# Patient Record
Sex: Male | Born: 1935 | Race: White | Hispanic: No | Marital: Married | Smoking: Former smoker
Health system: Southern US, Community
[De-identification: ages and names within clinical notes are randomized; demographics above are authoritative.]

## PROBLEM LIST (undated history)

## (undated) DIAGNOSIS — S065XAA Traumatic subdural hemorrhage with loss of consciousness status unknown, initial encounter: Secondary | ICD-10-CM

## (undated) DIAGNOSIS — M25511 Pain in right shoulder: Secondary | ICD-10-CM

## (undated) DIAGNOSIS — D696 Thrombocytopenia, unspecified: Secondary | ICD-10-CM

## (undated) DIAGNOSIS — M1990 Unspecified osteoarthritis, unspecified site: Secondary | ICD-10-CM

## (undated) DIAGNOSIS — I4891 Unspecified atrial fibrillation: Principal | ICD-10-CM

## (undated) DIAGNOSIS — M549 Dorsalgia, unspecified: Secondary | ICD-10-CM

## (undated) DIAGNOSIS — I639 Cerebral infarction, unspecified: Secondary | ICD-10-CM

## (undated) DIAGNOSIS — J449 Chronic obstructive pulmonary disease, unspecified: Secondary | ICD-10-CM

## (undated) DIAGNOSIS — I495 Sick sinus syndrome: Secondary | ICD-10-CM

## (undated) DIAGNOSIS — R079 Chest pain, unspecified: Secondary | ICD-10-CM

## (undated) DIAGNOSIS — M25512 Pain in left shoulder: Secondary | ICD-10-CM

## (undated) HISTORY — DX: Dorsalgia, unspecified: M54.9

## (undated) HISTORY — DX: Thrombocytopenia, unspecified: D69.6

## (undated) HISTORY — DX: Pain in right shoulder: M25.511

## (undated) HISTORY — DX: Sick sinus syndrome: I49.5

## (undated) HISTORY — DX: Pain in left shoulder: M25.512

## (undated) HISTORY — PX: PACEMAKER IMPLANT: EP1218

## (undated) HISTORY — PX: CATARACT EXTRACTION W/ INTRAOCULAR LENS IMPLANT: SHX1309

## (undated) HISTORY — PX: TOTAL HIP ARTHROPLASTY: SHX124

---

## 2012-05-15 ENCOUNTER — Encounter (HOSPITAL_BASED_OUTPATIENT_CLINIC_OR_DEPARTMENT_OTHER): Payer: Self-pay

## 2012-05-15 ENCOUNTER — Inpatient Hospital Stay (HOSPITAL_COMMUNITY): Payer: Medicare Other

## 2012-05-15 ENCOUNTER — Inpatient Hospital Stay (HOSPITAL_BASED_OUTPATIENT_CLINIC_OR_DEPARTMENT_OTHER)
Admission: EM | Admit: 2012-05-15 | Discharge: 2012-05-18 | DRG: 309 | Disposition: A | Discharge status: Home / Self Care | Payer: Medicare Other | Attending: Internal Medicine | Admitting: Internal Medicine

## 2012-05-15 ENCOUNTER — Emergency Department (HOSPITAL_BASED_OUTPATIENT_CLINIC_OR_DEPARTMENT_OTHER): Payer: Medicare Other

## 2012-05-15 DIAGNOSIS — R0781 Pleurodynia: Secondary | ICD-10-CM

## 2012-05-15 DIAGNOSIS — R7309 Other abnormal glucose: Secondary | ICD-10-CM | POA: Diagnosis present

## 2012-05-15 DIAGNOSIS — Z87891 Personal history of nicotine dependence: Secondary | ICD-10-CM

## 2012-05-15 DIAGNOSIS — N179 Acute kidney failure, unspecified: Secondary | ICD-10-CM | POA: Diagnosis present

## 2012-05-15 DIAGNOSIS — Z9849 Cataract extraction status, unspecified eye: Secondary | ICD-10-CM

## 2012-05-15 DIAGNOSIS — J449 Chronic obstructive pulmonary disease, unspecified: Secondary | ICD-10-CM | POA: Diagnosis present

## 2012-05-15 DIAGNOSIS — Z96649 Presence of unspecified artificial hip joint: Secondary | ICD-10-CM

## 2012-05-15 DIAGNOSIS — D696 Thrombocytopenia, unspecified: Secondary | ICD-10-CM | POA: Diagnosis present

## 2012-05-15 DIAGNOSIS — Z23 Encounter for immunization: Secondary | ICD-10-CM

## 2012-05-15 DIAGNOSIS — I517 Cardiomegaly: Secondary | ICD-10-CM

## 2012-05-15 DIAGNOSIS — J4489 Other specified chronic obstructive pulmonary disease: Secondary | ICD-10-CM | POA: Diagnosis present

## 2012-05-15 DIAGNOSIS — Z7982 Long term (current) use of aspirin: Secondary | ICD-10-CM

## 2012-05-15 DIAGNOSIS — M25519 Pain in unspecified shoulder: Secondary | ICD-10-CM

## 2012-05-15 DIAGNOSIS — M25512 Pain in left shoulder: Secondary | ICD-10-CM | POA: Diagnosis present

## 2012-05-15 DIAGNOSIS — M549 Dorsalgia, unspecified: Secondary | ICD-10-CM | POA: Diagnosis present

## 2012-05-15 DIAGNOSIS — M25511 Pain in right shoulder: Secondary | ICD-10-CM | POA: Diagnosis present

## 2012-05-15 DIAGNOSIS — Z961 Presence of intraocular lens: Secondary | ICD-10-CM

## 2012-05-15 DIAGNOSIS — D72829 Elevated white blood cell count, unspecified: Secondary | ICD-10-CM | POA: Diagnosis present

## 2012-05-15 DIAGNOSIS — I739 Peripheral vascular disease, unspecified: Secondary | ICD-10-CM | POA: Diagnosis present

## 2012-05-15 DIAGNOSIS — I4891 Unspecified atrial fibrillation: Secondary | ICD-10-CM | POA: Diagnosis present

## 2012-05-15 HISTORY — DX: Chronic obstructive pulmonary disease, unspecified: J44.9

## 2012-05-15 HISTORY — DX: Unspecified atrial fibrillation: I48.91

## 2012-05-15 HISTORY — DX: Unspecified osteoarthritis, unspecified site: M19.90

## 2012-05-15 LAB — CBC WITH DIFFERENTIAL/PLATELET
Basophils Absolute: 0 10*3/uL (ref 0.0–0.1)
Basophils Relative: 0 % (ref 0–1)
Eosinophils Absolute: 0.1 10*3/uL (ref 0.0–0.7)
Eosinophils Relative: 1 % (ref 0–5)
HCT: 44.8 % (ref 39.0–52.0)
Lymphocytes Relative: 15 % (ref 12–46)
MCHC: 34.8 g/dL (ref 30.0–36.0)
MCV: 95.3 fL (ref 78.0–100.0)
Monocytes Absolute: 1.1 10*3/uL — ABNORMAL HIGH (ref 0.1–1.0)
Platelets: 142 10*3/uL — ABNORMAL LOW (ref 150–400)
RDW: 13.2 % (ref 11.5–15.5)
WBC: 15.4 10*3/uL — ABNORMAL HIGH (ref 4.0–10.5)

## 2012-05-15 LAB — PROTIME-INR
INR: 1.02 (ref 0.00–1.49)
Prothrombin Time: 13.3 seconds (ref 11.6–15.2)

## 2012-05-15 LAB — URINALYSIS, ROUTINE W REFLEX MICROSCOPIC
Hgb urine dipstick: NEGATIVE
Nitrite: NEGATIVE
Protein, ur: NEGATIVE mg/dL
Specific Gravity, Urine: 1.021 (ref 1.005–1.030)
Urobilinogen, UA: 1 mg/dL (ref 0.0–1.0)

## 2012-05-15 LAB — CK TOTAL AND CKMB (NOT AT ARMC)
CK, MB: 3.6 ng/mL (ref 0.3–4.0)
Relative Index: 1.6 (ref 0.0–2.5)
Total CK: 230 U/L (ref 7–232)

## 2012-05-15 LAB — COMPREHENSIVE METABOLIC PANEL
ALT: 10 U/L (ref 0–53)
AST: 17 U/L (ref 0–37)
Albumin: 3.9 g/dL (ref 3.5–5.2)
Calcium: 9.1 mg/dL (ref 8.4–10.5)
Creatinine, Ser: 1.2 mg/dL (ref 0.50–1.35)
GFR calc non Af Amer: 52 mL/min — ABNORMAL LOW (ref >90)
Sodium: 138 mEq/L (ref 135–145)
Total Protein: 7.1 g/dL (ref 6.0–8.3)

## 2012-05-15 LAB — MAGNESIUM: Magnesium: 1.9 mg/dL (ref 1.5–2.5)

## 2012-05-15 LAB — MRSA PCR SCREENING: MRSA by PCR: NEGATIVE

## 2012-05-15 LAB — HEMOGLOBIN A1C: Hgb A1c MFr Bld: 5.6 % (ref <5.7)

## 2012-05-15 LAB — TROPONIN I: Troponin I: <0.3 ng/mL (ref <0.30)

## 2012-05-15 MED ORDER — IOHEXOL 350 MG/ML SOLN
100.0000 mL | Freq: Once | INTRAVENOUS | Status: AC | PRN
  Administered 2012-05-15: 100 mL via INTRAVENOUS

## 2012-05-15 MED ORDER — DILTIAZEM HCL 25 MG/5ML IV SOLN
20.0000 mg | Freq: Once | INTRAVENOUS | Status: AC
  Administered 2012-05-15: 20 mg via INTRAVENOUS
  Filled 2012-05-15: qty 5

## 2012-05-15 MED ORDER — ENOXAPARIN SODIUM 40 MG/0.4ML ~~LOC~~ SOLN
40.0000 mg | Freq: Every day | SUBCUTANEOUS | Status: DC
  Filled 2012-05-15: qty 0.4

## 2012-05-15 MED ORDER — DILTIAZEM HCL 100 MG IV SOLR
5.0000 mg/h | Freq: Once | INTRAVENOUS | Status: AC
  Administered 2012-05-15: 5 mg/h via INTRAVENOUS

## 2012-05-15 MED ORDER — DILTIAZEM HCL 100 MG IV SOLR
10.0000 mg/h | Freq: Once | INTRAVENOUS | Status: AC
  Administered 2012-05-15: 15 mg/h via INTRAVENOUS

## 2012-05-15 MED ORDER — METOPROLOL TARTRATE 25 MG PO TABS
25.0000 mg | ORAL_TABLET | Freq: Three times a day (TID) | ORAL | Status: DC
  Administered 2012-05-15: 25 mg via ORAL
  Filled 2012-05-15: qty 1

## 2012-05-15 MED ORDER — DILTIAZEM HCL 25 MG/5ML IV SOLN
INTRAVENOUS | Status: AC
  Filled 2012-05-15: qty 20

## 2012-05-15 MED ORDER — SODIUM CHLORIDE 0.9 % IV SOLN
INTRAVENOUS | Status: DC
  Administered 2012-05-15 – 2012-05-16 (×2): via INTRAVENOUS

## 2012-05-15 MED ORDER — ACETAMINOPHEN 325 MG PO TABS
650.0000 mg | ORAL_TABLET | Freq: Four times a day (QID) | ORAL | Status: DC | PRN
  Administered 2012-05-15: 650 mg via ORAL
  Filled 2012-05-15: qty 2

## 2012-05-15 MED ORDER — IBUPROFEN 600 MG PO TABS
600.0000 mg | ORAL_TABLET | Freq: Four times a day (QID) | ORAL | Status: DC | PRN
  Administered 2012-05-15: 600 mg via ORAL
  Filled 2012-05-15: qty 1

## 2012-05-15 MED ORDER — DILTIAZEM HCL 25 MG/5ML IV SOLN
25.0000 mg | Freq: Once | INTRAVENOUS | Status: DC
  Filled 2012-05-15: qty 5

## 2012-05-15 MED ORDER — ASPIRIN EC 325 MG PO TBEC
325.0000 mg | DELAYED_RELEASE_TABLET | Freq: Every day | ORAL | Status: DC
  Administered 2012-05-15: 325 mg via ORAL
  Filled 2012-05-15: qty 1

## 2012-05-15 MED ORDER — BIOTENE DRY MOUTH MT LIQD
15.0000 mL | Freq: Two times a day (BID) | OROMUCOSAL | Status: DC
  Administered 2012-05-15 – 2012-05-18 (×7): 15 mL via OROMUCOSAL

## 2012-05-15 MED ORDER — APIXABAN 2.5 MG PO TABS
2.5000 mg | ORAL_TABLET | Freq: Two times a day (BID) | ORAL | Status: DC
  Administered 2012-05-15 – 2012-05-18 (×7): 2.5 mg via ORAL
  Filled 2012-05-15 (×8): qty 1

## 2012-05-15 MED ORDER — ONDANSETRON HCL 4 MG PO TABS: 4.0000 mg | ORAL_TABLET | Freq: Four times a day (QID) | ORAL | Status: DC | PRN

## 2012-05-15 MED ORDER — DILTIAZEM HCL 100 MG IV SOLR
5.0000 mg/h | INTRAVENOUS | Status: DC
  Filled 2012-05-15: qty 100

## 2012-05-15 MED ORDER — SODIUM CHLORIDE 0.9 % IJ SOLN
3.0000 mL | Freq: Two times a day (BID) | INTRAMUSCULAR | Status: DC
  Administered 2012-05-15 – 2012-05-18 (×6): 3 mL via INTRAVENOUS

## 2012-05-15 MED ORDER — METOPROLOL TARTRATE 12.5 MG HALF TABLET
12.5000 mg | ORAL_TABLET | Freq: Three times a day (TID) | ORAL | Status: DC
  Administered 2012-05-15 – 2012-05-18 (×10): 12.5 mg via ORAL
  Filled 2012-05-15 (×11): qty 1

## 2012-05-15 MED ORDER — ACETAMINOPHEN 650 MG RE SUPP: 650.0000 mg | Freq: Four times a day (QID) | RECTAL | Status: DC | PRN

## 2012-05-15 MED ORDER — PANTOPRAZOLE SODIUM 40 MG PO TBEC
40.0000 mg | DELAYED_RELEASE_TABLET | Freq: Every day | ORAL | Status: DC
  Administered 2012-05-15 – 2012-05-18 (×4): 40 mg via ORAL
  Filled 2012-05-15 (×4): qty 1

## 2012-05-15 MED ORDER — DILTIAZEM HCL 25 MG/5ML IV SOLN
25.0000 mg | Freq: Once | INTRAVENOUS | Status: AC
  Administered 2012-05-15: 25 mg via INTRAVENOUS

## 2012-05-15 MED ORDER — METOPROLOL TARTRATE 25 MG/10 ML ORAL SUSPENSION
25.0000 mg | Freq: Three times a day (TID) | ORAL | Status: DC
  Filled 2012-05-15 (×4): qty 10

## 2012-05-15 MED ORDER — ONDANSETRON HCL 4 MG/2ML IJ SOLN: 4.0000 mg | Freq: Four times a day (QID) | INTRAMUSCULAR | Status: DC | PRN

## 2012-05-15 MED ORDER — ALBUTEROL SULFATE (5 MG/ML) 0.5% IN NEBU: 2.5000 mg | INHALATION_SOLUTION | RESPIRATORY_TRACT | Status: DC | PRN

## 2012-05-15 MED ORDER — OXYCODONE HCL 5 MG PO TABS
5.0000 mg | ORAL_TABLET | ORAL | Status: DC | PRN
  Administered 2012-05-15: 5 mg via ORAL
  Filled 2012-05-15 (×2): qty 1

## 2012-05-15 MED ORDER — INFLUENZA VIRUS VACC SPLIT PF IM SUSP
0.5000 mL | INTRAMUSCULAR | Status: AC
  Administered 2012-05-16: 0.5 mL via INTRAMUSCULAR
  Filled 2012-05-15: qty 0.5

## 2012-05-15 MED ORDER — ONDANSETRON HCL 4 MG/2ML IJ SOLN: 4.0000 mg | Freq: Three times a day (TID) | INTRAMUSCULAR | Status: DC | PRN

## 2012-05-15 MED ORDER — DILTIAZEM HCL 100 MG IV SOLR
10.0000 mg/h | Freq: Once | INTRAVENOUS | Status: AC
  Administered 2012-05-15: 10 mg/h via INTRAVENOUS

## 2012-05-15 NOTE — ED Notes (Signed)
PCXR being completed, patients son remains at bedside

## 2012-05-15 NOTE — Consult Note (Signed)
CARDIOLOGY CONSULT NOTE  Patient ID: Justin Baker, MRN: [id number], DOB/AGE: [birth year] 78 y.o. Admit date: 05/15/2012 Date of Consult: 05/15/2012  Primary Physician: None Primary Cardiologist: None  Chief Complaint: shoulder/back pain Reason for Consultation: A.fib & shoulder/back pain  HPI: 78 y.o. male w/ osteoarthritis, no cardiac history, who presented to [facility] on 05/15/2012 with complaints of shoulder/back pain and was found to be in A.fib w/ rvr for which he was transferred to [facility].  He reports being in good health and not requiring regular medical care for many years. Quit smoking ~74yrs ago and has self diagnosed COPD. Other than COPD, cataract and right hip surgery, has not had any health problems. Reports having mild chronic sob. Is very active around the house/yard and able to walk and climb stairs without chest pain. Saturday (9/21) night woke up with pain in his shoulder blades and across his upper back with radiation into his right neck. When he got up to go to the bathroom he reported feel more tired and weak and felt his heart racing. Sunday the shoulder/back pain continued, but did not feel any more palpitations. The shoulder/back pain was worse with movement and with deep inspiration. No chest pain, nausea, dizziness, or syncope. No worsening of baseline sob. He did more physical labor including working on his lawnmower on Thursday. No recent surgery, prolonged immobility, or extended travel. Denies calf pain/swelling. No recent illness, fever, chills, cough, orthopnea, PND, edema, abd pain, melena/hematochezia, change in bladder or bowels. (+) snoring. Drinks 2-3cups caffeinated coffee/day. (-) alcohol  At [facility], EKG showed Atrial fibrillation 153bpm, nonspecific ST/T changes. CXR showed interstitial pulmonary edema and mild CHF. Labs are significant for normal troponin, WBC 15.4, Plts 142, glucose 140, otherwise unremarkable CBC/CMET, and normal  UA. He was given IV cardizem and placed on a drip with improved rates. 90-110s on telemetry. SBPs have ranged 110s-180s. Currently with mild mid back pain worse with deep inspiration and movement.  Past Medical History  Diagnosis Date  . COPD (chronic obstructive pulmonary disease)     self diagnosed  . Osteoarthritis     s/p right total hip      Surgical History:  Past Surgical History  Procedure Date  . Total hip arthroplasty     right  . Cataract extraction w/ intraocular lens implant     right     Home Meds: Medication Sig  aspirin 325 MG EC tablet Take 650 mg by mouth daily.    Inpatient Medications:   . antiseptic oral rinse  15 mL Mouth Rinse BID  . aspirin  325 mg Oral Daily  . diltiazem (CARDIZEM) infusion  5-15 mg/hr Intravenous Once  . diltiazem (CARDIZEM) infusion  10 mg/hr Intravenous Once  . diltiazem (CARDIZEM) infusion  10-15 mg/hr Intravenous Once  . diltiazem  20 mg Intravenous Once  . diltiazem  25 mg Intravenous Once  . enoxaparin (LOVENOX) injection  40 mg Subcutaneous Daily  . influenza  inactive virus vaccine  0.5 mL Intramuscular Tomorrow-1000  . sodium chloride  3 mL Intravenous Q12H  . DISCONTD: diltiazem  25 mg Intravenous Once   . sodium chloride 10 mL/hr at 05/15/12 0635  . diltiazem (CARDIZEM) infusion      Allergies: No Known Allergies  History   Social History  . Marital Status: Married    Spouse Name: [name]    Number of Children: [name]  . Years of Education: [name]   Occupational History  . Not on  file.   Social History Main Topics  . Smoking status: Former Games developer  . Smokeless tobacco: Former Neurosurgeon    Quit date: 08/23/1976  . Alcohol Use: No  . Drug Use: No  . Sexually Active: Not on file   Other Topics Concern  . Not on file   Social History Narrative  . No narrative on file     Family History  Problem Relation Age of Onset  . Other      No known heart disease     Review of Systems: General: negative for chills,  fever, night sweats or weight changes.  Cardiovascular: (+) shortness of breath, palpitations; negative for chest pain, dyspnea on exertion, edema, orthopnea, or paroxysmal nocturnal dyspnea Dermatological: negative for rash Musculoskeletal: (+) shoulder/back pain, left hip pain Respiratory: negative for cough or wheezing Urologic: negative for hematuria Abdominal: negative for nausea, vomiting, diarrhea, bright red blood per rectum, melena, or hematemesis Neurologic: negative for visual changes, syncope, or dizziness All other systems reviewed and are otherwise negative except as noted above.  Labs:  [facility] 05/15/12 0215  TROPONINI <0.30   Component Value Date   WBC 15.4* 05/15/2012   HGB 15.6 05/15/2012   HCT 44.8 05/15/2012   MCV 95.3 05/15/2012   PLT 142* 05/15/2012     05/15/2012 02:15  Prothrombin Time 13.3  INR 1.02   Lab 05/15/12 0215  NA 138  K 4.3  CL 104  CO2 21  BUN 21  CREATININE 1.20  CALCIUM 9.1  PROT 7.1  BILITOT 1.0  ALKPHOS 85  ALT 10  AST 17  GLUCOSE 140*   Radiology/Studies:   05/15/2012 - Chest Port 1 View Findings: Patient is rotated to the right.  Cardiopericardial silhouette is within normal limits for projection. There is interstitial prominence diffusely which although nonspecific, can be associated with interstitial pulmonary edema and mild CHF. Atypical infection considered less likely. Monitoring leads are projected over the chest.  No focal consolidation.  Mild basilar atelectasis.  IMPRESSION: Interstitial prominence suggesting interstitial pulmonary edema and mild CHF.       EKG: 05/15/12 @ 0248 - Atrial Fibrillation 153bpm, nonspecific ST/T changes  05/15/12 @ 0834 - Atrial Fibrillation 113bpm, nonspecific ST/T changes  Physical Exam: Blood pressure 159/28, pulse 86, temperature 98.6 F (37 C), temperature source Oral, resp. rate 22, height 5\' 10"  (1.778 m), weight 204 lb 2.3 oz (92.6 kg), SpO2 97.00%. General: Well developed, well  nourished, elderly white male in no acute distress. Head: Normocephalic, atraumatic, sclera non-icteric, no xanthomas, nares are without discharge.  Neck: Supple. Negative for carotid bruits. JVD not elevated. Lungs: Fine bibasilar ralse. No wheeze or rhonchi. Breathing is unlabored. Heart: Irregularly Irregular with S1 S2. No murmurs, rubs, or gallops appreciated. Abdomen: Soft, non-tender, non-distended with normoactive bowel sounds. No hepatomegaly. No rebound/guarding. No obvious abdominal masses. Msk:  Tender to palpation in between shoulder blades and mid back. Strength and tone appear normal for age. Extremities: No clubbing or cyanosis. No edema.  Distal pedal pulses are intact and equal bilaterally. Neuro: Alert and oriented X 3. Moves all extremities spontaneously. Psych:  Responds to questions appropriately with a normal affect.   Assessment and Plan:  78 y.o. male w/ osteoarthritis, no cardiac history, who presented to [facility] on 05/15/2012 with complaints of shoulder/back pain and was found to be in A.fib w/ rvr for which he was transferred to [facility].  1. Shoulder/back pain: Patient presents with pain in between his shoulder blades and  now mid back >48hrs. No prior cardiac history. Risk factors include age and h/o tobacco abuse. First troponin normal and EKG without acute ischemic changes. Do not suspect ACS at this point. Will cont to cycle enzymes and check echo. If enzymes turn positive or new WMAs or significantly low EF on echo, would need further ischemic evaluation. Check lipid panel and A1c for risk stratification. Likely musculoskeletal given recent increased physical activity and reproducibility. Will use ibuprofen and heat pack. Add PPI for GI protection. There is a pleuritic component, but no risk factors for PE. Will await DDimer result. If (+) would need further workup for DVT/PE as this could explain onset of a.fib.  2. Atrial Fibrillation w/ RVR:  No prior history. Unsure of timing of onset, but possibly Saturday night. Currently rate controlled on Cardizem and hemodynamically stable. Will start Lopressor 25mg  Q8H then stop cardizem. CHADS2 score 1 for age, but has a couple elevated BP readings and pending echo and A1c, so this may change. Will anticoagulate with Eliquis, 2.5mg  bid due to age >37, and stop Lovenox and ASA. Check Echo, Mg, and TSH. If he does not convert on his own can consider DCCV after anticoagulated for 3-4wks.  3. Leukocytosis: WBC 15.4. No signs or symptoms of infection. CXR without infiltrate and UA normal. Check CBC in am.  4. Thrombocytopenia: Plts 142. No signs of active bleeding. CBC in am.  5. Hyperglycemia: Glucose 140. Check A1c  6. ?COPD: No official diagnosis. No wheezing on exam. Management per primary team  7. ?OSA: Reports snoring. May benefit from outpatient sleep study given new onset a.fib.   Signed, HOPE, JESSICA PA-C 05/15/2012, 9:43 AM   Patient examined chart reviewed.  Discussed diagnosis and plan of care with patient and family.  Start Eliquis.  Check echo D/C iv cardizem and start lopressor.  Pain is muscular/pleuritic and non anginal sounding.  Initial enzymes negative.  Rate control and D/C on Eliquis for consideration of DCC in 3 weeks if he doesn't convert.  If Echo abnormal may need further inpatient w/u  [name] 10:23 AM

## 2012-05-15 NOTE — H&P (Signed)
[facility] History and Physical  Kais Alberta ZOX:[id number] DOB: June 20, 1922 DOA: 05/15/2012  Referring physician: Dione Booze, MD PCP: No primary provider on file.   Chief Complaint: Back pain, both shoulder pain and palpitations.  HPI: Justin Baker is a 78 y.o. male with history of PVD, self diagnosed COPD who is in great health and has not seen an M.D. for years presents to the ED with both shoulder pains, upper mid back pain and palpitations. He initially presented to the [facility] and was transferred to [facility] step down unit. He was in his usual state of health until 2 nights ago when he woke up with pain in both shoulders and upper mid back. He denies history of direct trauma or lifting anything unusual. Pain was made worse by deep inspiration and some chest wall movements. He denied anterior chest pain and complains of difficulty taking a deep breath secondary to pain. He denies cough, fever or chills. The pain improved spontaneously but had it mildly yesterday. He went to bed last night but woke up again at approximately 2 AM with worsening similar pains but this time associated with palpitations and some lightheadedness and unsteadiness. His son drove him to med [facility] where he was found to have A. fib with RVR. Chest x-ray and troponin times one were negative. He was started on Cardizem drip with good effect and was transferred to [facility] [place]. Patient currently denies palpitations but still complains of about pains. He denies any long distance travel. He remembers one episode of palpitations approximately 15 years ago which lasted for a day and then resolved spontaneously.   Review of Systems: All systems reviewed and apart from history of presenting illness are pertinent for chronic left hip pain for which he takes 650 mg of aspirin daily. He also complains of some intermittent numbness and cold sensation of both feet. Otherwise negative.  Past  Medical History  Diagnosis Date  . COPD (chronic obstructive pulmonary disease)   . PVD (peripheral vascular disease)    Past Surgical History  Procedure Date  . Total hip arthroplasty   . Cataract extraction w/ intraocular lens implant    Social History:  reports that he has quit smoking. He does not have any smokeless tobacco history on file. His alcohol and drug histories not on file. Patient is married and lives with his spouse. He is independent of activities of daily living. He quit smoking 35-40 years ago. No history of alcohol or drug abuse.  No Known Allergies  No family history on file. one of patient's sons has myasthenia gravis.  Prior to Admission medications   Medication Sig Start Date End Date Taking? Authorizing Provider  aspirin 325 MG EC tablet Take 325 mg by mouth daily.   Yes Historical Provider, MD   Physical Exam: Filed Vitals:   05/15/12 0645 05/15/12 0700 05/15/12 0715 05/15/12 0800  BP: 130/64 124/60 128/58 134/73  Pulse: 76 87 84 68  Temp:    98.6 F (37 C)  TempSrc:    Oral  Resp: 24 21 24 21   Height:      Weight:      SpO2: 98% 98% 97% 97%     General exam: Moderately built and nourished male patient, looks younger than stated age, pleasant and comfortable, lying in bed.  Head, eyes and ENT: Nontraumatic and normocephalic. Pupils equally reacting to light and accommodation. Right eye has intraocular lens. Left eye has near mature cataract. Oral  mucosa is moist.  Neck: Supple. No JVD, carotid bruit or thyromegaly.  Lymphatics: No lymphadenopathy.  Respiratory system: Clear to auscultation. No increased work of breathing.  Cardiovascular system: S1 and S2 heard, irregularly irregular. No JVD, murmurs, gallops or pedal edema.  Gastrointestinal system: Abdomen is nondistended, soft and nontender. Normal bowel sounds heard. No organomegaly or masses appreciated.  Central nervous system: Alert and oriented. No focal neurological  deficits.  Skin: Negative exam.  Extremities: Symmetric 5 x 5 power. Peripheral pulses symmetrically felt. Mild pain full range of movements of the left hip.  Musculoskeletal system: Mildly painful range of movements of bilateral shoulders but not similar to current pain. Minimal tenderness bilateral upper intrascapular region which reproduces some of the pain that he has but not all. No other acute findings.  Psychiatry: Pleasant and cooperative and appropriate affect.   Labs on Admission:  Basic Metabolic Panel:  Lab 05/15/12 0102  NA 138  K 4.3  CL 104  CO2 21  GLUCOSE 140*  BUN 21  CREATININE 1.20  CALCIUM 9.1  MG --  PHOS --   Liver Function Tests:  Lab 05/15/12 0215  AST 17  ALT 10  ALKPHOS 85  BILITOT 1.0  PROT 7.1  ALBUMIN 3.9   No results found for this basename: LIPASE:5,AMYLASE:5 in the last 168 hours No results found for this basename: AMMONIA:5 in the last 168 hours CBC:  Lab 05/15/12 0215  WBC 15.4*  NEUTROABS 11.8*  HGB 15.6  HCT 44.8  MCV 95.3  PLT 142*   Cardiac Enzymes:  Lab 05/15/12 0215  CKTOTAL --  CKMB --  CKMBINDEX --  TROPONINI <0.30    BNP (last 3 results) No results found for this basename: PROBNP:3 in the last 8760 hours CBG: No results found for this basename: GLUCAP:5 in the last 168 hours  Radiological Exams on Admission: Dg Chest Port 1 View  05/15/2012  *RADIOLOGY REPORT*  Clinical Data: Short of breath.  Pain between shoulder blades.  PORTABLE CHEST - 1 VIEW  Comparison: None.  Findings: Patient is rotated to the right.  Cardiopericardial silhouette is within normal limits for projection. There is interstitial prominence diffusely which although nonspecific, can be associated with interstitial pulmonary edema and mild CHF. Atypical infection considered less likely. Monitoring leads are projected over the chest.  No focal consolidation.  Mild basilar atelectasis.  IMPRESSION: Interstitial prominence suggesting  interstitial pulmonary edema and mild CHF.   Original Report Authenticated By: Andreas Newport, M.D.     EKG: Independently reviewed. 9/23 at 2:48 AM: A. fib with RVR at 153 beats per minute,? LAD and no acute changes. QTC 459 ms.  Assessment/Plan Active Problems:  Atrial fibrillation with rapid ventricular response  Back pain  Shoulder pain, bilateral  Leukocytosis  Thrombocytopenia   1. Atrial fibrillation with rapid ventricular rate: Continue to monitor on telemetry in stepdown unit. Continue IV Cardizem. Ventricular rate is reasonably controlled. Consider changing to oral Cardizem later today or in a.m. Check cardiac enzymes, TSH, d-dimer and 2-D echocardiogram to evaluate for etiology. Patient's CHADS 2 score is 1. Continue aspirin 325 mg daily. [facility] cardiology consulted. 2. Bilateral shoulders and upper back pain:? Musculoskeletal etiology versus atypical anginal pain: EKG without acute findings. Cycle cardiac enzymes and check d-dimer. Continue aspirin. [facility] cardiology consulted. 3. Leukocytosis: Probably a stress response. No clinical focus of infection. UA and chest x-ray negative for infection. Follow CBC in a.m. 4. Mild thrombocytopenia: Unclear etiology. Follow CBC in a.m. 5. Possible COPD:  Stable. When necessary bronchodilators nebs.  Code Status: Full Family Communication: Discussed directly with patient. Disposition Plan: Home  Time spent: 65 minutes  [facility] Pager [phone]  If 7PM-7AM, please contact night-coverage [email or web address] Password [facility] 05/15/2012, 8:42 AM

## 2012-05-15 NOTE — Progress Notes (Signed)
  Echocardiogram 2D Echocardiogram has been performed.  [place] 05/15/2012, 12:20 PM

## 2012-05-15 NOTE — ED Notes (Signed)
Report to [facility] and attempted report x 1 to [facility]

## 2012-05-15 NOTE — ED Notes (Signed)
Patient here with shortness of breath and pain between shoulder blades, reports pain to neck as well. No acute distress on assessment.

## 2012-05-15 NOTE — ED Notes (Signed)
[facility] here to transport to [facility] 

## 2012-05-15 NOTE — ED Notes (Signed)
Patient continues to deny chest pain. Cardizem infusion at 5mg 

## 2012-05-15 NOTE — ED Notes (Signed)
2nd cardizem bolus given as ordered. Patient remains pain free. HR varies from 70's-90's. cardizem drip continues to infuse at 10mg /hr.

## 2012-05-15 NOTE — Care Management Note (Addendum)
    Page 1 of 1   05/15/2012     12:57:54 PM   CARE MANAGEMENT NOTE 05/15/2012  Patient:  Justin Baker, Justin Baker   Account Number:  [id number]  Date Initiated:  05/15/2012  Documentation initiated by:  Junius Creamer  Subjective/Objective Assessment:   adm w ch pain, at fib w rvr     Action/Plan:   lives w wife   Anticipated DC Date:     Anticipated DC Plan:        DC Planning Services  CM consult      Choice offered to / List presented to:             Status of service:   Medicare Important Message given?   (If response is "NO", the following Medicare IM given date fields will be blank) Date Medicare IM given:   Date Additional Medicare IM given:    Discharge Disposition:    Per UR Regulation:  Reviewed for med. necessity/level of care/duration of stay  If discussed at Long Length of Stay Meetings, dates discussed:    Comments:  9/23 9:20a debbie Serene Kopf rn,bsn [phone] eliquis has 70.00 copay for 20days of meds. pt can call and request lower tier and they may reduce tier. eliquis is tier 4 w blue medicare. if needs 30days will need override for extra doses.

## 2012-05-15 NOTE — ED Notes (Signed)
Assigned bed 2911 @ [facility], RN aware, [facility] called for transport.

## 2012-05-15 NOTE — ED Notes (Signed)
MD at bedside. 

## 2012-05-15 NOTE — ED Provider Notes (Signed)
History     CSN: [id number]  Arrival date & time 05/15/12  4098   First MD Initiated Contact with Patient 05/15/12 0241      No chief complaint on file.   (Consider location/radiation/quality/duration/timing/severity/associated sxs/prior treatment) Patient is a 78 y.o. male presenting with chest pain. The history is provided by the patient.  Chest Pain   He  Noted onset this evening of pain in the chest when he took a deep breath. He states that it felt like he would feel if he broke a rib. Pain was moderate in intensity but he could not put a number on it. Nothing made it better. Nothing made it worse except for trying to take a deep breath. He denies dyspnea, nausea, vomiting, diaphoresis. He denies fever or chills. He denies palpitations. He's never had similar symptoms before. He does take aspirin 650 mg once a day. He also complains of some pain in his right shoulder radiating up to his neck when he moves his right arm. This is an ongoing complaint and not new for today.  No past medical history on file.  No past surgical history on file.  No family history on file.  History  Substance Use Topics  . Smoking status: Not on file  . Smokeless tobacco: Not on file  . Alcohol Use: Not on file      Review of Systems  Cardiovascular: Positive for chest pain.  All other systems reviewed and are negative.    Allergies  Review of patient's allergies indicates not on file.  Home Medications  No current outpatient prescriptions on file.  There were no vitals taken for this visit.  Physical Exam  Nursing note and vitals reviewed. 78year old male, resting comfortably and in no acute distress. Vital signs are 153. Oxygen saturation is 97%, which is normal. Head is normocephalic and atraumatic. PERRLA, EOMI. Oropharynx is clear. Neck is nontender and supple without adenopathy or JVD. Back is nontender and there is no CVA tenderness. Lungs are clear without rales, wheezes,  or rhonchi. Chest is nontender. Heart is tachycardic and irregular without murmur. Abdomen is soft, flat, nontender without masses or hepatosplenomegaly and peristalsis is normoactive. Extremities have no cyanosis, full range of motion is present. There is 1+ pitting edema present. Skin is warm and dry without rash. Neurologic: Mental status is normal, cranial nerves are intact, there are no motor or sensory deficits.   ED Course  Procedures (including critical care time)  Results for orders placed during the hospital encounter of 05/15/12  CBC WITH DIFFERENTIAL      Component Value Range   WBC 15.4 (*) 4.0 - 10.5 K/uL   RBC 4.70  4.22 - 5.81 MIL/uL   Hemoglobin 15.6  13.0 - 17.0 g/dL   HCT 11.9  14.7 - 82.9 %   MCV 95.3  78.0 - 100.0 fL   MCH 33.2  26.0 - 34.0 pg   MCHC 34.8  30.0 - 36.0 g/dL   RDW 56.2  13.0 - 86.5 %   Platelets 142 (*) 150 - 400 K/uL   Neutrophils Relative 77  43 - 77 %   Neutro Abs 11.8 (*) 1.7 - 7.7 K/uL   Lymphocytes Relative 15  12 - 46 %   Lymphs Abs 2.4  0.7 - 4.0 K/uL   Monocytes Relative 7  3 - 12 %   Monocytes Absolute 1.1 (*) 0.1 - 1.0 K/uL   Eosinophils Relative 1  0 - 5 %  Eosinophils Absolute 0.1  0.0 - 0.7 K/uL   Basophils Relative 0  0 - 1 %   Basophils Absolute 0.0  0.0 - 0.1 K/uL  COMPREHENSIVE METABOLIC PANEL      Component Value Range   Sodium 138  135 - 145 mEq/L   Potassium 4.3  3.5 - 5.1 mEq/L   Chloride 104  96 - 112 mEq/L   CO2 21  19 - 32 mEq/L   Glucose, Bld 140 (*) 70 - 99 mg/dL   BUN 21  6 - 23 mg/dL   Creatinine, Ser 1.61  0.50 - 1.35 mg/dL   Calcium 9.1  8.4 - 09.6 mg/dL   Total Protein 7.1  6.0 - 8.3 g/dL   Albumin 3.9  3.5 - 5.2 g/dL   AST 17  0 - 37 U/L   ALT 10  0 - 53 U/L   Alkaline Phosphatase 85  39 - 117 U/L   Total Bilirubin 1.0  0.3 - 1.2 mg/dL   GFR calc non Af Amer 52 (*) >90 mL/min   GFR calc Af Amer 60 (*) >90 mL/min  APTT      Component Value Range   aPTT 33  24 - 37 seconds  PROTIME-INR       Component Value Range   Prothrombin Time 13.3  11.6 - 15.2 seconds   INR 1.02  0.00 - 1.49  TROPONIN I      Component Value Range   Troponin I <0.30  <0.30 ng/mL   Dg Chest Port 1 View  05/15/2012  *RADIOLOGY REPORT*  Clinical Data: Short of breath.  Pain between shoulder blades.  PORTABLE CHEST - 1 VIEW  Comparison: None.  Findings: Patient is rotated to the right.  Cardiopericardial silhouette is within normal limits for projection. There is interstitial prominence diffusely which although nonspecific, can be associated with interstitial pulmonary edema and mild CHF. Atypical infection considered less likely. Monitoring leads are projected over the chest.  No focal consolidation.  Mild basilar atelectasis.  IMPRESSION: Interstitial prominence suggesting interstitial pulmonary edema and mild CHF.   Original Report Authenticated By: Andreas Newport, M.D.       Date: 05/15/2012  Rate: 153  Rhythm: atrial fibrillation  QRS Axis: normal  Intervals: normal  ST/T Wave abnormalities: nonspecific T wave changes  Conduction Disutrbances:none  Narrative Interpretation: Atrial fibrillation with rapid ventricular response, low QRS voltage, minor nonspecific T wave flattening diffusely. No prior ECG available for comparison.  Old EKG Reviewed: none available    1. Atrial fibrillation with rapid ventricular response   2. Pleuritic chest pain    CRITICAL CARE Performed by: EAVWU,[email or web address]   Total critical care time: 40 minutes  Critical care time was exclusive of separately billable procedures and treating other patients.  Critical care was necessary to treat or prevent imminent or life-threatening deterioration.  Critical care was time spent personally by me on the following activities: development of treatment plan with patient and/or surrogate as well as nursing, discussions with consultants, evaluation of patient's response to treatment, examination of patient, obtaining history from patient or  surrogate, ordering and performing treatments and interventions, ordering and review of laboratory studies, ordering and review of radiographic studies, pulse oximetry and re-evaluation of patient's condition.    MDM  Atrial fibrillation with rapid ventricular response. Pleuritic chest pain which is probably related to the atrial fibrillation. Neck and shoulder pain which is probably related to degenerative arthritis. He'll be given to diltiazem for rate control. He has  already taken aspirin as an additional aspirin is given. His CHADS2 score is one which puts him at low risk for embolic event, so he will not be anticoagulated immediately. He was not aware of his atrial fibrillation, so the duration is uncertain and he is not a candidate for flecainide conversion because of possibility of atrial thrombus and possible embolic event with conversion. No recent ED hospital records are available for him.  0340: After an initial bolus of diltiazem and placing him on a diltiazem drip, heart rate has come down to 115-125, and there has been improvement in his chest pain with deep breathing. He'll be given a repeat diltiazem bolus and diltiazem drip rate to be increased. He will need to be transferred to [facility].  0430: Heart rate has decreased to 80-90 after repeat diltiazem bolus and increase in diltiazem drip rate. Arrangements are made for hospital admission. Case is discussed with Dr. Toniann Fail of [facility] who agrees to accept the patient in transfer.  Dione Booze, MD 05/15/12 [phone]

## 2012-05-16 DIAGNOSIS — D696 Thrombocytopenia, unspecified: Secondary | ICD-10-CM

## 2012-05-16 DIAGNOSIS — N179 Acute kidney failure, unspecified: Secondary | ICD-10-CM | POA: Diagnosis present

## 2012-05-16 HISTORY — DX: Acute kidney failure, unspecified: N17.9

## 2012-05-16 LAB — LIPID PANEL
Cholesterol: 109 mg/dL (ref 0–200)
Total CHOL/HDL Ratio: 2.6 RATIO
Triglycerides: 65 mg/dL (ref <150)

## 2012-05-16 LAB — BASIC METABOLIC PANEL
Chloride: 106 mEq/L (ref 96–112)
Creatinine, Ser: 1.41 mg/dL — ABNORMAL HIGH (ref 0.50–1.35)
GFR calc Af Amer: 50 mL/min — ABNORMAL LOW (ref >90)
Sodium: 139 mEq/L (ref 135–145)

## 2012-05-16 LAB — CBC
MCV: 97.5 fL (ref 78.0–100.0)
Platelets: 117 10*3/uL — ABNORMAL LOW (ref 150–400)
RDW: 13.5 % (ref 11.5–15.5)
WBC: 14 10*3/uL — ABNORMAL HIGH (ref 4.0–10.5)

## 2012-05-16 MED ORDER — SODIUM CHLORIDE 0.9 % IV SOLN: INTRAVENOUS | Status: DC

## 2012-05-16 NOTE — Progress Notes (Addendum)
[facility] PROGRESS NOTE  Nichlaus Lockaby ZOX:[id number] DOB: [birth year] DOA: 05/15/2012 PCP: No primary provider on file.  Assessment/Plan: Active Problems:  Atrial fibrillation with rapid ventricular response Rate control has been an issue due to significant hypotension; cardiology plans for D/C cardioversion tomorrow. Pt on Eliquis. Cardiology also recommends outpt stress test after [facility].     Back pain and Shoulder pain, bilateral Angina equivalent due to rapidi a-fib and now resolved.    Leukocytosis Still no focus of infection found but WBC count is slightly improved- possible stress demargination?  cont to monitor.    Thrombocytopenia Platelets noted to have dropped from yesterday- etiology uncertain- no prior blood work to compare with-  will repeat blood work in AM.   ARF Suspect this is due to poor cardiac output (from rapid heart rate) and due to hypotension and poor perfusion of kidneys- will follow - hold off on IVF as due to uncontrolled HR, he is a high risk for developing pulmonary edema.    Code Status: full code Disposition Plan: follow in SDU  DVT prophylaxis: Eliquis   Brief narrative: Justin Baker is a 78 y.o. male with history of PVD, self diagnosed COPD who is in great health and has not seen an M.D. for years presents to the ED with both shoulder pains, upper mid back pain and palpitations. He initially presented to the [facility] and was transferred to [facility] cone step down unit. He was in his usual state of health until 2 nights ago when he woke up with pain in both shoulders and upper mid back. He denies history of direct trauma or lifting anything unusual. Pain was made worse by deep inspiration and some chest wall movements. He denied anterior chest pain and complains of difficulty taking a deep breath secondary to pain. He denies cough, fever or chills. The pain improved spontaneously but had it mildly yesterday. He went to bed last night but woke  up again at approximately 2 AM with worsening similar pains but this time associated with palpitations and some lightheadedness and unsteadiness. His son drove him to med [employer] where he was found to have A. fib with RVR. Chest x-ray and troponin times one were negative. He was started on Cardizem drip with good effect and was transferred to [facility]. Patient currently denies palpitations but still complains of about pains. He denies any long distance travel. He remembers one episode of palpitations approximately 15 years ago which lasted for a day and then resolved spontaneously.   Consultants:  [facility]  Procedures:  none  Antibiotics:  none  HPI/Subjective: Pt sitting up in chair- HR noted to be 130 but patient is without complaints of dyspnea, palpitations or chest pain. I have answered his question in regards to the planned D/C Cardioversion and being on long term blood thinners.   Objective: Filed Vitals:   05/16/12 0129 05/16/12 0601 05/16/12 0800 05/16/12 1220  BP: 108/54 98/55 95/47  111/84  Pulse:    110  Temp: 97.9 F (36.6 C) 97.5 F (36.4 C) 98.2 F (36.8 C) 98.3 F (36.8 C)  TempSrc: Oral Oral Oral Oral  Resp: 16 18 18 18   Height:      Weight:      SpO2: 96% 93% 95% 94%    Intake/Output Summary (Last 24 hours) at 05/16/12 1542 Last data filed at 05/16/12 0857  Gross per 24 hour  Intake    480 ml  Output  0 ml  Net    480 ml    Exam:   General:  Alert, sitting in chair, no distress  Cardiovascular: IRR, no murmurs  Respiratory: CTA b/l   Abdomen: soft, NT, ND, BS=  Ext: no c/c/e  Data Reviewed: Basic Metabolic Panel:  Lab 05/16/12 1610 05/15/12 0904 05/15/12 0215  NA 139 -- 138  K 4.2 -- 4.3  CL 106 -- 104  CO2 21 -- 21  GLUCOSE 107* -- 140*  BUN 28* -- 21  CREATININE 1.41* -- 1.20  CALCIUM 8.9 -- 9.1  MG -- 1.9 --  PHOS -- -- --   Liver Function Tests:  Lab 05/15/12 0215  AST 17  ALT 10    ALKPHOS 85  BILITOT 1.0  PROT 7.1  ALBUMIN 3.9   No results found for this basename: LIPASE:5,AMYLASE:5 in the last 168 hours No results found for this basename: AMMONIA:5 in the last 168 hours CBC:  Lab 05/16/12 0449 05/15/12 0215  WBC 14.0* 15.4*  NEUTROABS -- 11.8*  HGB 14.6 15.6  HCT 43.1 44.8  MCV 97.5 95.3  PLT 117* 142*   Cardiac Enzymes:  Lab 05/15/12 1434 05/15/12 0920 05/15/12 0215  CKTOTAL -- 230 --  CKMB -- 3.6 --  CKMBINDEX -- -- --  TROPONINI <0.30 <0.30 <0.30   BNP (last 3 results) No results found for this basename: PROBNP:3 in the last 8760 hours CBG: No results found for this basename: GLUCAP:5 in the last 168 hours  Recent Results (from the past 240 hour(s))  MRSA PCR SCREENING     Status: Normal   Collection Time   05/15/12  6:41 AM      Component Value Range Status Comment   MRSA by PCR NEGATIVE  NEGATIVE Final      Studies: Ct Angio Chest Pe W/cm &/or Wo Cm  05/15/2012  *RADIOLOGY REPORT*  Clinical Data: New onset atrial fibrillation.  Back and shoulder pain.  Elevated D-dimer.  CT ANGIOGRAPHY CHEST  Technique:  Multidetector CT imaging of the chest using the standard protocol during bolus administration of intravenous contrast. Multiplanar reconstructed images including MIPs were obtained and reviewed to evaluate the vascular anatomy.  Contrast: OMNIPAQUE IOHEXOL 350 MG/ML SOLN  Comparison: Chest x-ray earlier today.  Findings: No filling defects in the pulmonary arteries to suggest pulmonary emboli. Heart is normal size. Aorta is normal caliber. Scattered small mediastinal lymph nodes, some which are partially calcified.  No hilar or axillary adenopathy.  Left base atelectasis.   No pleural effusions.  No acute bony abnormality.  IMPRESSION: No evidence of pulmonary embolus.  The left base atelectasis.   Original Report Authenticated By: Cyndie Chime, M.D.    Dg Chest Port 1 View  05/15/2012  *RADIOLOGY REPORT*  Clinical Data: Short of  breath.  Pain between shoulder blades.  PORTABLE CHEST - 1 VIEW  Comparison: None.  Findings: Patient is rotated to the right.  Cardiopericardial silhouette is within normal limits for projection. There is interstitial prominence diffusely which although nonspecific, can be associated with interstitial pulmonary edema and mild CHF. Atypical infection considered less likely. Monitoring leads are projected over the chest.  No focal consolidation.  Mild basilar atelectasis.  IMPRESSION: Interstitial prominence suggesting interstitial pulmonary edema and mild CHF.   Original Report Authenticated By: Andreas Newport, M.D.     Scheduled Meds:   . antiseptic oral rinse  15 mL Mouth Rinse BID  . apixaban  2.5 mg Oral BID  . influenza  inactive virus vaccine  0.5 mL Intramuscular Tomorrow-1000  . metoprolol tartrate  12.5 mg Oral TID  . pantoprazole  40 mg Oral Q1200  . sodium chloride  3 mL Intravenous Q12H   Continuous Infusions:   . sodium chloride 10 mL/hr at 05/15/12 5409    ________________________________________________________________________  Time spent: 30 min    [facility]  [facility] Pager [phone] If 8PM-8AM, please contact night-coverage at [email or web address], password [facility] 05/16/2012, 3:42 PM  LOS: 1 day

## 2012-05-16 NOTE — Progress Notes (Signed)
Patient ID: Justin Baker, male   DOB: 30-Jun-1922, 78 y.o.   MRN: [id number]    Subjective:  Denies SSCP, palpitations or Dyspnea Loose stool ? From salad Objective:  Filed Vitals:   05/15/12 2100 05/16/12 0129 05/16/12 0601 05/16/12 0800  BP: 95/49 108/54 98/55 95/47   Pulse:      Temp:  97.9 F (36.6 C) 97.5 F (36.4 C) 98.2 F (36.8 C)  TempSrc:  Oral Oral Oral  Resp:  16 18 18   Height:      Weight:      SpO2:  96% 93% 95%    Intake/Output from previous day:  Intake/Output Summary (Last 24 hours) at 05/16/12 0102 Last data filed at 05/16/12 0857  Gross per 24 hour  Intake    905 ml  Output      0 ml  Net    905 ml    Physical Exam: Affect appropriate Healthy:  appears stated age HEENT: normal Neck supple with no adenopathy JVP normal no bruits no thyromegaly Lungs clear with no wheezing and good diaphragmatic motion Heart:  S1/S2 no murmur, no rub, gallop or click PMI normal Abdomen: benighn, BS positve, no tenderness, no AAA no bruit.  No HSM or HJR Distal pulses intact with no bruits No edema Neuro non-focal Skin warm and dry No muscular weakness   Lab Results: Basic Metabolic Panel:  Basename 05/16/12 0449 05/15/12 0904 05/15/12 0215  NA 139 -- 138  K 4.2 -- 4.3  CL 106 -- 104  CO2 21 -- 21  GLUCOSE 107* -- 140*  BUN 28* -- 21  CREATININE 1.41* -- 1.20  CALCIUM 8.9 -- 9.1  MG -- 1.9 --  PHOS -- -- --   Liver Function Tests:  Basename 05/15/12 0215  AST 17  ALT 10  ALKPHOS 85  BILITOT 1.0  PROT 7.1  ALBUMIN 3.9   No results found for this basename: LIPASE:2,AMYLASE:2 in the last 72 hours CBC:  Basename 05/16/12 0449 05/15/12 0215  WBC 14.0* 15.4*  NEUTROABS -- 11.8*  HGB 14.6 15.6  HCT 43.1 44.8  MCV 97.5 95.3  PLT 117* 142*   Cardiac Enzymes:  Basename 05/15/12 1434 05/15/12 0920 05/15/12 0215  CKTOTAL -- 230 --  CKMB -- 3.6 --  CKMBINDEX -- -- --  TROPONINI <0.30 <0.30 <0.30   BNP: No components found with this  basename: POCBNP:3 D-Dimer:  Basename 05/15/12 0904  DDIMER 1.27*   Hemoglobin A1C:  Basename 05/15/12 0904  HGBA1C 5.6   Fasting Lipid Panel:  Basename 05/16/12 0449  CHOL 109  HDL 42  LDLCALC 54  TRIG 65  CHOLHDL 2.6  LDLDIRECT --   Thyroid Function Tests:  Basename 05/15/12 0904  TSH 0.811  T4TOTAL --  T3FREE --  THYROIDAB --   Anemia Panel: No results found for this basename: VITAMINB12,FOLATE,FERRITIN,TIBC,IRON,RETICCTPCT in the last 72 hours  Imaging: Ct Angio Chest Pe W/cm &/or Wo Cm  05/15/2012  *RADIOLOGY REPORT*  Clinical Data: New onset atrial fibrillation.  Back and shoulder pain.  Elevated D-dimer.  CT ANGIOGRAPHY CHEST  Technique:  Multidetector CT imaging of the chest using the standard protocol during bolus administration of intravenous contrast. Multiplanar reconstructed images including MIPs were obtained and reviewed to evaluate the vascular anatomy.  Contrast: OMNIPAQUE IOHEXOL 350 MG/ML SOLN  Comparison: Chest x-ray earlier today.  Findings: No filling defects in the pulmonary arteries to suggest pulmonary emboli. Heart is normal size. Aorta is normal caliber. Scattered small mediastinal lymph nodes, some  which are partially calcified.  No hilar or axillary adenopathy.  Left base atelectasis.   No pleural effusions.  No acute bony abnormality.  IMPRESSION: No evidence of pulmonary embolus.  The left base atelectasis.   Original Report Authenticated By: Cyndie Chime, M.D.    Dg Chest Port 1 View  05/15/2012  *RADIOLOGY REPORT*  Clinical Data: Short of breath.  Pain between shoulder blades.  PORTABLE CHEST - 1 VIEW  Comparison: None.  Findings: Patient is rotated to the right.  Cardiopericardial silhouette is within normal limits for projection. There is interstitial prominence diffusely which although nonspecific, can be associated with interstitial pulmonary edema and mild CHF. Atypical infection considered less likely. Monitoring leads are projected  over the chest.  No focal consolidation.  Mild basilar atelectasis.  IMPRESSION: Interstitial prominence suggesting interstitial pulmonary edema and mild CHF.   Original Report Authenticated By: Andreas Newport, M.D.     Cardiac Studies:  ECG:  9/23  afib rapid rate no ischemic changes   Telemetry: afib rates still in 110-125 range  Echo:  EF 50-55% only mild LAE  Medications:     . antiseptic oral rinse  15 mL Mouth Rinse BID  . apixaban  2.5 mg Oral BID  . influenza  inactive virus vaccine  0.5 mL Intramuscular Tomorrow-1000  . metoprolol tartrate  12.5 mg Oral TID  . pantoprazole  40 mg Oral Q1200  . sodium chloride  3 mL Intravenous Q12H  . DISCONTD: aspirin  325 mg Oral Daily  . DISCONTD: enoxaparin (LOVENOX) injection  40 mg Subcutaneous Daily  . DISCONTD: metoprolol tartrate  25 mg Oral Q8H  . DISCONTD: metoprolol tartrate  25 mg Oral TID       . sodium chloride 10 mL/hr at 05/15/12 0635  . DISCONTD: diltiazem (CARDIZEM) infusion      Assessment/Plan:  Afib:  BP running low Rate control limited by this.  Only mild LAE  Long discussion with patient about options.  He has A wife with dementia and wants to get home.  Favor TEE/DCC tomorrow and possible late D/C  He will think it over Tentatively schedule with DB Continue eliquis.   Chest Pain:  R/O no ischemic ECG changes.  Will need outpatient stress testing post [facility]  [name] 05/16/2012, 9:09 AM

## 2012-05-17 ENCOUNTER — Encounter (HOSPITAL_COMMUNITY): Payer: Self-pay | Admitting: Anesthesiology

## 2012-05-17 ENCOUNTER — Encounter (HOSPITAL_COMMUNITY): Payer: Self-pay | Admitting: *Deleted

## 2012-05-17 ENCOUNTER — Inpatient Hospital Stay (HOSPITAL_COMMUNITY): Payer: Medicare Other | Admitting: Anesthesiology

## 2012-05-17 ENCOUNTER — Encounter (HOSPITAL_COMMUNITY)
Admission: EM | Disposition: A | Discharge status: Home / Self Care | Payer: Self-pay | Source: Home / Self Care | Attending: Internal Medicine

## 2012-05-17 DIAGNOSIS — R071 Chest pain on breathing: Secondary | ICD-10-CM

## 2012-05-17 DIAGNOSIS — N179 Acute kidney failure, unspecified: Secondary | ICD-10-CM

## 2012-05-17 DIAGNOSIS — I4891 Unspecified atrial fibrillation: Secondary | ICD-10-CM

## 2012-05-17 HISTORY — PX: CARDIOVERSION: SHX1299

## 2012-05-17 HISTORY — PX: TEE WITHOUT CARDIOVERSION: SHX5443

## 2012-05-17 LAB — BASIC METABOLIC PANEL
BUN: 27 mg/dL — ABNORMAL HIGH (ref 6–23)
Chloride: 107 mEq/L (ref 96–112)
GFR calc non Af Amer: 45 mL/min — ABNORMAL LOW (ref >90)
Glucose, Bld: 100 mg/dL — ABNORMAL HIGH (ref 70–99)
Potassium: 3.9 mEq/L (ref 3.5–5.1)

## 2012-05-17 LAB — CBC
HCT: 41.5 % (ref 39.0–52.0)
Hemoglobin: 13.9 g/dL (ref 13.0–17.0)
MCH: 32.3 pg (ref 26.0–34.0)
MCHC: 33.5 g/dL (ref 30.0–36.0)

## 2012-05-17 SURGERY — ECHOCARDIOGRAM, TRANSESOPHAGEAL
Anesthesia: Moderate Sedation

## 2012-05-17 MED ORDER — MIDAZOLAM HCL 10 MG/2ML IJ SOLN
INTRAMUSCULAR | Status: DC | PRN
  Administered 2012-05-17: 1 mg via INTRAVENOUS
  Administered 2012-05-17: 2 mg via INTRAVENOUS

## 2012-05-17 MED ORDER — AMIODARONE HCL IN DEXTROSE 360-4.14 MG/200ML-% IV SOLN
1.0000 mg/min | INTRAVENOUS | Status: AC
  Administered 2012-05-17 (×2): 1 mg/min via INTRAVENOUS
  Filled 2012-05-17 (×2): qty 200

## 2012-05-17 MED ORDER — APIXABAN 2.5 MG PO TABS: 2.5000 mg | ORAL_TABLET | Freq: Two times a day (BID) | ORAL | Status: DC

## 2012-05-17 MED ORDER — LIDOCAINE VISCOUS 2 % MT SOLN
OROMUCOSAL | Status: AC
  Filled 2012-05-17: qty 15

## 2012-05-17 MED ORDER — SODIUM CHLORIDE 0.9 % IV SOLN
INTRAVENOUS | Status: DC
  Administered 2012-05-17 – 2012-05-18 (×2): via INTRAVENOUS

## 2012-05-17 MED ORDER — AMIODARONE HCL IN DEXTROSE 360-4.14 MG/200ML-% IV SOLN
0.5000 mg/min | INTRAVENOUS | Status: DC
  Administered 2012-05-18: 0.5 mg/min via INTRAVENOUS
  Filled 2012-05-17 (×3): qty 200

## 2012-05-17 MED ORDER — METOPROLOL TARTRATE 12.5 MG HALF TABLET: ORAL_TABLET | ORAL | Status: DC

## 2012-05-17 MED ORDER — MIDAZOLAM HCL 5 MG/ML IJ SOLN
INTRAMUSCULAR | Status: AC
  Filled 2012-05-17: qty 1

## 2012-05-17 MED ORDER — FENTANYL CITRATE 0.05 MG/ML IJ SOLN
INTRAMUSCULAR | Status: AC
  Filled 2012-05-17: qty 2

## 2012-05-17 MED ORDER — LIDOCAINE VISCOUS 2 % MT SOLN
OROMUCOSAL | Status: DC | PRN
  Administered 2012-05-17: 10 mL via OROMUCOSAL

## 2012-05-17 MED ORDER — PROPOFOL 10 MG/ML IV BOLUS
INTRAVENOUS | Status: DC | PRN
  Administered 2012-05-17: 40 mg via INTRAVENOUS

## 2012-05-17 MED ORDER — SODIUM CHLORIDE 0.9 % IV SOLN
INTRAVENOUS | Status: DC | PRN
  Administered 2012-05-17: 14:00:00 via INTRAVENOUS

## 2012-05-17 MED ORDER — AMIODARONE LOAD VIA INFUSION
150.0000 mg | Freq: Once | INTRAVENOUS | Status: AC
  Administered 2012-05-17: 150 mg via INTRAVENOUS
  Filled 2012-05-17: qty 83.34

## 2012-05-17 MED ORDER — FENTANYL CITRATE 0.05 MG/ML IJ SOLN
INTRAMUSCULAR | Status: DC | PRN
  Administered 2012-05-17: 25 ug via INTRAVENOUS

## 2012-05-17 NOTE — Progress Notes (Signed)
[facility] Progress Note [place] TEAM 1 - Stepdown/ICU TEAM   Maurico Perrell GEX:[id number] DOB: 10-05-1921 DOA: 05/15/2012 PCP: No primary provider on file.  Brief narrative: 78 y.o. male with history of PVD, self diagnosed COPD who has not seen an M.D. for years. Presented to the ED with bilateral shoulder pain, upper mid back pain and palpitations. He initially presented to the [facility] and was transferred to [facility] step down unit. He was in his usual state of health until he woke up with pain in both shoulders and upper mid back. He denied any history of direct trauma or lifting anything unusual. Pain was made worse by deep inspiration and was reproducible with chest wall movement. He denied anterior chest pain and complained of difficulty taking a deep breath secondary to pain. He denied cough, fever or chills. The pain improved spontaneously. He went to bed but was awakened at approximately with worsening similar pains but at that time was associated with palpitations and some lightheadedness and unsteadiness. His son drove him to med [employer] where he was found to be in Atrial fibrillation with RVR. Chest x-ray and troponin times one were negative. He was started on Cardizem drip with good effect and was transferred to [facility].  Assessment/Plan:  Atrial fibrillation with rapid ventricular response  Was started on beta blockers to assist with rate control but titration has been limited due to issues with hypotension. He was started on Eliquis for anticoagulation this admission. Ischemic workup consisting of stress test deferred to outpatient setting by cardiology. Patient underwent cardioversion on 05/17/2012, but this was unsuccessful.  As a result amio has been initiated by Cardiolgy.  Back pain and Shoulder pain, bilateral  Felt to be anginal equivalent due to atrial fibrillation with RVR and has now resolved.   Leukocytosis  No source of  infection found and likely related to stress demargination. Currently has resolved   Thrombocytopenia  presented with a platelet count of 142,000. Platelet count on 05/17/2012 was 122,000 and no signs of active bleeding.  Followup CBC in AM.  Acute renal failure  Likely secondary to low perfusion from initial tachycardia and atrial fibrillation and associated hypotension. IV fluid was not administered due to concerns of possible risk for developing pulmonary edema. Baseline creatinine 1.2, and current trend is one of improvement.  Code Status: Full Disposition Plan: not yet ready for d/c as pt failed [facility]  Consultants: [facility]  Procedures: 05/17/2012 - unsuccessful TEE [facility]  Antibiotics: none  DVT prophylaxis: Fully anticoagulated  HPI/Subjective: The pt is doing quite well today.  He denies cp, sob, f/c abdom pain, or a current sensation of palpitations.   Objective: Blood pressure 119/86, pulse 91, temperature 98.8 F (37.1 C), temperature source Oral, resp. rate 14, height 5\' 10"  (1.778 m), weight 92.6 kg (204 lb 2.3 oz), SpO2 97.00%.  Intake/Output Summary (Last 24 hours) at 05/17/12 1748 Last data filed at 05/17/12 1700  Gross per 24 hour  Intake   1090 ml  Output    550 ml  Net    540 ml     Exam: General: No acute respiratory distress Lungs: Clear to auscultation bilaterally without wheezes or crackles Cardiovascular: irreg irreg w/o gallup or rub  Abdomen: Nontender, nondistended, soft, bowel sounds positive, no rebound, no ascites, no appreciable mass Extremities: No significant cyanosis, clubbing, or edema bilateral lower extremities  Data Reviewed: Basic Metabolic Panel:  Lab 05/17/12 0102 05/16/12 0449 05/15/12 7253  05/15/12 0215  NA 139 139 -- 138  K 3.9 4.2 -- 4.3  CL 107 106 -- 104  CO2 21 21 -- 21  GLUCOSE 100* 107* -- 140*  BUN 27* 28* -- 21  CREATININE 1.35 1.41* -- 1.20  CALCIUM 8.9 8.9 -- 9.1  MG -- -- 1.9 --  PHOS -- -- -- --    Liver Function Tests:  Lab 05/15/12 0215  AST 17  ALT 10  ALKPHOS 85  BILITOT 1.0  PROT 7.1  ALBUMIN 3.9   CBC:  Lab 05/17/12 0438 05/16/12 0449 05/15/12 0215  WBC 9.9 14.0* 15.4*  NEUTROABS -- -- 11.8*  HGB 13.9 14.6 15.6  HCT 41.5 43.1 44.8  MCV 96.5 97.5 95.3  PLT 122* 117* 142*   Cardiac Enzymes:  Lab 05/15/12 1434 05/15/12 0920 05/15/12 0215  CKTOTAL -- 230 --  CKMB -- 3.6 --  CKMBINDEX -- -- --  TROPONINI <0.30 <0.30 <0.30   Recent Results (from the past 240 hour(s))  MRSA PCR SCREENING     Status: Normal   Collection Time   05/15/12  6:41 AM      Component Value Range Status Comment   MRSA by PCR NEGATIVE  NEGATIVE Final      Studies:  Recent x-ray studies have been reviewed in detail by the Attending Physician  Scheduled Meds:  Reviewed in detail by the Attending Physician   Lonia Blood, MD [facility] Office  [phone] Pager [phone]  On-Call/Text Page:      [email or web address]      password [email or web address]  If 7PM-7AM, please contact night-coverage [email or web address] Password [email or web address] 05/17/2012, 5:48 PM   LOS: 2 days

## 2012-05-17 NOTE — Progress Notes (Signed)
To endoscopy by wheelchair, stable. 

## 2012-05-17 NOTE — Progress Notes (Signed)
Back from endoscopy dept. Awake and alert. 

## 2012-05-17 NOTE — Transfer of Care (Signed)
Immediate Anesthesia Transfer of Care Note  Patient: Justin Baker  Procedure(s) Performed: Procedure(s) (LRB) with comments: TRANSESOPHAGEAL ECHOCARDIOGRAM (TEE) ([name]) CARDIOVERSION ([name])  Patient Location: Endoscopy Unit  Anesthesia Type: General  Level of Consciousness: awake, alert  and oriented  Airway & Oxygen Therapy: Patient Spontanous Breathing and Patient connected to nasal cannula oxygen  Post-op Assessment: Report given to PACU RN and Post -op Vital signs reviewed and stable  Post vital signs: Reviewed and stable  Complications: No apparent anesthesia complications

## 2012-05-17 NOTE — Discharge Summary (Signed)
DISCHARGE SUMMARY  Justin Baker  MR#: [id number]  DOB:02-01-1922  Date of Admission: 05/15/2012 Date of Discharge: 05/18/2012  Attending Physician:Jeffrey Silvestre Gunner, MD  Patient's PCP:No primary provider on file.  Consults:Treatment Team:  [name], MD  Pertinent discharge information: Status post cardioversion on 05/17/2012. Cardiology recommended outpatient stress test after discharge. Started on beta blockers this admission  Discharge Diagnoses: Active Problems:  Atrial fibrillation with rapid ventricular response status post cardioversion 05/17/2012  Back pain- resolved  Shoulder pain, bilateral-resolved  Leukocytosis-resolved  Thrombocytopenia-stable  ARF (acute renal failure)-improved baseline creatinine 1.2   Radiology: Ct Angio Chest Pe W/cm &/or Wo Cm  05/15/2012  *RADIOLOGY REPORT*  Clinical Data: New onset atrial fibrillation.  Back and shoulder pain.  Elevated D-dimer.  CT ANGIOGRAPHY CHEST  Technique:  Multidetector CT imaging of the chest using the standard protocol during bolus administration of intravenous contrast. Multiplanar reconstructed images including MIPs were obtained and reviewed to evaluate the vascular anatomy.  Contrast: OMNIPAQUE IOHEXOL 350 MG/ML SOLN  Comparison: Chest x-ray earlier today.  Findings: No filling defects in the pulmonary arteries to suggest pulmonary emboli. Heart is normal size. Aorta is normal caliber. Scattered small mediastinal lymph nodes, some which are partially calcified.  No hilar or axillary adenopathy.  Left base atelectasis.   No pleural effusions.  No acute bony abnormality.  IMPRESSION: No evidence of pulmonary embolus.  The left base atelectasis.   Original Report Authenticated By: Cyndie Chime, M.D.    Dg Chest Port 1 View  05/15/2012  *RADIOLOGY REPORT*  Clinical Data: Short of breath.  Pain between shoulder blades.  PORTABLE CHEST - 1 VIEW  Comparison: None.  Findings: Patient is rotated to the right.   Cardiopericardial silhouette is within normal limits for projection. There is interstitial prominence diffusely which although nonspecific, can be associated with interstitial pulmonary edema and mild CHF. Atypical infection considered less likely. Monitoring leads are projected over the chest.  No focal consolidation.  Mild basilar atelectasis.  IMPRESSION: Interstitial prominence suggesting interstitial pulmonary edema and mild CHF.   Original Report Authenticated By: Andreas Newport, M.D.     Laboratory: Results for orders placed during the hospital encounter of 05/15/12 (from the past 48 hour(s))  BASIC METABOLIC PANEL     Status: Abnormal   Collection Time   05/17/12  4:38 AM      Component Value Range Comment   Sodium 139  135 - 145 mEq/L    Potassium 3.9  3.5 - 5.1 mEq/L    Chloride 107  96 - 112 mEq/L    CO2 21  19 - 32 mEq/L    Glucose, Bld 100 (*) 70 - 99 mg/dL    BUN 27 (*) 6 - 23 mg/dL    Creatinine, Ser 4.09  0.50 - 1.35 mg/dL    Calcium 8.9  8.4 - 81.1 mg/dL    GFR calc non Af Amer 45 (*) >90 mL/min    GFR calc Af Amer 52 (*) >90 mL/min   CBC     Status: Abnormal   Collection Time   05/17/12  4:38 AM      Component Value Range Comment   WBC 9.9  4.0 - 10.5 K/uL    RBC 4.30  4.22 - 5.81 MIL/uL    Hemoglobin 13.9  13.0 - 17.0 g/dL    HCT 91.4  78.2 - 95.6 %    MCV 96.5  78.0 - 100.0 fL    MCH 32.3  26.0 -  34.0 pg    MCHC 33.5  30.0 - 36.0 g/dL    RDW 16.1  09.6 - 04.5 %    Platelets 122 (*) 150 - 400 K/uL   BASIC METABOLIC PANEL     Status: Abnormal   Collection Time   05/18/12  4:45 AM      Component Value Range Comment   Sodium 141  135 - 145 mEq/L    Potassium 4.0  3.5 - 5.1 mEq/L    Chloride 109  96 - 112 mEq/L    CO2 20  19 - 32 mEq/L    Glucose, Bld 101 (*) 70 - 99 mg/dL    BUN 24 (*) 6 - 23 mg/dL    Creatinine, Ser 4.09  0.50 - 1.35 mg/dL    Calcium 8.7  8.4 - 81.1 mg/dL    GFR calc non Af Amer 46 (*) >90 mL/min    GFR calc Af Amer 54 (*) >90 mL/min         Medication List     As of 05/17/2012  1:50 PM    STOP taking these medications         aspirin 325 MG tablet      TAKE these medications         apixaban 2.5 MG Tabs tablet   Commonly known as: ELIQUIS   Take 1 tablet (2.5 mg total) by mouth 2 (two) times daily.      GLUCOSAMINE CHONDROITIN PLUS PO   Take 30 mLs by mouth daily.      metoprolol tartrate 12.5 mg Tabs   Commonly known as: LOPRESSOR   12.5 mg or 1 tablet twice daily            Amiodarone 200 mg tablets         Commonly known as: Pacerone         Take two 200 mg tablets twice daily for 14 days then decrease to one 200 mg tablet         twice daily     History of present illness: 78 y.o. male with history of PVD, self diagnosed COPD who is in great health and has not seen an M.D. for years. Presented to the ED with bilateral shoulder pain, upper mid back pain and palpitations. He initially presented to the [facility] and was transferred to [facility] step down unit. He was in his usual state of health until 2 nights ago when he woke up with pain in both shoulders and upper mid back. He denied any history of direct trauma or lifting anything unusual. Pain was made worse by deep inspiration and was reproducible with chest wall movement. He denied anterior chest pain and complains of difficulty taking a deep breath secondary to pain. He denied cough, fever or chills. The pain improved spontaneously but had it mildly yesterday. He went to bed but was awakened at approximately 2 AM with worsening similar pains but at that time wasassociated with palpitations and some lightheadedness and unsteadiness. His son drove him to med [employer] where he was found to be in Atrial fibrillation with RVR. Chest x-ray and troponin times one were negative. He was started on Cardizem drip with good effect and was transferred to [facility]. Patient currently denies palpitations but still complains of about  pains. He denies any long distance travel. He remembers one episode of palpitations approximately 15 years ago which lasted for a day and then resolved  spontaneously.   Hospital Course: Active Problems:  Atrial fibrillation with rapid ventricular response  Back pain  Shoulder pain, bilateral  Leukocytosis  Thrombocytopenia  ARF (acute renal failure)    Atrial fibrillation with rapid ventricular response Was started on beta blockers to assist with rate control but titration has been limited due to issues with hypotension. Patient underwent cardioversion on 05/17/2012 but this was unsuccessful so the patient has been started on amiodarone. He will take oral amiodarone 400 mg twice a day for 14 days then decrease to 200 mg twice daily thereafter. Because of hypotension issues he was only able to tolerate low dose Lopressor at 12.5 mg by mouth twice a day. He was also started on Eliquis for anticoagulation this admission. Ischemic workup consisting of stress test deferred to outpatient setting by cardiology.   Back pain and Shoulder pain, bilateral Felt to be anginal equivalent due to atrial fibrillation with RVR and has now resolved.  Leukocytosis No source of infection found and likely related to stress demargination. Currently has resolved  Thrombocytopenia presented with a platelet count of 142,000. Platelet count on 05/17/2012 was 122,000 and no signs of active bleeding. Recommend followup CBC and platelet count after discharge especially and patient receiving anticoagulation  Acute renal failure Likely secondary to low perfusion from initial tachycardia and atrial fibrillation and associated hypotension. IV fluid was not administered due to concerns of possible risk for developing pulmonary edema. Baseline creatinine 1.2 but on date of discharge creatinine down to 1.35 although BUN still elevated around 20.  Day of Discharge BP 125/67  Pulse 81  Temp 97.5 F (36.4 C) (Oral)  Resp  16  Ht 5\' 10"  (1.778 m)  Wt 92.6 kg (204 lb 2.3 oz)  BMI 29.29 kg/m2  SpO2 98%  Physical Exam: General: Alert, walking in room, no distress  Cardiovascular: atrial fibrillation with ventricular rates over 100 but less than 120, no peripheral edema, no rubs murmurs gallops or thrills  Respiratory: clear to auscultation without wheeze or crackles, on room air saturations 96% Abdomen: soft, Non tender, non distended, bowel sounds present Ext: symmetrical without edema cyanosis or clubbing  Follow-up: Dr. Eden Emms with cardiology in 3 week. At this time in addition to following appear atrophic fibrillation scheduling for outpatient stress test will likely be completed.  Disposition:  Discharge to home  Junious Silk, ANP pager [phone]   I have examined the patient, reviewed the chart and modified the above d/c summary which I agree with.   Calvert Cantor, MD [phone]

## 2012-05-17 NOTE — Progress Notes (Signed)
Patient ID: Justin Baker, male   DOB: 12-06-1921, 78 y.o.   MRN: [id number]    Subjective:  Denies SSCP, palpitations or Dyspnea Wants to go home after procedure  Objective:  Filed Vitals:   05/16/12 1942 05/17/12 0056 05/17/12 0058 05/17/12 0334  BP: 110/56 99/56  98/41  Pulse:      Temp: 99.1 F (37.3 C)  98.5 F (36.9 C) 99 F (37.2 C)  TempSrc:   Oral Oral  Resp: 18 18  18   Height:      Weight:      SpO2: 94% 94%  94%    Intake/Output from previous day:  Intake/Output Summary (Last 24 hours) at 05/17/12 0756 Last data filed at 05/17/12 0700  Gross per 24 hour  Intake    410 ml  Output    550 ml  Net   -140 ml    Physical Exam: Affect appropriate Healthy:  appears stated age HEENT: normal Neck supple with no adenopathy JVP normal no bruits no thyromegaly Lungs clear with no wheezing and good diaphragmatic motion Heart:  S1/S2 no murmur, no rub, gallop or click PMI normal Abdomen: benighn, BS positve, no tenderness, no AAA no bruit.  No HSM or HJR Distal pulses intact with no bruits No edema Neuro non-focal Skin warm and dry No muscular weakness   Lab Results: Basic Metabolic Panel:  Basename 05/17/12 0438 05/16/12 0449 05/15/12 0904  NA 139 139 --  K 3.9 4.2 --  CL 107 106 --  CO2 21 21 --  GLUCOSE 100* 107* --  BUN 27* 28* --  CREATININE 1.35 1.41* --  CALCIUM 8.9 8.9 --  MG -- -- 1.9  PHOS -- -- --   Liver Function Tests:  Basename 05/15/12 0215  AST 17  ALT 10  ALKPHOS 85  BILITOT 1.0  PROT 7.1  ALBUMIN 3.9   No results found for this basename: LIPASE:2,AMYLASE:2 in the last 72 hours CBC:  Basename 05/17/12 0438 05/16/12 0449 05/15/12 0215  WBC 9.9 14.0* --  NEUTROABS -- -- 11.8*  HGB 13.9 14.6 --  HCT 41.5 43.1 --  MCV 96.5 97.5 --  PLT 122* 117* --   Cardiac Enzymes:  Basename 05/15/12 1434 05/15/12 0920 05/15/12 0215  CKTOTAL -- 230 --  CKMB -- 3.6 --  CKMBINDEX -- -- --  TROPONINI <0.30 <0.30 <0.30   BNP: No  components found with this basename: POCBNP:3 D-Dimer:  Basename 05/15/12 0904  DDIMER 1.27*   Hemoglobin A1C:  Basename 05/15/12 0904  HGBA1C 5.6   Fasting Lipid Panel:  Basename 05/16/12 0449  CHOL 109  HDL 42  LDLCALC 54  TRIG 65  CHOLHDL 2.6  LDLDIRECT --   Thyroid Function Tests:  Basename 05/15/12 0904  TSH 0.811  T4TOTAL --  T3FREE --  THYROIDAB --   Anemia Panel: No results found for this basename: VITAMINB12,FOLATE,FERRITIN,TIBC,IRON,RETICCTPCT in the last 72 hours  Imaging: Ct Angio Chest Pe W/cm &/or Wo Cm  05/15/2012  *RADIOLOGY REPORT*  Clinical Data: New onset atrial fibrillation.  Back and shoulder pain.  Elevated D-dimer.  CT ANGIOGRAPHY CHEST  Technique:  Multidetector CT imaging of the chest using the standard protocol during bolus administration of intravenous contrast. Multiplanar reconstructed images including MIPs were obtained and reviewed to evaluate the vascular anatomy.  Contrast: OMNIPAQUE IOHEXOL 350 MG/ML SOLN  Comparison: Chest x-ray earlier today.  Findings: No filling defects in the pulmonary arteries to suggest pulmonary emboli. Heart is normal size. Aorta is normal  caliber. Scattered small mediastinal lymph nodes, some which are partially calcified.  No hilar or axillary adenopathy.  Left base atelectasis.   No pleural effusions.  No acute bony abnormality.  IMPRESSION: No evidence of pulmonary embolus.  The left base atelectasis.   Original Report Authenticated By: Cyndie Chime, M.D.     Cardiac Studies:  ECG:  9/23  afib rapid rate no ischemic changes   Telemetry: afib rates still in 110-125 range 05/17/2012   Echo:  EF 50-55% only mild LAE  Medications:      . antiseptic oral rinse  15 mL Mouth Rinse BID  . apixaban  2.5 mg Oral BID  . influenza  inactive virus vaccine  0.5 mL Intramuscular Tomorrow-1000  . metoprolol tartrate  12.5 mg Oral TID  . pantoprazole  40 mg Oral Q1200  . sodium chloride  3 mL Intravenous Q12H          . sodium chloride 20 mL/hr at 05/17/12 0000  . sodium chloride      Assessment/Plan:  Afib:  BP running low Rate control limited by this.  Only mild LAE  Long discussion with patient about options.  He has A wife with dementia and wants to get home.  Favor TEE/DCC at 1:30 with DB  and possible late D/C Continue Eliquis  Chest Pain:  R/O no ischemic ECG changes.  Will need outpatient stress testing post DCC  BP:  Will hydrate this am since EF ok and NPO  [name] 05/17/2012, 7:56 AM

## 2012-05-17 NOTE — CV Procedure (Signed)
    TRANSESOPHAGEAL ECHOCARDIOGRAM GUIDED DIRECT CURRENT CARDIOVERSION  NAME:  Justin Baker   MRN: [id number] DOB:  07/15/1922   ADMIT DATE: 05/15/2012  INDICATIONS: Symptomatic AF   PROCEDURE:   Informed consent was obtained prior to the procedure. The risks, benefits and alternatives for the procedure were discussed and the patient comprehended these risks.  Risks include, but are not limited to, cough, sore throat, vomiting, nausea, somnolence, esophageal and stomach trauma or perforation, bleeding, low blood pressure, aspiration, pneumonia, infection, trauma to the teeth and death.    After a procedural time-out, the patient was given 3 mg versed and 25 mcg fentanyl for moderate sedation.  The oropharynx was anesthetized 10cc tpical 1% viscous lidocaine.  The transesophageal probe was inserted in the esophagus and stomach without difficulty and multiple views were obtained.    FINDINGS:  LEFT VENTRICLE: EF = 55%. No regional wall motion abnormalities.  RIGHT VENTRICLE: Normal  LEFT ATRIUM: Moderately to severely dilated  LEFT ATRIAL APPENDAGE: No clot  RIGHT ATRIUM: Moderately dilated  AORTIC VALVE:  [place].   No AS/AI  MITRAL VALVE:    Normal. Mild MR  TRICUSPID VALVE: Normal Trivial TR  PULMONIC VALVE: Not well seen  INTERATRIAL SEPTUM: No obvious PFO/ASD  PERICARDIUM: No effusion  DESCENDING AORTA: Mild to moderate plaque   CARDIOVERSION:     Indications:  Atrial Fibrillation  Procedure Details:  Once the TEE was complete, the patient had the defibrillator pads placed in the anterior and posterior position. The patient then underwent further sedation by the anesthesia service for cardioversion. Once an appropriate level of sedation was achieved, the patient received a single biphasic, synchronized 200J shock. He remained in AF.  No apparent complications.  COMPLICATIONS:    There were no immediate complications.   CONCLUSION:   1.  Unsuccessful TEE  guided cardioversion.  2.    Will start amiodarone as BP tolerates.  D/W Dr. Eden Emms.   Truman Hayward 2:32 PM

## 2012-05-17 NOTE — Preoperative (Signed)
Beta Blockers   Reason not to administer Beta Blockers:Not Applicable 

## 2012-05-17 NOTE — Progress Notes (Signed)
Echocardiogram Echocardiogram Transesophageal has been performed.  Justin Baker 05/17/2012, 2:21 PM

## 2012-05-17 NOTE — Anesthesia Postprocedure Evaluation (Signed)
  Anesthesia Post-op Note  Patient: Justin Baker  Procedure(s) Performed: Procedure(s) (LRB) with comments: TRANSESOPHAGEAL ECHOCARDIOGRAM (TEE) ([name]) CARDIOVERSION ([name])  Patient Location: Endoscopy Unit  Anesthesia Type: General  Level of Consciousness: awake, alert , oriented and patient cooperative  Airway and Oxygen Therapy: Patient Spontanous Breathing and Patient connected to nasal cannula oxygen  Post-op Pain: none  Post-op Assessment: Post-op Vital signs reviewed, Patient's Cardiovascular Status Stable, Respiratory Function Stable, Patent Airway, No signs of Nausea or vomiting, Adequate PO intake, Pain level controlled, No headache and No backache  Post-op Vital Signs: Reviewed and stable  Complications: No apparent anesthesia complications

## 2012-05-17 NOTE — Progress Notes (Signed)
Anesthesia induction at 1427.  Cardioversion 200 joules at 1428.  Unsuccessful.

## 2012-05-17 NOTE — Anesthesia Preprocedure Evaluation (Signed)
Anesthesia Evaluation  Patient identified by MRN, date of birth, ID band Patient awake    Reviewed: Allergy & Precautions, H&P , NPO status , Patient's Chart, lab work & pertinent test results  History of Anesthesia Complications Negative for: history of anesthetic complications  Airway Mallampati: II TM Distance: >3 FB Neck ROM: Full    Dental No notable dental hx. (+) Teeth Intact and Dental Advisory Given   Pulmonary neg COPDformer smoker,  breath sounds clear to auscultation  Pulmonary exam normal       Cardiovascular + dysrhythmias (EF 55%) Atrial Fibrillation Rhythm:Irregular Rate:Tachycardia     Neuro/Psych negative neurological ROS     GI/Hepatic Neg liver ROS, GERD-  Controlled,  Endo/Other  negative endocrine ROS  Renal/GU Renal InsufficiencyRenal disease     Musculoskeletal   Abdominal (+) + obese,   Peds  Hematology   Anesthesia Other Findings   Reproductive/Obstetrics                           Anesthesia Physical Anesthesia Plan  ASA: III  Anesthesia Plan: General   Post-op Pain Management:    Induction: Intravenous  Airway Management Planned: Mask  Additional Equipment:   Intra-op Plan:   Post-operative Plan:   Informed Consent: I have reviewed the patients History and Physical, chart, labs and discussed the procedure including the risks, benefits and alternatives for the proposed anesthesia with the patient or authorized representative who has indicated his/her understanding and acceptance.   Dental advisory given  Plan Discussed with: Surgeon and CRNA  Anesthesia Plan Comments:         Anesthesia Quick Evaluation

## 2012-05-17 NOTE — Progress Notes (Signed)
Echocardiogram Echocardiogram Transesophageal has been performed.  Justin Baker 05/17/2012, 5:19 PM

## 2012-05-18 ENCOUNTER — Encounter (HOSPITAL_COMMUNITY): Payer: Self-pay | Admitting: Internal Medicine

## 2012-05-18 LAB — BASIC METABOLIC PANEL
BUN: 24 mg/dL — ABNORMAL HIGH (ref 6–23)
Chloride: 109 mEq/L (ref 96–112)
Glucose, Bld: 101 mg/dL — ABNORMAL HIGH (ref 70–99)
Potassium: 4 mEq/L (ref 3.5–5.1)

## 2012-05-18 MED ORDER — AMIODARONE HCL 200 MG PO TABS
400.0000 mg | ORAL_TABLET | Freq: Two times a day (BID) | ORAL | Status: DC
  Administered 2012-05-18: 400 mg via ORAL
  Filled 2012-05-18 (×2): qty 2

## 2012-05-18 MED ORDER — AMIODARONE HCL 200 MG PO TABS: ORAL_TABLET | ORAL | Status: DC

## 2012-05-18 MED ORDER — AMIODARONE HCL 400 MG PO TABS: ORAL_TABLET | ORAL | Status: DC

## 2012-05-18 NOTE — Progress Notes (Signed)
Discharged home accompanied by wife and son, prescription and discharge instructions given to pt. Belongings with family. Pt is stable.

## 2012-05-18 NOTE — Progress Notes (Signed)
Patient ID: Justin Baker, male   DOB: August 22, 1922, 78 y.o.   MRN: [id number]    Subjective:  Denies SSCP, palpitations or Dyspnea   Objective:  Filed Vitals:   05/17/12 2000 05/17/12 2325 05/18/12 0348 05/18/12 0800  BP: 111/65 114/69 108/51 126/83  Pulse: 80 81    Temp: 98 F (36.7 C) 98.2 F (36.8 C) 98 F (36.7 C) 98.3 F (36.8 C)  TempSrc: Oral Oral  Oral  Resp: 16 15 16 17   Height:      Weight:      SpO2: 95% 95% 96% 98%    Intake/Output from previous day:  Intake/Output Summary (Last 24 hours) at 05/18/12 0902 Last data filed at 05/18/12 0800  Gross per 24 hour  Intake 2785.41 ml  Output   1025 ml  Net 1760.41 ml    Physical Exam: Affect appropriate Healthy:  appears stated age HEENT: normal Neck supple with no adenopathy JVP normal no bruits no thyromegaly Lungs clear with no wheezing and good diaphragmatic motion Heart:  S1/S2 no murmur, no rub, gallop or click PMI normal Abdomen: benighn, BS positve, no tenderness, no AAA no bruit.  No HSM or HJR Distal pulses intact with no bruits No edema Neuro non-focal Skin warm and dry No muscular weakness   Lab Results: Basic Metabolic Panel:  Basename 05/18/12 0445 05/17/12 0438 05/15/12 0904  NA 141 139 --  K 4.0 3.9 --  CL 109 107 --  CO2 20 21 --  GLUCOSE 101* 100* --  BUN 24* 27* --  CREATININE 1.32 1.35 --  CALCIUM 8.7 8.9 --  MG -- -- 1.9  PHOS -- -- --   Liver Function Tests: No results found for this basename: AST:2,ALT:2,ALKPHOS:2,BILITOT:2,PROT:2,ALBUMIN:2 in the last 72 hours No results found for this basename: LIPASE:2,AMYLASE:2 in the last 72 hours CBC:  Basename 05/17/12 0438 05/16/12 0449  WBC 9.9 14.0*  NEUTROABS -- --  HGB 13.9 14.6  HCT 41.5 43.1  MCV 96.5 97.5  PLT 122* 117*   Cardiac Enzymes:  Basename 05/15/12 1434 05/15/12 0920  CKTOTAL -- 230  CKMB -- 3.6  CKMBINDEX -- --  TROPONINI <0.30 <0.30   BNP: No components found with this basename:  POCBNP:3 D-Dimer:  Basename 05/15/12 0904  DDIMER 1.27*   Hemoglobin A1C:  Basename 05/15/12 0904  HGBA1C 5.6   Fasting Lipid Panel:  Basename 05/16/12 0449  CHOL 109  HDL 42  LDLCALC 54  TRIG 65  CHOLHDL 2.6  LDLDIRECT --   Thyroid Function Tests:  Basename 05/15/12 0904  TSH 0.811  T4TOTAL --  T3FREE --  THYROIDAB --   Anemia Panel: No results found for this basename: VITAMINB12,FOLATE,FERRITIN,TIBC,IRON,RETICCTPCT in the last 72 hours  Imaging: No results found.  Cardiac Studies:  ECG:  9/23  afib rapid rate no ischemic changes   Telemetry: afib rates still in 110-125 range 05/18/2012   Echo:  EF 50-55% only mild LAE  Medications:      . amiodarone  150 mg Intravenous Once  . antiseptic oral rinse  15 mL Mouth Rinse BID  . apixaban  2.5 mg Oral BID  . metoprolol tartrate  12.5 mg Oral TID  . pantoprazole  40 mg Oral Q1200  . sodium chloride  3 mL Intravenous Q12H        . sodium chloride 20 mL/hr at 05/17/12 0000  . sodium chloride 100 mL/hr at 05/17/12 2245  . amiodarone (NEXTERONE PREMIX) 360 mg/200 mL dextrose 1 mg/min (05/17/12 2017)  Followed by  . amiodarone (NEXTERONE PREMIX) 360 mg/200 mL dextrose 0.5 mg/min (05/18/12 0433)  . DISCONTD: sodium chloride      Assessment/Plan:  Afib:  Failed DCC  D/C iv amiodarone Give PO 400 bid for two weeks then 200 bid. If he tolerated oral pills today D/C at 4:00 or 5:00  I will see in 3 weeks    Chest Pain:  R/O no ischemic ECG changes.  Will need outpatient stress testing post DCC  BP:  ONly low dose lopresser tolerated 12.5 bid  D/C with Eliquis for anticoagulation  [name] 05/18/2012, 9:02 AM

## 2012-05-18 NOTE — Progress Notes (Signed)
Ambulated along the hallway with assistance, limping on right leg- had right hip surgery before. No sob nor chestpain presented. Back to chair with call light within reach.

## 2012-05-25 ENCOUNTER — Telehealth: Payer: Self-pay | Admitting: Cardiology

## 2012-05-25 NOTE — Telephone Encounter (Signed)
Patient called and was complaining of some shortness of breath when lying down. He was concerned it was amiodarone which is a new drug for him since discharge. He denies any fever, chills, nausea, sputum production. He's had a cough for years.  Looking back through his records, he has good LV function with no history of heart failure. He has new onset atrial fib and failed cardioversion. He is being placed on amiodarone 400 mg twice a day to loaded and then going back to 200 twice a day.  He denies any edema or PND. He is not short of breath during the day.  After long discussion we decided to cut his amiodarone back to 200 mg twice a day. He will call back if he continues to have problems. I told him I thought it was unlikely this was amiodarone this early on. Is comfortable with this plan. Will forward to Dr.Nishan

## 2012-05-27 ENCOUNTER — Other Ambulatory Visit: Payer: Self-pay | Admitting: Cardiology

## 2012-05-27 MED ORDER — APIXABAN 2.5 MG PO TABS: 2.5000 mg | ORAL_TABLET | Freq: Two times a day (BID) | ORAL | Status: DC

## 2012-05-31 ENCOUNTER — Telehealth: Payer: Self-pay | Admitting: Cardiovascular Disease

## 2012-05-31 NOTE — Telephone Encounter (Signed)
New Problem:    Patient called in still having the respiratory issues form when he called on 05/25/12.  See 05/25/12 phone note.  Please call back.

## 2012-05-31 NOTE — Telephone Encounter (Signed)
Continue amiodarone 200 bid

## 2012-05-31 NOTE — Telephone Encounter (Signed)
Telephone Encounter     Patient called and was complaining of some shortness of breath when lying down. He was concerned it was amiodarone which is a new drug for him since discharge. He denies any fever, chills, nausea, sputum production. He's had a cough for years.  Looking back through his records, he has good LV function with no history of heart failure. He has new onset atrial fib and failed cardioversion. He is being placed on amiodarone 400 mg twice a day to loaded and then going back to 200 twice a day.  He denies any edema or PND. He is not short of breath during the day.  After long discussion we decided to cut his amiodarone back to 200 mg twice a day. He will call back if he continues to have problems. I told him I thought it was unlikely this was amiodarone this early on. Is comfortable with this plan. Will forward to Dr.Nishan      SPOKE WITH PT'S  CAREGIVER   TODAY CONTINUES TO C/O SHORTNESS OF BREATH WHEN LYING DOWN ALSO  HAVING WEAKNESS , NOTED THIS ONCE STARTING AMIODARONE IS CURRENTLY TAKING 200 MG BID  NO CHANGE IN SYMPTOMS WITH DECREASE  IN MED WILL  FORWARD TO DR Eden Emms FOR REVIEW./CY

## 2012-06-02 NOTE — Telephone Encounter (Signed)
PT AWARE. PER PT BREATHING IS BETTER.  INSTRUCTED TO CONT TO MONITOR  BREATHING  TO CALL us BACK NEXT WEEK  WITH UPDATE .PT AGREES WITH PLAN./CY

## 2012-06-29 ENCOUNTER — Encounter: Payer: Self-pay | Admitting: *Deleted

## 2012-06-29 ENCOUNTER — Encounter: Payer: Self-pay | Admitting: Cardiovascular Disease

## 2012-07-03 ENCOUNTER — Ambulatory Visit (INDEPENDENT_AMBULATORY_CARE_PROVIDER_SITE_OTHER): Payer: Medicare Other | Admitting: Cardiovascular Disease

## 2012-07-03 ENCOUNTER — Encounter: Payer: Self-pay | Admitting: Cardiovascular Disease

## 2012-07-03 VITALS — BP 178/70 | HR 44 | Wt 203.0 lb

## 2012-07-03 DIAGNOSIS — R9439 Abnormal result of other cardiovascular function study: Secondary | ICD-10-CM

## 2012-07-03 DIAGNOSIS — I480 Paroxysmal atrial fibrillation: Secondary | ICD-10-CM

## 2012-07-03 DIAGNOSIS — R079 Chest pain, unspecified: Secondary | ICD-10-CM | POA: Insufficient documentation

## 2012-07-03 DIAGNOSIS — R931 Abnormal findings on diagnostic imaging of heart and coronary circulation: Secondary | ICD-10-CM | POA: Insufficient documentation

## 2012-07-03 DIAGNOSIS — I4891 Unspecified atrial fibrillation: Secondary | ICD-10-CM

## 2012-07-03 NOTE — Patient Instructions (Signed)
Your physician recommends that you schedule a follow-up appointment in:  3 weeks  WITH DR Uh [facility]  Your physician has recommended you make the following change in your medication: STOP AMIODARONE   Your physician has requested that you have a lexiscan myoview. For further information please visit [email or web address] Please follow instruction sheet, as given. DX CHEST PAIN

## 2012-07-03 NOTE — Assessment & Plan Note (Signed)
NO clinical CHF asses during myovue in NSR  Adjust meds accordingly

## 2012-07-03 NOTE — Assessment & Plan Note (Signed)
NSR stop amiodarone  Continue eliquis

## 2012-07-03 NOTE — Assessment & Plan Note (Signed)
Lexiscan myovue R/O in hospital post [facility] no ischemia

## 2012-07-03 NOTE — Progress Notes (Signed)
Patient ID: Justin Baker, male   DOB: 01-Mar-1922, 78 y.o.   MRN: [id number] 78 yo recently hospitalized for chest pain and PAF.  DCC on Eliquis.  Needs F/U outpatient myovue.  Bad hips and cannot walk on treadmill.  Since D/C some weakness but maintaining NSR.  Will stop amiodarone as he thinks this makes him feel poorly.  Mild diarhea.  TEE done in hospital to R/O clot showed EF 40-45%    Study Conclusions  - Left ventricle: Systolic function was mildly to moderately reduced. The estimated ejection fraction was in the range of 40% to 45%. - Aortic valve: No evidence of vegetation. - Mitral valve: No evidence of vegetation. Mild regurgitation. - Left atrium: The atrium was moderately to severely dilated. No evidence of thrombus in the atrial cavity or appendage. - Right ventricle: Systolic function was mildly reduced. - Right atrium: The atrium was moderately dilated. - Atrial septum: No defect or patent foramen ovale was identified.   ROS: Denies fever, malais, weight loss, blurry vision, decreased visual acuity, cough, sputum, SOB, hemoptysis, pleuritic pain, palpitaitons, heartburn, abdominal pain, melena, lower extremity edema, claudication, or rash.  All other systems reviewed and negative  General: Affect appropriate Healthy:  appears stated age HEENT: normal Neck supple with no adenopathy JVP normal no bruits no thyromegaly Lungs clear with no wheezing and good diaphragmatic motion Heart:  S1/S2 no murmur, no rub, gallop or click PMI normal Abdomen: benighn, BS positve, no tenderness, no AAA no bruit.  No HSM or HJR Distal pulses intact with no bruits No edema Neuro non-focal Skin warm and dry No muscular weakness   Current Outpatient Prescriptions  Medication Sig Dispense Refill  . amiodarone (PACERONE) 200 MG tablet 200 mg tablet twice daily      . apixaban (ELIQUIS) 2.5 MG TABS tablet Take 1 tablet (2.5 mg total) by mouth 2 (two) times daily.  60 tablet  6  .  Glucos-Chond-Sterol-Fish Oil (GLUCOSAMINE CHONDROITIN PLUS PO) Take 30 mLs by mouth daily.      . metoprolol tartrate (LOPRESSOR) 12.5 mg TABS 12.5 mg or 1 tablet twice daily  60 tablet  0  . [DISCONTINUED] amiodarone (PACERONE) 200 MG tablet Take two 200 mg tabs twice daily for 14 days then one 200 mg tablet twice daily thereafter  84 tablet  1    Allergies  Review of patient's allergies indicates no known allergies.  Electrocardiogram:  05/15/12  Afib rate 153  Assessment and Plan

## 2012-07-10 ENCOUNTER — Ambulatory Visit (HOSPITAL_COMMUNITY): Payer: Medicare Other | Attending: Cardiovascular Disease | Admitting: Radiology

## 2012-07-10 VITALS — BP 151/83 | Ht 70.0 in | Wt 202.0 lb

## 2012-07-10 DIAGNOSIS — R079 Chest pain, unspecified: Secondary | ICD-10-CM

## 2012-07-10 DIAGNOSIS — I4891 Unspecified atrial fibrillation: Secondary | ICD-10-CM

## 2012-07-10 DIAGNOSIS — I480 Paroxysmal atrial fibrillation: Secondary | ICD-10-CM

## 2012-07-10 MED ORDER — REGADENOSON 0.4 MG/5ML IV SOLN
0.4000 mg | Freq: Once | INTRAVENOUS | Status: AC
  Administered 2012-07-10: 0.4 mg via INTRAVENOUS

## 2012-07-10 MED ORDER — TECHNETIUM TC 99M SESTAMIBI GENERIC - CARDIOLITE
10.0000 | Freq: Once | INTRAVENOUS | Status: AC | PRN
  Administered 2012-07-10: 10 via INTRAVENOUS

## 2012-07-10 MED ORDER — TECHNETIUM TC 99M SESTAMIBI GENERIC - CARDIOLITE
30.0000 | Freq: Once | INTRAVENOUS | Status: AC | PRN
  Administered 2012-07-10: 30 via INTRAVENOUS

## 2012-07-10 NOTE — Progress Notes (Signed)
[facility] SITE 3 NUCLEAR MED [address] [id number] [place] [address] [phone]  Cardiology Nuclear Med Study  Justin Baker is a 78 y.o. male     MRN : [id number]     DOB: 04/18/1922  Procedure Date: 07/10/2012  Nuclear Med Background Indication for Stress Test:  Evaluation for Ischemia History: AFIB;9/13'Cardioversion;? COPD;05/17/12 ECHO EF:40-45% Cardiac Risk Factors: History of Smoking and PVD  Symptoms:  Chest Pain, DOE and Fatigue   Nuclear Pre-Procedure Caffeine/Decaff Intake:  None NPO After: 7:00pm   Lungs:  clear O2 Sat: 98% on room air. IV 0.9% NS with Angio Cath:  22g  IV Site: R Hand  IV Started by:  Cathlyn Parsons, RN  Chest Size (in):  46 Cup Size: n/a  Height: 5\' 10"  (1.778 m)  Weight:  202 lb (91.627 kg)  BMI:  Body mass index is 28.98 kg/(m^2). Tech Comments:  Lopressor taken at [employer] today    Nuclear Med Study 1 or 2 day study: 1 day  Stress Test Type:  Lexiscan  Reading MD: Willa Rough, MD  Order Authorizing Provider:  Burna Cash  Resting Radionuclide: Technetium 35m Sestamibi  Resting Radionuclide Dose: 11.0 mCi   Stress Radionuclide:  Technetium 89m Sestamibi  Stress Radionuclide Dose: 33.0 mCi           Stress Protocol Rest HR: 50 Stress HR: 65  Rest BP: 151/83 Stress BP: 152/83  Exercise Time (min): n/a METS: n/a   Predicted Max HR: 132 bpm % Max HR: 49.24 bpm Rate Pressure Product: 9880   Dose of Adenosine (mg):  n/a Dose of Lexiscan: 0.4 mg  Dose of Atropine (mg): n/a Dose of Dobutamine: n/a mcg/kg/min (at max HR)  Stress Test Technologist: Frederick Peers, EMT-P  Nuclear Technologist:  Domenic Polite, CNMT     Rest Procedure:  Myocardial perfusion imaging was performed at rest 45 minutes following the intravenous administration of Technetium 8m Sestamibi Rest ECG: SB 1AVB  Stress Procedure:  The patient received IV Lexiscan 0.4 mg over 15-seconds.  Technetium 74m Sestamibi injected at  30-seconds.  There were no significant changes, having headache with Lexiscan.  Quantitative spect images were obtained after a 45 minute delay. Stress ECG: No significant change from baseline ECG  QPS Raw Data Images:  Patient motion noted; appropriate software correction applied. Stress Images:  There are no significant abnormalities Rest Images:  There are no significant abnormalities Subtraction (SDS):  No evidence of ischemia. Transient Ischemic Dilatation (Normal <1.22):  0.97 Lung/Heart Ratio (Normal <0.45):  0.25  Quantitative Gated Spect Images QGS EDV:  97 ml QGS ESV:  34 ml  Impression Exercise Capacity:  Lexiscan with no exercise. BP Response:  Normal blood pressure response. Clinical Symptoms:  headache ECG Impression:  No significant ST segment change suggestive of ischemia. Comparison with Prior Nuclear Study: No images to compare  Overall Impression:  Normal stress nuclear study.  LV Ejection Fraction: 65%.  LV Wall Motion:  Normal Wall Motion.  Willa Rough, MD

## 2012-07-21 ENCOUNTER — Other Ambulatory Visit: Payer: Self-pay | Admitting: *Deleted

## 2012-07-24 ENCOUNTER — Encounter: Payer: Self-pay | Admitting: Cardiovascular Disease

## 2012-07-24 ENCOUNTER — Ambulatory Visit (INDEPENDENT_AMBULATORY_CARE_PROVIDER_SITE_OTHER): Payer: Medicare Other | Admitting: Cardiovascular Disease

## 2012-07-24 VITALS — BP 135/71 | HR 58 | Wt 204.0 lb

## 2012-07-24 DIAGNOSIS — I4891 Unspecified atrial fibrillation: Secondary | ICD-10-CM

## 2012-07-24 DIAGNOSIS — R079 Chest pain, unspecified: Secondary | ICD-10-CM

## 2012-07-24 DIAGNOSIS — R931 Abnormal findings on diagnostic imaging of heart and coronary circulation: Secondary | ICD-10-CM

## 2012-07-24 DIAGNOSIS — R9439 Abnormal result of other cardiovascular function study: Secondary | ICD-10-CM

## 2012-07-24 DIAGNOSIS — M25559 Pain in unspecified hip: Secondary | ICD-10-CM

## 2012-07-24 NOTE — Patient Instructions (Signed)
Your physician wants you to follow-up in:  6 MONTHS WITH DR NISHAN  You will receive a reminder letter in the mail two months in advance. If you don't receive a letter, please call our office to schedule the follow-up appointment. Your physician recommends that you continue on your current medications as directed. Please refer to the Current Medication list given to you today. 

## 2012-07-24 NOTE — Assessment & Plan Note (Signed)
Non recurrent Normal myovue follow

## 2012-07-24 NOTE — Assessment & Plan Note (Signed)
F/U Dr Thomasena Edis Clear to have surgery from cardiac perspective.  Would need telemetry and would like to follow him post op for PAF

## 2012-07-24 NOTE — Assessment & Plan Note (Signed)
Maint NSR continue beta blocker stop Eliquis

## 2012-07-24 NOTE — Progress Notes (Signed)
Patient ID: Justin Baker, male   DOB: 24-Dec-1921, 78 y.o.   MRN: [id number] 78 yo recently hospitalized for chest pain and PAF. DCC on Eliquis. Needs F/U outpatient myovue. Bad hips and cannot walk on treadmill. Since D/C some weakness but maintaining NSR. Will stop amiodarone as he thinks this makes him feel poorly. Mild diarhea. TEE done in hospital to R/O clot showed EF 40-45%  Study Conclusions  - Left ventricle: Systolic function was mildly to moderately reduced. The estimated ejection fraction was in the range of 40% to 45%. - Aortic valve: No evidence of vegetation. - Mitral valve: No evidence of vegetation. Mild regurgitation. - Left atrium: The atrium was moderately to severely dilated. No evidence of thrombus in the atrial cavity or appendage. - Right ventricle: Systolic function was mildly reduced. - Right atrium: The atrium was moderately dilated. - Atrial septum: No defect or patent foramen ovale was identified.  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  All other systems reviewed and negative  General: Affect appropriate Healthy:  appears stated age HEENT: normal Neck supple with no adenopathy JVP normal no bruits no thyromegaly Lungs clear with no wheezing and good diaphragmatic motion Heart:  S1/S2 no murmur, no rub, gallop or click PMI normal Abdomen: benighn, BS positve, no tenderness, no AAA no bruit.  No HSM or HJR Distal pulses intact with no bruits No edema Neuro non-focal Skin warm and dry No muscular weakness   Current Outpatient Prescriptions  Medication Sig Dispense Refill  .  apixaban (ELIQUIS) 2.5 MG TABS tablet Take 1 tablet (2.5 mg total) by mouth 2 (two) times daily.  60 tablet  6  . Glucos-Chond-Sterol-Fish Oil (GLUCOSAMINE CHONDROITIN PLUS PO) Take 30 mLs by mouth daily.      . metoprolol tartrate (LOPRESSOR) 12.5 mg TABS 12.5 mg or 1 tablet twice daily  60 tablet  0    Allergies  Review of patient's allergies indicates no known allergies.  Electrocardiogram:  Assessment and Plan

## 2012-07-24 NOTE — Assessment & Plan Note (Signed)
No clinical CHF and normal EF on myovue

## 2012-09-20 ENCOUNTER — Telehealth: Payer: Self-pay | Admitting: Cardiovascular Disease

## 2012-09-20 ENCOUNTER — Other Ambulatory Visit: Payer: Self-pay | Admitting: *Deleted

## 2012-09-20 DIAGNOSIS — I4891 Unspecified atrial fibrillation: Secondary | ICD-10-CM

## 2012-09-20 MED ORDER — METOPROLOL TARTRATE 25 MG PO TABS: 12.5000 mg | ORAL_TABLET | Freq: Two times a day (BID) | ORAL | Status: DC

## 2012-09-20 NOTE — Telephone Encounter (Signed)
New Problem:    Patient called in wanted to know if Dr. Eden Emms was going to take him off of his metoprolol tartrate (LOPRESSOR) 12.5 mg TABS.  If he was not he needs a refill of this medication sent to the pharmacy listed on his profile.  Please call back.

## 2013-01-22 ENCOUNTER — Ambulatory Visit: Payer: Medicare Other | Admitting: Cardiovascular Disease

## 2013-01-23 ENCOUNTER — Encounter: Payer: Self-pay | Admitting: Cardiovascular Disease

## 2013-01-23 ENCOUNTER — Ambulatory Visit (INDEPENDENT_AMBULATORY_CARE_PROVIDER_SITE_OTHER): Payer: Medicare Other | Admitting: Cardiovascular Disease

## 2013-01-23 VITALS — BP 142/82 | HR 60 | Ht 71.0 in | Wt 205.0 lb

## 2013-01-23 DIAGNOSIS — R079 Chest pain, unspecified: Secondary | ICD-10-CM

## 2013-01-23 DIAGNOSIS — M25559 Pain in unspecified hip: Secondary | ICD-10-CM

## 2013-01-23 DIAGNOSIS — I4891 Unspecified atrial fibrillation: Secondary | ICD-10-CM

## 2013-01-23 DIAGNOSIS — M25552 Pain in left hip: Secondary | ICD-10-CM

## 2013-01-23 NOTE — Assessment & Plan Note (Signed)
Gave him the names of surgeions who perfomr anterior approach.

## 2013-01-23 NOTE — Assessment & Plan Note (Signed)
Maint NSR off Eliquis  STable

## 2013-01-23 NOTE — Progress Notes (Signed)
Patient ID: Justin Baker, male   DOB: [birth year], 79 y.o.   MRN: [id number] 79 yo  hospitalized for chest pain and PAF 2013 . DCC on Eliquis. Bad hips and cannot walk on treadmill. Since D/C some weakness but maintaining NSR. Amiodarone stopped   TEE done in hospital to R/O clot showed EF 40-45%  Study Conclusions  - Left ventricle: Systolic function was mildly to moderately reduced. The estimated ejection fraction was in the range of 40% to 45%. - Aortic valve: No evidence of vegetation. - Mitral valve: No evidence of vegetation. Mild regurgitation. - Left atrium: The atrium was moderately to severely dilated. No evidence of thrombus in the atrial cavity or appendage. - Right ventricle: Systolic function was mildly reduced. - Right atrium: The atrium was moderately dilated. - Atrial septum: No defect or patent foramen ovale was identified.  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  All other systems reviewed and negative  General: Affect appropriate Healthy:  appears stated age HEENT: normal Neck supple with no adenopathy JVP normal no bruits no thyromegaly Lungs clear with no wheezing and good diaphragmatic motion Heart:  S1/S2 no murmur, no rub, gallop or click PMI normal Abdomen: benighn, BS positve, no tenderness, no AAA no bruit.  No HSM or HJR Distal pulses intact with no bruits No edema Neuro non-focal Skin warm and dry No muscular weakness   Current Outpatient Prescriptions  Medication Sig Dispense Refill  . apixaban (ELIQUIS) 2.5 MG TABS tablet Take 1 tablet (2.5 mg total) by mouth 2 (two)  times daily.  60 tablet  6  . Glucos-Chond-Sterol-Fish Oil (GLUCOSAMINE CHONDROITIN PLUS PO) Take 30 mLs by mouth daily.      . metoprolol tartrate (LOPRESSOR) 25 MG tablet Take 0.5 tablets (12.5 mg total) by mouth 2 (two) times daily.  30 tablet  6   No current facility-administered medications for this visit.    Allergies  Review of patient's allergies indicates no known allergies.  Electrocardiogram:  9/25  Afib rate 152 otherwise normal  Assessment and Plan

## 2013-01-23 NOTE — Assessment & Plan Note (Signed)
Resolved Normal myovue 2013  Stable

## 2013-01-23 NOTE — Patient Instructions (Signed)
DR Durene Romans  AND DR [facility]  PHONE NUMBER [phone] ADDRESS  [address] [place] ,[place]  [address]  DR Delynn Flavin  PHONE NUMBER [phone] [address] [place] ,[place] [address] Your physician wants you to follow-up in:   6 MONTHS WITH DR  Haywood Filler will receive a reminder letter in the mail two months in advance. If you don't receive a letter, please call our office to schedule the follow-up appointment. Your physician recommends that you continue on your current medications as directed. Please refer to the Current Medication list given to you today.

## 2013-04-27 ENCOUNTER — Other Ambulatory Visit: Payer: Self-pay | Admitting: *Deleted

## 2013-04-27 DIAGNOSIS — I4891 Unspecified atrial fibrillation: Secondary | ICD-10-CM

## 2013-04-27 MED ORDER — METOPROLOL TARTRATE 25 MG PO TABS: 12.5000 mg | ORAL_TABLET | Freq: Two times a day (BID) | ORAL | Status: DC

## 2013-07-02 ENCOUNTER — Ambulatory Visit (INDEPENDENT_AMBULATORY_CARE_PROVIDER_SITE_OTHER): Payer: Medicare Other | Admitting: Cardiovascular Disease

## 2013-07-02 ENCOUNTER — Encounter: Payer: Self-pay | Admitting: Cardiovascular Disease

## 2013-07-02 VITALS — BP 130/80 | HR 72 | Ht 70.0 in | Wt 207.8 lb

## 2013-07-02 DIAGNOSIS — M25559 Pain in unspecified hip: Secondary | ICD-10-CM

## 2013-07-02 DIAGNOSIS — I4891 Unspecified atrial fibrillation: Secondary | ICD-10-CM

## 2013-07-02 DIAGNOSIS — M25552 Pain in left hip: Secondary | ICD-10-CM

## 2013-07-02 NOTE — Patient Instructions (Signed)
Your physician wants you to follow-up in: YEAR WITH DR NISHAN  You will receive a reminder letter in the mail two months in advance. If you don't receive a letter, please call our office to schedule the follow-up appointment.  Your physician recommends that you continue on your current medications as directed. Please refer to the Current Medication list given to you today. 

## 2013-07-02 NOTE — Assessment & Plan Note (Signed)
Clear to have hip surgery when time comes

## 2013-07-02 NOTE — Assessment & Plan Note (Signed)
Maint NSR continue beta blocker and ASA

## 2013-07-02 NOTE — Progress Notes (Signed)
Patient ID: Justin Baker, male   DOB: 01-20-1922, 79 y.o.   MRN: [id number] 79 yo hospitalized for chest pain and PAF 2013 . DCC on Eliquis. Bad hips and cannot walk on treadmill. Since D/C some weakness but maintaining NSR. Amiodarone stopped  TEE done in hospital to R/O clot showed EF 40-45%  Study Conclusions  - Left ventricle: Systolic function was mildly to moderately reduced. The estimated ejection fraction was in the range of 40% to 45%. - Aortic valve: No evidence of vegetation. - Mitral valve: No evidence of vegetation. Mild regurgitation. - Left atrium: The atrium was moderately to severely dilated. No evidence of thrombus in the atrial cavity or appendage. - Right ventricle: Systolic function was mildly reduced. - Right atrium: The atrium was moderately dilated. - Atrial septum: No defect or patent foramen ovale was identified.  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  All other systems reviewed and negative  General: Affect appropriate Healthy:  appears stated age HEENT: normal Neck supple with no adenopathy JVP normal no bruits no thyromegaly Lungs clear with no wheezing and good diaphragmatic motion Heart:  S1/S2 SEM murmur, no rub, gallop or click PMI normal Abdomen: benighn, BS positve, no tenderness, no AAA no bruit.  No HSM or HJR Distal pulses intact with no bruits No edema Neuro non-focal Skin warm and dry No muscular weakness   Current Outpatient Prescriptions  Medication Sig Dispense Refill  .  Glucos-Chond-Sterol-Fish Oil (GLUCOSAMINE CHONDROITIN PLUS PO) Take 30 mLs by mouth daily.      . metoprolol tartrate (LOPRESSOR) 25 MG tablet Take 0.5 tablets (12.5 mg total) by mouth 2 (two) times daily.  30 tablet  5   No current facility-administered medications for this visit.    Allergies  Review of patient's allergies indicates no known allergies.  Electrocardiogram:  05/17/12 afib rate 153 nonspecific ST changes   Assessment and Plan

## 2013-09-29 ENCOUNTER — Other Ambulatory Visit: Payer: Self-pay | Admitting: Cardiovascular Disease

## 2014-05-03 ENCOUNTER — Other Ambulatory Visit: Payer: Self-pay | Admitting: Cardiovascular Disease

## 2014-07-01 ENCOUNTER — Emergency Department (HOSPITAL_BASED_OUTPATIENT_CLINIC_OR_DEPARTMENT_OTHER): Payer: Medicare Other

## 2014-07-01 ENCOUNTER — Emergency Department (HOSPITAL_BASED_OUTPATIENT_CLINIC_OR_DEPARTMENT_OTHER)
Admission: EM | Admit: 2014-07-01 | Discharge: 2014-07-01 | Disposition: A | Discharge status: Home / Self Care | Payer: Medicare Other | Attending: Emergency Medicine | Admitting: Emergency Medicine

## 2014-07-01 ENCOUNTER — Encounter (HOSPITAL_BASED_OUTPATIENT_CLINIC_OR_DEPARTMENT_OTHER): Payer: Self-pay | Admitting: *Deleted

## 2014-07-01 DIAGNOSIS — J209 Acute bronchitis, unspecified: Secondary | ICD-10-CM

## 2014-07-01 DIAGNOSIS — Z96643 Presence of artificial hip joint, bilateral: Secondary | ICD-10-CM | POA: Insufficient documentation

## 2014-07-01 DIAGNOSIS — Z87448 Personal history of other diseases of urinary system: Secondary | ICD-10-CM | POA: Insufficient documentation

## 2014-07-01 DIAGNOSIS — R42 Dizziness and giddiness: Secondary | ICD-10-CM | POA: Diagnosis not present

## 2014-07-01 DIAGNOSIS — R05 Cough: Secondary | ICD-10-CM | POA: Diagnosis present

## 2014-07-01 DIAGNOSIS — Z87891 Personal history of nicotine dependence: Secondary | ICD-10-CM | POA: Insufficient documentation

## 2014-07-01 DIAGNOSIS — I4891 Unspecified atrial fibrillation: Secondary | ICD-10-CM | POA: Insufficient documentation

## 2014-07-01 DIAGNOSIS — Z7901 Long term (current) use of anticoagulants: Secondary | ICD-10-CM | POA: Diagnosis not present

## 2014-07-01 DIAGNOSIS — Z79899 Other long term (current) drug therapy: Secondary | ICD-10-CM | POA: Diagnosis not present

## 2014-07-01 DIAGNOSIS — Z862 Personal history of diseases of the blood and blood-forming organs and certain disorders involving the immune mechanism: Secondary | ICD-10-CM | POA: Diagnosis not present

## 2014-07-01 DIAGNOSIS — J440 Chronic obstructive pulmonary disease with acute lower respiratory infection: Secondary | ICD-10-CM | POA: Diagnosis not present

## 2014-07-01 DIAGNOSIS — R059 Cough, unspecified: Secondary | ICD-10-CM

## 2014-07-01 DIAGNOSIS — M1990 Unspecified osteoarthritis, unspecified site: Secondary | ICD-10-CM | POA: Insufficient documentation

## 2014-07-01 LAB — BASIC METABOLIC PANEL
ANION GAP: 16 — AB (ref 5–15)
BUN: 22 mg/dL (ref 6–23)
CALCIUM: 9.2 mg/dL (ref 8.4–10.5)
CO2: 24 mEq/L (ref 19–32)
Chloride: 104 mEq/L (ref 96–112)
Creatinine, Ser: 1.5 mg/dL — ABNORMAL HIGH (ref 0.50–1.35)
GFR, EST AFRICAN AMERICAN: 45 mL/min — AB (ref >90)
GFR, EST NON AFRICAN AMERICAN: 39 mL/min — AB (ref >90)
Glucose, Bld: 119 mg/dL — ABNORMAL HIGH (ref 70–99)
POTASSIUM: 3.6 meq/L — AB (ref 3.7–5.3)
SODIUM: 144 meq/L (ref 137–147)

## 2014-07-01 LAB — CBC
HCT: 44.8 % (ref 39.0–52.0)
Hemoglobin: 15.2 g/dL (ref 13.0–17.0)
MCH: 32.8 pg (ref 26.0–34.0)
MCHC: 33.9 g/dL (ref 30.0–36.0)
MCV: 96.6 fL (ref 78.0–100.0)
Platelets: 214 K/uL (ref 150–400)
RBC: 4.64 MIL/uL (ref 4.22–5.81)
RDW: 12.6 % (ref 11.5–15.5)
WBC: 13.4 K/uL — ABNORMAL HIGH (ref 4.0–10.5)

## 2014-07-01 LAB — PRO B NATRIURETIC PEPTIDE: Pro B Natriuretic peptide (BNP): 1098 pg/mL — ABNORMAL HIGH (ref 0–450)

## 2014-07-01 MED ORDER — AEROCHAMBER PLUS W/MASK MISC
1.0000 | Freq: Once | Status: AC
  Administered 2014-07-01: 1
  Filled 2014-07-01: qty 1

## 2014-07-01 MED ORDER — POTASSIUM CHLORIDE CRYS ER 20 MEQ PO TBCR
40.0000 meq | EXTENDED_RELEASE_TABLET | Freq: Once | ORAL | Status: AC
  Administered 2014-07-01: 40 meq via ORAL
  Filled 2014-07-01: qty 2

## 2014-07-01 MED ORDER — METOPROLOL TARTRATE 1 MG/ML IV SOLN
INTRAVENOUS | Status: AC
Start: 2014-07-01 — End: 2014-07-01
  Administered 2014-07-01: 5 mg
  Filled 2014-07-01: qty 5

## 2014-07-01 MED ORDER — METOPROLOL TARTRATE 12.5 MG HALF TABLET
12.5000 mg | ORAL_TABLET | Freq: Once | ORAL | Status: AC
  Administered 2014-07-01: 12.5 mg via ORAL
  Filled 2014-07-01: qty 1

## 2014-07-01 MED ORDER — METOPROLOL TARTRATE 50 MG PO TABS
ORAL_TABLET | ORAL | Status: AC
  Filled 2014-07-01: qty 1

## 2014-07-01 MED ORDER — ALBUTEROL SULFATE HFA 108 (90 BASE) MCG/ACT IN AERS
2.0000 | INHALATION_SPRAY | RESPIRATORY_TRACT | Status: DC | PRN
  Administered 2014-07-01: 2 via RESPIRATORY_TRACT
  Filled 2014-07-01: qty 6.7

## 2014-07-01 NOTE — ED Notes (Signed)
MD at bedside. 

## 2014-07-01 NOTE — ED Notes (Signed)
Resp. Crystal in with pt. To instruct on how to use inhaler with areo chamber.

## 2014-07-01 NOTE — ED Notes (Signed)
Cough for almost 2 weeks since having a flu shot. States his wife has the same.

## 2014-07-01 NOTE — ED Notes (Addendum)
Pt sts he is not lightheaded any more.  Sts "I'm feeling better".  Pt sts he is ready to get dressed and go home.

## 2014-07-01 NOTE — ED Provider Notes (Signed)
CSN: [id number]     Arrival date & time 07/01/14  1151 History   First MD Initiated Contact with Patient 07/01/14 1232     Chief Complaint  Patient presents with  . Cough     (Consider location/radiation/quality/duration/timing/severity/associated sxs/prior Treatment) HPI Complains of nonproductive cough for the past 2 weeks. Denies shortness of breath denies chest pain no other associated symptomsexcept for mild lightheadedness this morning. Lightheadedness is worse with standing. Nothing makes symptoms better or worse.no fever.patient reports he did not take his metoprolol this morning. Past Medical History  Diagnosis Date  . COPD (chronic obstructive pulmonary disease)     self diagnosed  . Osteoarthritis     s/p right total hip  . Atrial fibrillation     anticoagulated with Eliquis   . Back pain   . Shoulder pain, bilateral   . Leukocytosis   . Thrombocytopenia   . ARF (acute renal failure)    Past Surgical History  Procedure Laterality Date  . Total hip arthroplasty      right  . Cataract extraction w/ intraocular lens implant      right  . Tee without cardioversion  05/17/2012    Procedure: TRANSESOPHAGEAL ECHOCARDIOGRAM (TEE);  Surgeon: Dolores Pattyaniel R Bensimhon, MD;  [facility];  Service: Cardiovascular;  Laterality: [name];  . Cardioversion  05/17/2012    Procedure: CARDIOVERSION;  Surgeon: Dolores Pattyaniel R Bensimhon, MD;  [facility];  Service: Cardiovascular;  Laterality: [name];  note: Patient reports she was taken off blood thinners Family History  Problem Relation Age of Onset  . Other      No known heart disease   History  Substance Use Topics  . Smoking status: Former Games developermoker  . Smokeless tobacco: Former NeurosurgeonUser    Quit date: 08/23/1976  . Alcohol Use: No    Review of Systems  Constitutional: Negative.   HENT: Negative.   Respiratory: Positive for cough.   Cardiovascular: Negative.   Gastrointestinal: Negative.   Musculoskeletal: Negative.   Skin:  Negative.   Neurological: Positive for light-headedness.  Psychiatric/Behavioral: Negative.   All other systems reviewed and are negative.     Allergies  Review of patient's allergies indicates no known allergies.  Home Medications   Prior to Admission medications   Medication Sig Start Date End Date Taking? Authorizing Provider  Glucos-Chond-Sterol-Fish Oil (GLUCOSAMINE CHONDROITIN PLUS PO) Take 30 mLs by mouth daily.    Historical Provider, MD  metoprolol tartrate (LOPRESSOR) 25 MG tablet Take 0.5 tablets (12.5 mg total) by mouth 2 (two) times daily. 04/27/13   Wendall StadePeter C Nishan, MD  metoprolol tartrate (LOPRESSOR) 25 MG tablet TAKE 1/2 TABLET TWICE PER DAY 09/29/13   Wendall StadePeter C Nishan, MD  metoprolol tartrate (LOPRESSOR) 25 MG tablet TAKE 1/2 TABLET TWICE PER DAY 05/03/14   Wendall StadePeter C Nishan, MD   BP 115/80 mmHg  Pulse 132  Temp(Src) 98 F (36.7 C) (Oral)  Resp 20  Ht 5\' 10"  (1.778 m)  Wt 207 lb (93.895 kg)  BMI 29.70 kg/m2  SpO2 95% Physical Exam  Constitutional: He appears well-developed and well-nourished. No distress.  Speaks in paragraphs. No distress  HENT:  Head: Normocephalic and atraumatic.  Eyes: Conjunctivae are normal. Pupils are equal, round, and reactive to light.  Neck: Neck supple. No tracheal deviation present. No thyromegaly present.  Cardiovascular:  No murmur heard. Tachycardic irregularly irregular  Pulmonary/Chest: Effort normal and breath sounds normal.  Abdominal: Soft. Bowel sounds are normal. He exhibits no distension. There is no tenderness.  Musculoskeletal: Normal range of motion. He exhibits no edema or tenderness.  Neurological: He is alert. Coordination normal.  Skin: Skin is warm and dry. No rash noted.  Psychiatric: He has a normal mood and affect.  Nursing note and vitals reviewed.   ED Course  Procedures (including critical care time) Labs Review Labs Reviewed - No data to display  Imaging Review No results found.   EKG  Interpretation   Date/Time:  Monday July 01 2014 12:16:35 EST Ventricular Rate:  148 PR Interval:    QRS Duration: 72 QT Interval:  260 QTC Calculation: 408 R Axis:   -25 Text Interpretation:  Atrial fibrillation with rapid ventricular response  with premature ventricular or aberrantly conducted complexes Nonspecific  ST abnormality Abnormal ECG SINCE LAST TRACING HEART RATE HAS INCREASED  Confirmed by Ethelda ChickJACUBOWITZ  MD, Nailah Luepke [phone]([id number]) on 07/01/2014 12:35:40 PM     EKG is identical to 05/15/2012 Results for orders placed or performed during the hospital encounter of 07/01/14  Basic metabolic panel  Result Value Ref Range   Sodium 144 137 - 147 mEq/L   Potassium 3.6 (L) 3.7 - 5.3 mEq/L   Chloride 104 96 - 112 mEq/L   CO2 24 19 - 32 mEq/L   Glucose, Bld 119 (H) 70 - 99 mg/dL   BUN 22 6 - 23 mg/dL   Creatinine, Ser 4.691.50 (H) 0.50 - 1.35 mg/dL   Calcium 9.2 8.4 - 62.910.5 mg/dL   GFR calc non Af Amer 39 (L) >90 mL/min   GFR calc Af Amer 45 (L) >90 mL/min   Anion gap 16 (H) 5 - 15  CBC  Result Value Ref Range   WBC 13.4 (H) 4.0 - 10.5 K/uL   RBC 4.64 4.22 - 5.81 MIL/uL   Hemoglobin 15.2 13.0 - 17.0 g/dL   HCT 52.844.8 41.339.0 - 24.452.0 %   MCV 96.6 78.0 - 100.0 fL   MCH 32.8 26.0 - 34.0 pg   MCHC 33.9 30.0 - 36.0 g/dL   RDW 01.012.6 27.211.5 - 53.615.5 %   Platelets 214 150 - 400 K/uL  Pro b natriuretic peptide (BNP)  Result Value Ref Range   Pro B Natriuretic peptide (BNP) 1098.0 (H) 0 - 450 pg/mL   Dg Chest Port 1 View  07/01/2014   CLINICAL DATA:  Two week history of cough and congestion  EXAM: PORTABLE CHEST - 1 VIEW  COMPARISON:  Chest radiograph and chest CT May 15, 2012  FINDINGS: There is no edema or consolidation. The heart size and pulmonary vascularity are within normal limits. No adenopathy. There is degenerative change in each shoulder.  IMPRESSION: No edema or consolidation.   Electronically Signed   By: Bretta BangWilliam  Woodruff M.D.   On: 07/01/2014 13:53    4:50 PM patient is  asymptomatic he feels improved after treatment with intravenous Lopressor and oral metoprolol. He is now in normal sinus rhythm at 75 bpm. MDM  Patient likely an atrial fib with rapid ventricular rate secondary to his not taking his scheduled medicine this morning. Plan albuterol HFA with spacer to go to use 2 puffs every 4 hours when necessary cough or shortness of breath #1Acute bronchitis #2atrial fibrillation with rapid ventricular response #3hypokalemia #4 renal insufficiency Final diagnoses:  None        Doug SouSam Derrion Tritz, MD 07/01/14 1703

## 2014-07-01 NOTE — Discharge Instructions (Signed)
Acute Bronchitis Use your inhaler 2 puffs every 4 hours as needed for cough or shortness of breath. Use Robitussin DM as needed for cough. See your physician if not improved in a week. Return if your condition worsens for any reason. Bronchitis is when the airways that extend from the windpipe into the lungs get red, puffy, and painful (inflamed). Bronchitis often causes thick spit (mucus) to develop. This leads to a cough. A cough is the most common symptom of bronchitis. In acute bronchitis, the condition usually begins suddenly and goes away over time (usually in 2 weeks). Smoking, allergies, and asthma can make bronchitis worse. Repeated episodes of bronchitis may cause more lung problems. HOME CARE  Rest.  Drink enough fluids to keep your pee (urine) clear or pale yellow (unless you need to limit fluids as told by your doctor).  Only take over-the-counter or prescription medicines as told by your doctor.  Avoid smoking and secondhand smoke. These can make bronchitis worse. If you are a smoker, think about using nicotine gum or skin patches. Quitting smoking will help your lungs heal faster.  Reduce the chance of getting bronchitis again by:  Washing your hands often.  Avoiding people with cold symptoms.  Trying not to touch your hands to your mouth, nose, or eyes.  Follow up with your doctor as told. GET HELP IF: Your symptoms do not improve after 1 week of treatment. Symptoms include:  Cough.  Fever.  Coughing up thick spit.  Body aches.  Chest congestion.  Chills.  Shortness of breath.  Sore throat. GET HELP RIGHT AWAY IF:   You have an increased fever.  You have chills.  You have severe shortness of breath.  You have bloody thick spit (sputum).  You throw up (vomit) often.  You lose too much body fluid (dehydration).  You have a severe headache.  You faint. MAKE SURE YOU:   Understand these instructions.  Will watch your condition.  Will get  help right away if you are not doing well or get worse. Document Released: 01/26/2008 Document Revised: 04/11/2013 Document Reviewed: 01/30/2013 [facility] Patient Information 2015 [place] [employer] [place]. This information is not intended to replace advice given to you by your health care provider. Make sure you discuss any questions you have with your health care provider.

## 2014-07-09 ENCOUNTER — Ambulatory Visit (INDEPENDENT_AMBULATORY_CARE_PROVIDER_SITE_OTHER): Payer: Medicare Other | Admitting: Cardiovascular Disease

## 2014-07-09 ENCOUNTER — Encounter: Payer: Self-pay | Admitting: Cardiovascular Disease

## 2014-07-09 VITALS — BP 140/90 | HR 59 | Ht 70.0 in | Wt 201.0 lb

## 2014-07-09 DIAGNOSIS — I4891 Unspecified atrial fibrillation: Secondary | ICD-10-CM

## 2014-07-09 DIAGNOSIS — B9789 Other viral agents as the cause of diseases classified elsewhere: Secondary | ICD-10-CM

## 2014-07-09 DIAGNOSIS — B349 Viral infection, unspecified: Secondary | ICD-10-CM

## 2014-07-09 DIAGNOSIS — J988 Other specified respiratory disorders: Secondary | ICD-10-CM

## 2014-07-09 DIAGNOSIS — R931 Abnormal findings on diagnostic imaging of heart and coronary circulation: Secondary | ICD-10-CM

## 2014-07-09 HISTORY — DX: Other viral agents as the cause of diseases classified elsewhere: B97.89

## 2014-07-09 NOTE — Patient Instructions (Signed)
Your physician wants you to follow-up in:  6 MONTHS WITH DR NISHAN  You will receive a reminder letter in the mail two months in advance. If you don't receive a letter, please call our office to schedule the follow-up appointment. Your physician recommends that you continue on your current medications as directed. Please refer to the Current Medication list given to you today. 

## 2014-07-09 NOTE — Progress Notes (Signed)
Patient ID: Justin Baker, male   DOB: 07-29-1922, 80 y.o.   MRN: [id number] 80 yo hospitalized for chest pain and PAF 2013 . DCC on Eliquis. Bad hips and cannot walk on treadmill. Since D/C some weakness but maintaining NSR. Amiodarone stopped  TEE done in hospital to R/O clot showed EF 40-45%  Study Conclusions  - Left ventricle: Systolic function was mildly to moderately reduced. The estimated ejection fraction was in the range of 40% to 45%. - Aortic valve: No evidence of vegetation. - Mitral valve: No evidence of vegetation. Mild regurgitation. - Left atrium: The atrium was moderately to severely dilated. No evidence of thrombus in the atrial cavity or appendage. - Right ventricle: Systolic function was mildly reduced. - Right atrium: The atrium was moderately dilated. - Atrial septum: No defect or patent foramen ovale was identified.  Myovue 07/11/12 normal EF 65% Maint NSR and will stop Eliquis at this point  Had right hip replaced 20 years ago [facility] although may have PAF Still has not decided if he is having enough pain in left hip to warrant surgery   Seen in ER 11/9  For bronchitis and had rapid afib  He was unaware of it Had URI after flu shot and was anxious because his wife was Being admitted with pneumonia    ROS: Denies fever, malais, weight loss, blurry vision, decreased visual acuity, cough, sputum, SOB, hemoptysis, pleuritic pain, palpitaitons, heartburn, abdominal pain, melena, lower extremity edema, claudication, or rash.  All other systems reviewed and negative  General: Affect appropriate Healthy:  appears stated age HEENT: normal Neck supple with no adenopathy JVP normal no bruits no thyromegaly Lungs clear with no wheezing and good diaphragmatic motion Heart:  S1/S2 no murmur, no rub, gallop or click PMI normal Abdomen: benighn, BS positve, no tenderness, no AAA no bruit.  No HSM or HJR Distal  pulses intact with no bruits No edema Neuro non-focal Skin warm and dry No muscular weakness   Current Outpatient Prescriptions  Medication Sig Dispense Refill  . Glucos-Chond-Sterol-Fish Oil (GLUCOSAMINE CHONDROITIN PLUS PO) Take 30 mLs by mouth daily.    . metoprolol tartrate (LOPRESSOR) 25 MG tablet Take 0.5 tablets (12.5 mg total) by mouth 2 (two) times daily. 30 tablet 5  . metoprolol tartrate (LOPRESSOR) 25 MG tablet TAKE 1/2 TABLET TWICE PER DAY 30 tablet 5  . metoprolol tartrate (LOPRESSOR) 25 MG tablet TAKE 1/2 TABLET TWICE PER DAY 30 tablet 1   No current facility-administered medications for this visit.    Allergies  Review of patient's allergies indicates no known allergies.  Electrocardiogram: AFIB RATE 148  Nonspecific ST changes  07/02/14  Today SR rate 59  LVH   Assessment and Plan

## 2014-07-09 NOTE — Assessment & Plan Note (Signed)
No clinical CHF  EF 40-45%  Continue current medications no indication for cath given age and lack of chest pain

## 2014-07-09 NOTE — Assessment & Plan Note (Signed)
Recent ER visit  CXR no pneumonia improved  No fever f/u primary

## 2014-07-09 NOTE — Assessment & Plan Note (Signed)
Continue ASA and beta blocker given age  Encouraged him not to forget to take his beta blocker

## 2014-08-01 IMAGING — CT CT ANGIO CHEST
2 of 6 series · 19 of 46 positions shown · IV contrast (APPLIED)
Comparison: Chest x-ray earlier today.

CLINICAL DATA: New onset atrial fibrillation.  Back and shoulder
pain.  Elevated D-dimer.

CT ANGIOGRAPHY CHEST
TECHNIQUE: Multidetector CT imaging of the chest using the
standard protocol during bolus administration of intravenous
contrast. Multiplanar reconstructed images including MIPs were
obtained and reviewed to evaluate the vascular anatomy.
Contrast: 100mL OMNIPAQUE IOHEXOL 350 MG/ML SOLN

[Series 7: pulm embolism 1.0 b25f thin · axial · 0.79mm/px · z∈[-207,+66]mm · 16 of 301 slices shown]
[im 14/301  lung]
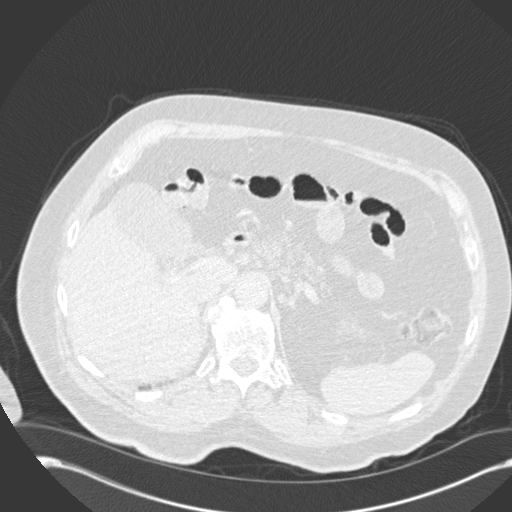
[im 40/301  soft-tissue]
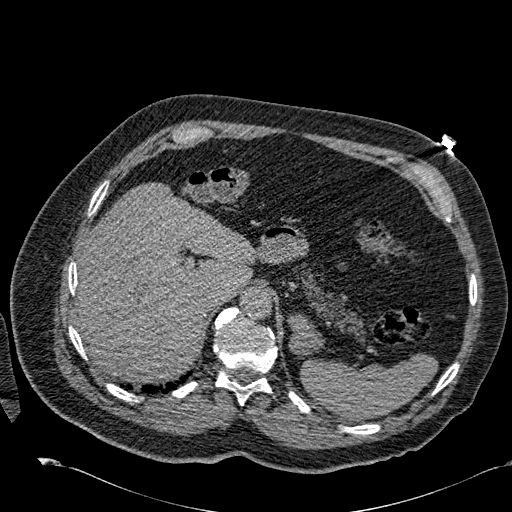
[im 53/301  lung]
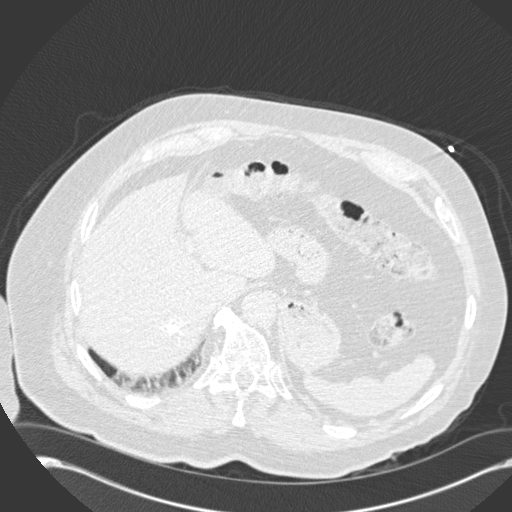
[im 66/301  soft-tissue]
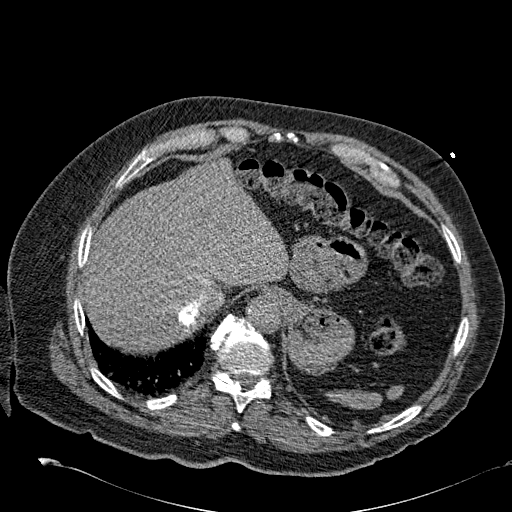
[im 92/301  lung]
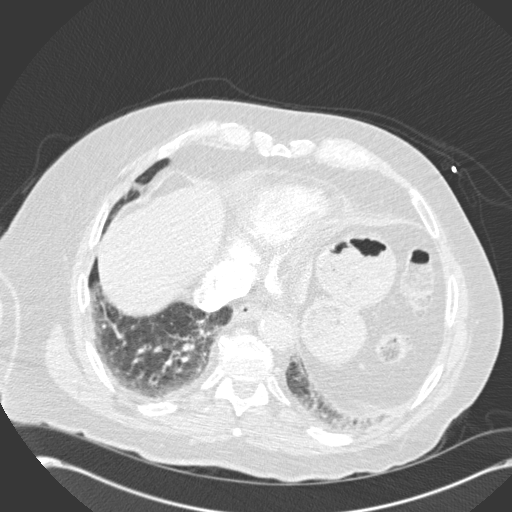
[im 105/301  soft-tissue]
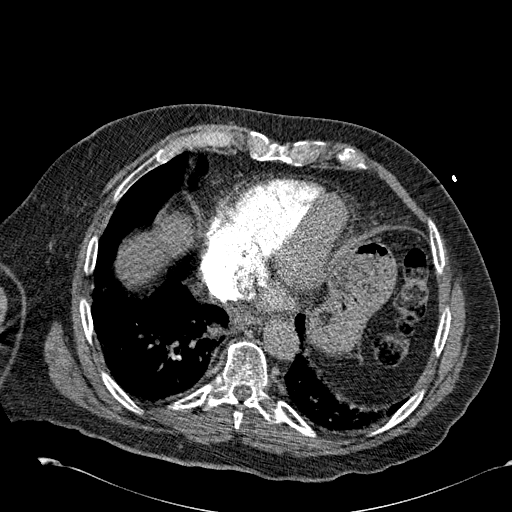
[im 118/301  lung]
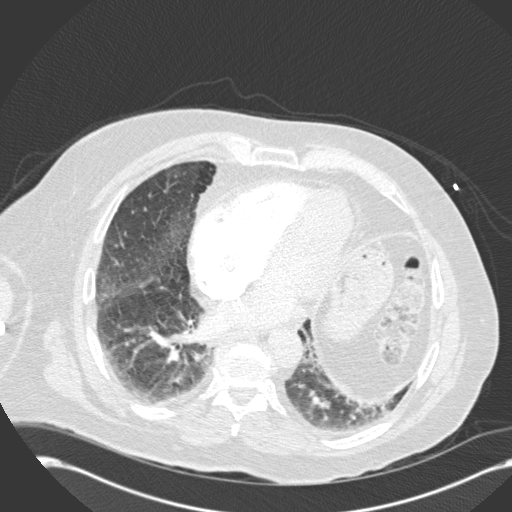
[im 144/301  soft-tissue]
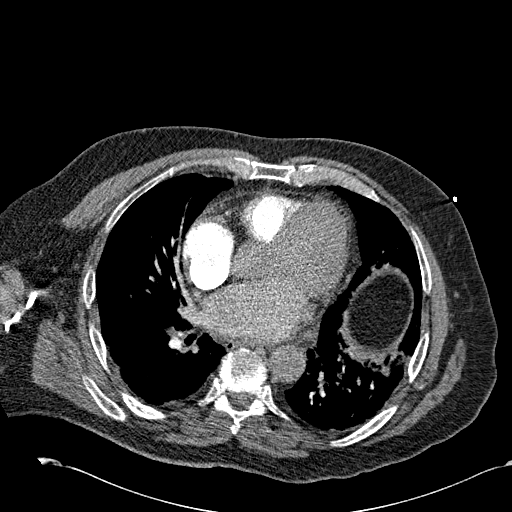
[im 157/301  lung]
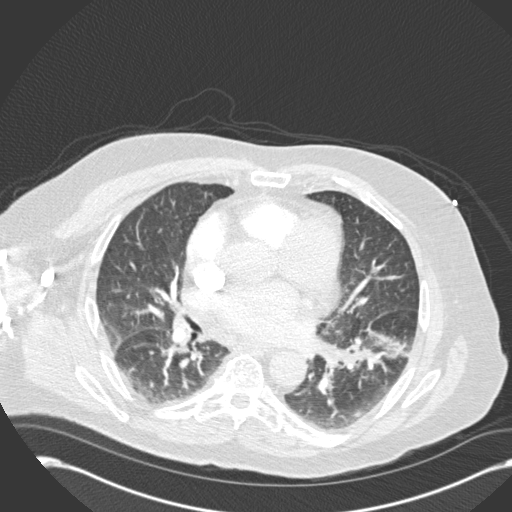
[im 183/301  soft-tissue]
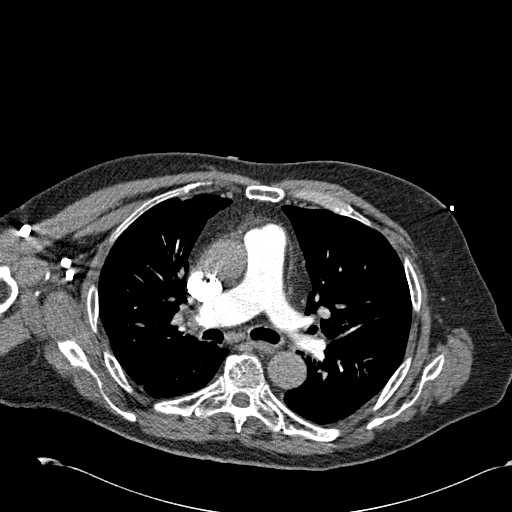
[im 196/301  lung]
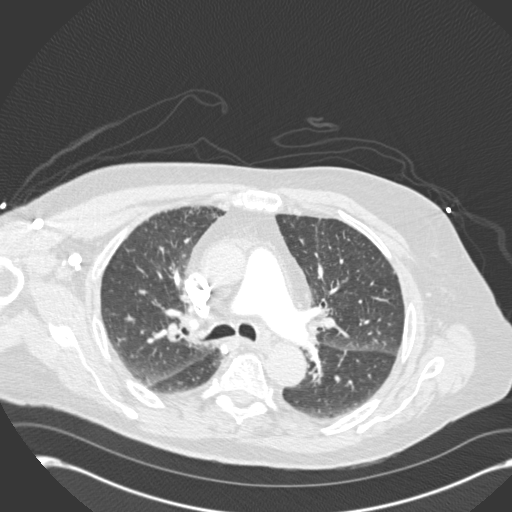
[im 209/301  soft-tissue]
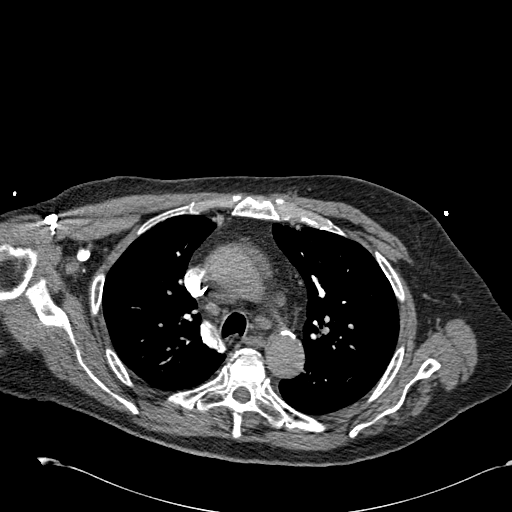
[im 235/301  lung]
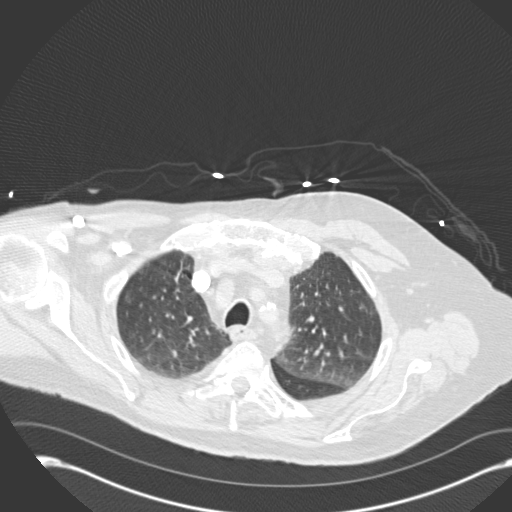
[im 248/301  soft-tissue]
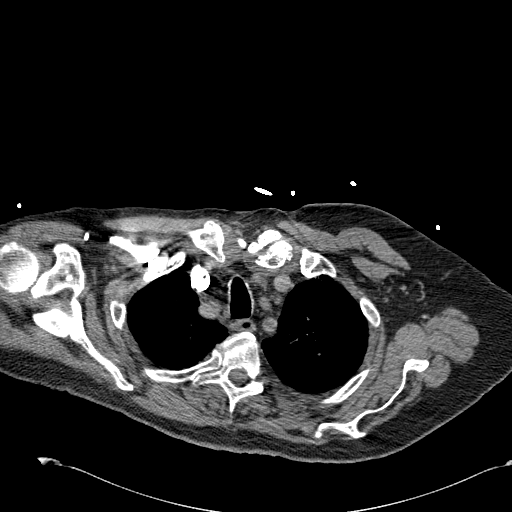
[im 261/301  lung]
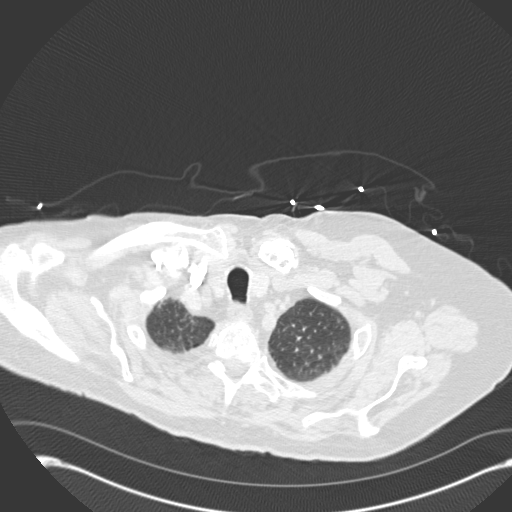
[im 287/301  soft-tissue]
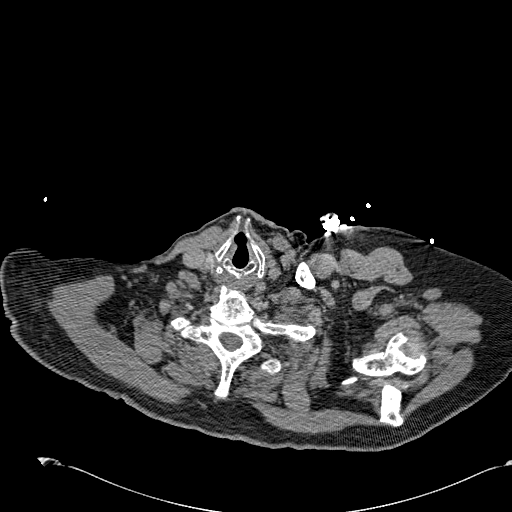

[Series 602: coronal · coronal · 0.79mm/px · 3 of 89 slices shown]
[im 23/89  soft-tissue]
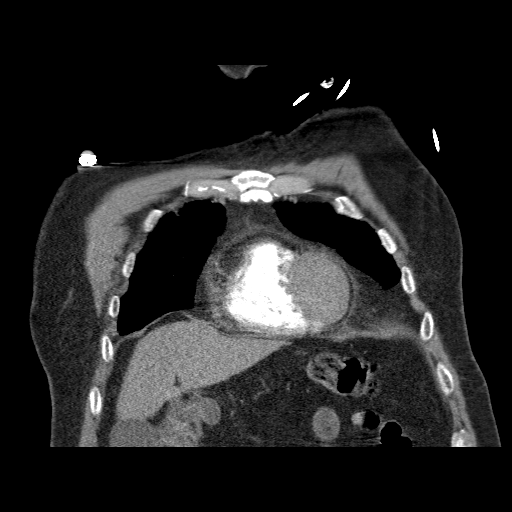
[im 45/89  soft-tissue]
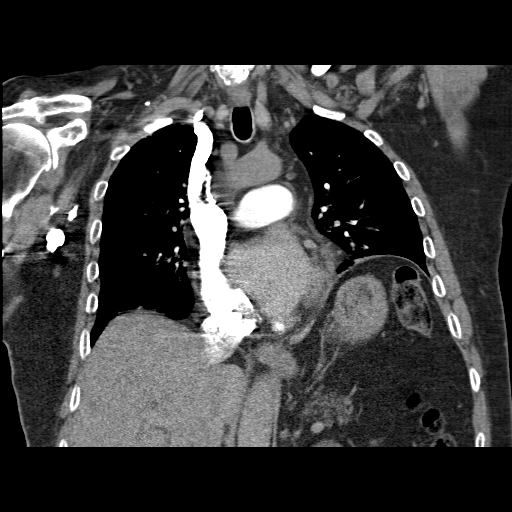
[im 67/89  soft-tissue]
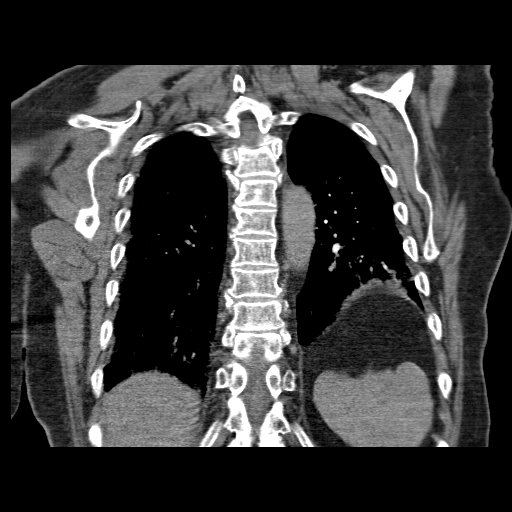

[19 of 46 positions shown; findings below may reference images not displayed]

FINDINGS: No filling defects in the pulmonary arteries to suggest
pulmonary emboli. Heart is normal size. Aorta is normal caliber.
Scattered small mediastinal lymph nodes, some which are partially
calcified.  No hilar or axillary adenopathy.

Left base atelectasis.   No pleural effusions.  No acute bony
abnormality.
IMPRESSION: No evidence of pulmonary embolus.

The left base atelectasis.

## 2014-08-03 ENCOUNTER — Other Ambulatory Visit: Payer: Self-pay | Admitting: Cardiovascular Disease

## 2015-01-04 NOTE — Progress Notes (Signed)
Patient ID: Justin HessJames Baker, male   DOB: [birth year], 81 y.o.   MRN: [id number] 81 y.o.  hospitalized for chest pain and PAF 2013 . DCC on Eliquis. Bad hips and cannot walk on treadmill. Since D/C some weakness but maintaining NSR. Amiodarone stopped  TEE done in hospital to R/O clot showed EF 40-45%  Study Conclusions  - Left ventricle: Systolic function was mildly to moderately reduced. The estimated ejection fraction was in the range of 40% to 45%. - Aortic valve: No evidence of vegetation. - Mitral valve: No evidence of vegetation. Mild regurgitation. - Left atrium: The atrium was moderately to severely dilated. No evidence of thrombus in the atrial cavity or appendage. - Right ventricle: Systolic function was mildly reduced. - Right atrium: The atrium was moderately dilated. - Atrial septum: No defect or patent foramen ovale was identified.  Myovue 07/11/12 normal EF 65% Maint NSR and will stop Eliquis at this point  Had right hip replaced 20 years ago [facility] although may have PAF Still has not decided if he is having enough pain in left hip to warrant surgery   Seen in ER 07/01/14   For bronchitis and had rapid afib  He was unaware of it Had URI after flu shot and was anxious because his wife was Being admitted with pneumonia   Having a time with allergies  And sinus HA  ROS: Denies fever, malais, weight loss, blurry vision, decreased visual acuity, cough, sputum, SOB, hemoptysis, pleuritic pain, palpitaitons, heartburn, abdominal pain, melena, lower extremity edema, claudication, or rash.  All other systems reviewed and negative  General: Affect appropriate Healthy:  appears stated age HEENT: normal Neck supple with no adenopathy JVP normal no bruits no thyromegaly Lungs clear with no wheezing and good diaphragmatic motion Heart:  S1/S2 no murmur, no rub, gallop or click PMI normal Abdomen: benighn, BS positve, no  tenderness, no AAA no bruit.  No HSM or HJR Distal pulses intact with no bruits No edema Neuro non-focal Skin warm and dry No muscular weakness   Current Outpatient Prescriptions  Medication Sig Dispense Refill  . Glucos-Chond-Sterol-Fish Oil (GLUCOSAMINE CHONDROITIN PLUS PO) Take 30 mLs by mouth daily.    . metoprolol tartrate (LOPRESSOR) 25 MG tablet Take 0.5 tablets (12.5 mg total) by mouth 2 (two) times daily. 30 tablet 5   No current facility-administered medications for this visit.    Allergies  Review of patient's allergies indicates no known allergies.  Electrocardiogram:  2014  AFIB RATE 148  Nonspecific ST changes  07/02/14  07/09/2014   SR rate 59  LVH   Assessment and Plan PAF:  Maint NSR no palpitations.  Continue beta blocker start baby aspirin  Allergies:  Discussed OTC claritin or allegra wearing mask during yard work Arthritis:  Some help with glucosamine worse in hips and knees. F/u orhto  NSAI' for pain   Justin Baker Justin Baker

## 2015-01-06 ENCOUNTER — Ambulatory Visit (INDEPENDENT_AMBULATORY_CARE_PROVIDER_SITE_OTHER): Payer: Medicare Other | Admitting: Cardiovascular Disease

## 2015-01-06 ENCOUNTER — Encounter: Payer: Self-pay | Admitting: Cardiovascular Disease

## 2015-01-06 VITALS — BP 136/76 | HR 65 | Ht 70.0 in | Wt 198.6 lb

## 2015-01-06 DIAGNOSIS — I480 Paroxysmal atrial fibrillation: Secondary | ICD-10-CM

## 2015-01-06 NOTE — Patient Instructions (Signed)
Medication Instructions:  NO CHANGES  Labwork: NONE  Testing/Procedures: NONE  Follow-Up: Your physician wants you to follow-up in: YEAR WITH  DR NISHAN You will receive a reminder letter in the mail two months in advance. If you don't receive a letter, please call our office to schedule the follow-up appointment.   Any Other Special Instructions Will Be Listed Below (If Applicable).   

## 2015-01-30 ENCOUNTER — Other Ambulatory Visit: Payer: Self-pay | Admitting: Cardiovascular Disease

## 2015-05-21 DIAGNOSIS — R4702 Dysphasia: Secondary | ICD-10-CM | POA: Insufficient documentation

## 2015-05-22 DIAGNOSIS — I63412 Cerebral infarction due to embolism of left middle cerebral artery: Secondary | ICD-10-CM | POA: Insufficient documentation

## 2015-06-16 ENCOUNTER — Ambulatory Visit (INDEPENDENT_AMBULATORY_CARE_PROVIDER_SITE_OTHER): Payer: Medicare Other | Admitting: Cardiology

## 2015-06-16 ENCOUNTER — Encounter: Payer: Self-pay | Admitting: Cardiology

## 2015-06-16 VITALS — BP 150/62 | HR 49 | Ht 67.5 in | Wt 201.0 lb

## 2015-06-16 DIAGNOSIS — I4891 Unspecified atrial fibrillation: Secondary | ICD-10-CM

## 2015-06-16 MED ORDER — METOPROLOL TARTRATE 25 MG PO TABS: 25.0000 mg | ORAL_TABLET | Freq: Two times a day (BID) | ORAL | Status: DC

## 2015-06-16 MED ORDER — APIXABAN 2.5 MG PO TABS: 2.5000 mg | ORAL_TABLET | Freq: Two times a day (BID) | ORAL | Status: DC

## 2015-06-16 NOTE — Progress Notes (Signed)
06/16/2015 Justin Baker   1921/12/31  [id number]  Primary Physician No PCP Per Patient Primary Cardiologist: Dr. Eden Baker   Reason for Visit/CC: Hospital follow-up for CVA in the setting of paroxysmal atrial fibrillation  HPI:  The patient is a 81 year old male, followed by Dr. Eden Baker, with a history of paroxysmal atrial fibrillation, first diagnosed in 2013. Due to high bleeding risk given his advanced age and high fall risk, he was not felt to be a candidate for long-term oral anticoagulation. He was previously on Eliquis but this was discontinued for the previous stated reasons. He also has a prior history of mild LV systolic dysfunction which has since improved. Echocardiogram 04/2012 revealed an ejection fraction of 40-45%. However he had a Myoview 07/11/2012 that was normal. EF at that time 65%.  He presents to clinic today for post hospital. 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 Per discharge summary, he was not a candidate for TPA. He was noted to be in and out of atrial fibrillation during the time of his admission. Per records, an echocardiogram was obtained which revealed normal ejection fraction at 60-65%. There was mild concentric left ventricular hypertrophy. There was a structurally normal mitral valve with good mobility and mild mitral regurgitation. Aortic valve was also structurally normal with no significant stenosis or regurgitation. There was no evidence of pericardial effusion. Also no evidence of left atrial clot by TTE. It does not appear that a TEE was performed. It  was also noted that [facility] felt that he would be high risk for anticoagulation due to risk of bleeding and risk for falls. Neurology recommended to continue aspirin plus Plavix. The patient reports that he went to inpatient rehab and continued to have issues with recurrent atrial  fibrillation. He was then seen by a cardiologist in [place] at the inpatient rehab center who increased his metoprolol to 50 mg BID also recommended that he restart Eliquis, at a dose of 5 mg BID. His ASA and Plavix were both discontinued.   Since increasing his metoprolol, he denies any recurrent palpitations. He also feels more reassured now that he is back on Eliquis. He understands his bleed risk but verbalized that he wishes to continue to reduce risk of recurrent stroke. He denies any abnormal bleeding. He also denies any falls.   EKG today shows sinus bradycardia with a HR of 45 bpm. He denies dizziness, syncope/ near syncope.      Current Outpatient Prescriptions  Medication Sig Dispense Refill  . apixaban (ELIQUIS) 2.5 MG TABS tablet Take 1 tablet (2.5 mg total) by mouth 2 (two) times daily. 180 tablet 3  . atorvastatin (LIPITOR) 40 MG tablet Take 40 mg by mouth daily.    . metoprolol (LOPRESSOR) 25 MG tablet Take 1 tablet (25 mg total) by mouth 2 (two) times daily. 180 tablet 3   No current facility-administered medications for this visit.    No Known Allergies  Social History   Social History  . Marital Status: Married    Spouse Name: [name]  . Number of Children: [name]  . Years of Education: [name]   Occupational History  . Not on file.   Social History Main Topics  . Smoking status: Former Games developermoker  . Smokeless tobacco: Former NeurosurgeonUser    Quit date: 08/23/1976  . Alcohol Use: No  . Drug Use: No  . Sexual Activity: Not  on file   Other Topics Concern  . Not on file   Social History Narrative     Review of Systems: General: negative for chills, fever, night sweats or weight changes.  Cardiovascular: negative for chest pain, dyspnea on exertion, edema, orthopnea, palpitations, paroxysmal nocturnal dyspnea or shortness of breath Dermatological: negative for rash Respiratory: negative for cough or wheezing Urologic: negative for hematuria Abdominal: negative for nausea,  vomiting, diarrhea, bright red blood per rectum, melena, or hematemesis Neurologic: negative for visual changes, syncope, or dizziness All other systems reviewed and are otherwise negative except as noted above.    Blood pressure 150/62, pulse 49, height 5' 7.5" (1.715 m), weight 201 lb (91.173 kg), SpO2 97 %.  General appearance: alert, cooperative and no distress Neck: no carotid bruit and no JVD Lungs: clear to auscultation bilaterally Heart: regular rate and rhythm, S1, S2 normal, no murmur, click, rub or gallop Extremities: no LEE Pulses: 2+ and symmetric Skin: warm and dry Neurologic: Grossly normal  EKG sinus bradycardia. 45 bpm. 1st degree AVB.   ASSESSMENT AND PLAN:   1. PAF: currently in SR but with market resting bradycardia with a ventricular rate of 45 bpm. He is asymptomatic with his bradycardia, but given his advanced age and fact that he is on Eliquis, we will reduce his metoprolol down from 50 mg to 25 mg BID to avoid dizziness and syncope. He denies any recurrent afib. If he has any recurrence of afib, may need to consider restarting amiodarone, if we are unable to control his afib with rate control strategy given limitations due to HR. Patient wishes to continue Eliquis, despite bleed risk.  2. CVA: speech is improved but not back to 100%. He denies any worsening or new symptoms. He wishes to continue Eliquis for secondary prevention, despite his fall risk. He understands the pros and cons of continuation and still wishes to continue. Despite his advanced age, he appears to have no major cognitive impairment and is fully capable of making decisions. His son is with him today and confirms his ability to make decisions and agrees with his choice to continue Eliquis. He knows to seek emergent medical attention if any falls, especially any falls that may result in head trauma. He was discharged from [facility] on 5 mg BID. However he is >39 years of age and his SCr is 1.53. I've  recommended that we reduce his dose of Eliquis to 2.5 mg BID to reduce bleed risk. Continue atorvastatin for secondary prevention.   PLAN  F/u with Dr. Eden Emms in 6 weeks for repeat evaluation.   Robbie Lis PA-C 06/16/2015 12:33 PM

## 2015-06-16 NOTE — Patient Instructions (Signed)
Medication Instructions:  Your physician has recommended you make the following change in your medication:  1- Reduce Eliquis 2.5 mg by mouth twice daily.  2- Reduce Metoprolol 25 mg by mouth twice daily.  Labwork: NONE  Testing/Procedures: NONE  Follow-Up: Your physician recommends that you schedule a follow-up appointment in: 6 to 8 weeks with [name] or 1st available.   Any Other Special Instructions Will Be Listed Below (If Applicable).     If you need a refill on your cardiac medications before your next appointment, please call your pharmacy.

## 2015-07-30 ENCOUNTER — Telehealth: Payer: Self-pay | Admitting: Cardiovascular Disease

## 2015-07-30 ENCOUNTER — Other Ambulatory Visit: Payer: Self-pay | Admitting: *Deleted

## 2015-07-30 MED ORDER — ATORVASTATIN CALCIUM 40 MG PO TABS: 40.0000 mg | ORAL_TABLET | Freq: Every day | ORAL | Status: DC

## 2015-07-30 NOTE — Telephone Encounter (Signed)
°*  STAT* If patient is at the pharmacy, call can be transferred to refill team.   1. Which medications need to be refilled? (please list name of each medication and dose if known) Atorvastatin  2. Which pharmacy/location (including street and city if local pharmacy) is medication to be sent to?[phone]  3. Do they need a 30 day or 90 day supply? 30 and refills

## 2015-08-27 ENCOUNTER — Other Ambulatory Visit: Payer: Self-pay | Admitting: Cardiovascular Disease

## 2015-08-31 NOTE — Progress Notes (Signed)
Patient ID: Justin Baker, male   DOB: 28-May-1922, 82 y.o.   MRN: [id number]   09/02/2015 Justin Baker   28-May-1922  [id number]  Primary Physician No PCP Per Patient Primary Cardiologist: [name]   Reason for Visit/CC:  Hospital follow-up for CVA in the setting of paroxysmal atrial fibrillation  HPI:  The patient is a 82 y.o.  Male, with a history of paroxysmal atrial fibrillation, first diagnosed in 2013. Due to high bleeding risk given his advanced age and high fall risk, he was not felt to be a candidate for long-term oral anticoagulation. He was previously on Eliquis but this was discontinued for the previous stated reasons. He also has a prior history of mild LV systolic dysfunction which has since improved. Echocardiogram 04/2012 revealed an ejection fraction of 40-45%. However he had a Myoview 07/11/2012 that was normal. EF at that time 65%.  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 Per discharge summary, he was not a candidate for TPA. He was noted to be in and out of atrial fibrillation during the time of his admission. Per records, an echocardiogram was obtained which revealed normal ejection fraction at 60-65%. There was mild concentric left ventricular hypertrophy. There was a structurally normal mitral valve with good mobility and mild mitral regurgitation. Aortic valve was also structurally normal with no significant stenosis or regurgitation. There was no evidence of pericardial effusion. Also no evidence of left atrial clot by TTE. It does not appear that a TEE was performed. It  was also noted that the neurologist at [facility] felt that he would be high risk for anticoagulation due to risk of bleeding and risk for falls. Neurology recommended to continue aspirin plus Plavix. The patient reports that he went to inpatient rehab and continued to have issues with recurrent atrial fibrillation.  He was then seen by a cardiologist in [place] at the inpatient rehab center who increased his metoprolol to 50 mg BID also recommended that he restart Eliquis, at a dose of 5 mg BID. His ASA and Plavix were both discontinued.   Since increasing his metoprolol, he denies any recurrent palpitations. He also feels more reassured now that he is back on Eliquis. He understands his bleed risk but verbalized that he wishes to continue to reduce risk of recurrent stroke. He denies any abnormal bleeding. He also denies any falls.    He denies dizziness, syncope/ near syncope.  Seen by PA in October and beta blocker reduced for HR less than 50    Current Outpatient Prescriptions  Medication Sig Dispense Refill  . apixaban (ELIQUIS) 2.5 MG TABS tablet Take 1 tablet (2.5 mg total) by mouth 2 (two) times daily. 180 tablet 3  . atorvastatin (LIPITOR) 40 MG tablet TAKE ONE (1) TABLET EACH DAY 30 tablet 10  . metoprolol (LOPRESSOR) 25 MG tablet Take 1 tablet (25 mg total) by mouth 2 (two) times daily. 180 tablet 3  . Misc Natural Products (OSTEO BI-FLEX ADV JOINT SHIELD PO) Take 2 tablets by mouth daily.     No current facility-administered medications for this visit.    No Known Allergies  Social History   Social History  . Marital Status: Married    Spouse Name: [name]  . Number of Children: [name]  . Years of Education: [name]   Occupational History  . Not on file.   Social History Main Topics  . Smoking status: Former  Smoker  . Smokeless tobacco: Former Neurosurgeon    Quit date: 08/23/1976  . Alcohol Use: No  . Drug Use: No  . Sexual Activity: Not on file   Other Topics Concern  . Not on file   Social History Narrative     Review of Systems: General: negative for chills, fever, night sweats or weight changes.  Cardiovascular: negative for chest pain, dyspnea on exertion, edema, orthopnea, palpitations, paroxysmal nocturnal dyspnea or shortness of breath Dermatological: negative for rash Respiratory:  negative for cough or wheezing Urologic: negative for hematuria Abdominal: negative for nausea, vomiting, diarrhea, bright red blood per rectum, melena, or hematemesis Neurologic: negative for visual changes, syncope, or dizziness All other systems reviewed and are otherwise negative except as noted above.    Blood pressure 130/62, pulse 85, height 5\' 5"  (1.651 m), weight 90.774 kg (200 lb 1.9 oz).  General appearance: alert, cooperative and no distress Neck: no carotid bruit and no JVD Lungs: clear to auscultation bilaterally Heart: regular rate and rhythm, S1, S2 normal, no murmur, click, rub or gallop Extremities: no LEE Pulses: 2+ and symmetric Skin: warm and dry Neurologic: Grossly normal  EKG  06/16/15   sinus bradycardia. 45 bpm. 1st degree AVB.   ASSESSMENT AND PLAN:   1. PAF: currently in SR  He denies any recurrent afib. If he has any recurrence of afib, may need to consider restarting amiodarone, if we are unable to control his afib with rate control strategy given limitations due to HR. Patient wishes to continue Eliquis, despite bleed risk.  2. CVA: speech is improved but not back to 100%. He denies any worsening or new symptoms. He wishes to continue Eliquis for secondary prevention, despite his fall risk. He understands the pros and cons of continuation and still wishes to continue. Despite his advanced age, he appears to have no major cognitive impairment and is fully capable of making decisions. His son is with him today and confirms his ability to make decisions and agrees with his choice to continue Eliquis. He knows to seek emergent medical attention if any falls, especially any falls that may result in head trauma. He was discharged from [facility] on 5 mg BID. However he is >38 years of age and his SCr is 1.53. I've recommended that we reduce his dose of Eliquis to 2.5 mg BID to reduce bleed risk. Continue atorvastatin for secondary prevention.   PLAN  F/u with me in a year     [name]  09/02/2015 11:15 AM

## 2015-09-02 ENCOUNTER — Encounter: Payer: Self-pay | Admitting: Cardiovascular Disease

## 2015-09-02 ENCOUNTER — Ambulatory Visit (INDEPENDENT_AMBULATORY_CARE_PROVIDER_SITE_OTHER): Payer: Medicare Other | Admitting: Cardiovascular Disease

## 2015-09-02 VITALS — BP 130/62 | HR 85 | Ht 65.0 in | Wt 200.1 lb

## 2015-09-02 DIAGNOSIS — I4891 Unspecified atrial fibrillation: Secondary | ICD-10-CM

## 2015-09-02 DIAGNOSIS — R931 Abnormal findings on diagnostic imaging of heart and coronary circulation: Secondary | ICD-10-CM | POA: Diagnosis not present

## 2015-09-02 NOTE — Patient Instructions (Addendum)

## 2015-11-10 ENCOUNTER — Inpatient Hospital Stay (HOSPITAL_BASED_OUTPATIENT_CLINIC_OR_DEPARTMENT_OTHER)
Admission: EM | Admit: 2015-11-10 | Discharge: 2015-11-13 | DRG: 244 | Disposition: A | Discharge status: Home / Self Care | Payer: Medicare Other | Attending: Internal Medicine | Admitting: Internal Medicine

## 2015-11-10 ENCOUNTER — Emergency Department (HOSPITAL_BASED_OUTPATIENT_CLINIC_OR_DEPARTMENT_OTHER): Payer: Medicare Other

## 2015-11-10 ENCOUNTER — Encounter (HOSPITAL_BASED_OUTPATIENT_CLINIC_OR_DEPARTMENT_OTHER): Payer: Self-pay | Admitting: Emergency Medicine

## 2015-11-10 DIAGNOSIS — Z79899 Other long term (current) drug therapy: Secondary | ICD-10-CM | POA: Diagnosis not present

## 2015-11-10 DIAGNOSIS — I4891 Unspecified atrial fibrillation: Secondary | ICD-10-CM | POA: Diagnosis not present

## 2015-11-10 DIAGNOSIS — Z96641 Presence of right artificial hip joint: Secondary | ICD-10-CM | POA: Diagnosis present

## 2015-11-10 DIAGNOSIS — R072 Precordial pain: Secondary | ICD-10-CM | POA: Diagnosis not present

## 2015-11-10 DIAGNOSIS — J449 Chronic obstructive pulmonary disease, unspecified: Secondary | ICD-10-CM | POA: Diagnosis present

## 2015-11-10 DIAGNOSIS — I480 Paroxysmal atrial fibrillation: Secondary | ICD-10-CM | POA: Diagnosis present

## 2015-11-10 DIAGNOSIS — E785 Hyperlipidemia, unspecified: Secondary | ICD-10-CM | POA: Diagnosis present

## 2015-11-10 DIAGNOSIS — R079 Chest pain, unspecified: Secondary | ICD-10-CM | POA: Insufficient documentation

## 2015-11-10 DIAGNOSIS — Z87891 Personal history of nicotine dependence: Secondary | ICD-10-CM

## 2015-11-10 DIAGNOSIS — Z8673 Personal history of transient ischemic attack (TIA), and cerebral infarction without residual deficits: Secondary | ICD-10-CM | POA: Diagnosis not present

## 2015-11-10 DIAGNOSIS — G8929 Other chronic pain: Secondary | ICD-10-CM | POA: Diagnosis present

## 2015-11-10 DIAGNOSIS — M549 Dorsalgia, unspecified: Secondary | ICD-10-CM | POA: Diagnosis present

## 2015-11-10 DIAGNOSIS — I209 Angina pectoris, unspecified: Secondary | ICD-10-CM | POA: Diagnosis not present

## 2015-11-10 DIAGNOSIS — Z7901 Long term (current) use of anticoagulants: Secondary | ICD-10-CM | POA: Diagnosis not present

## 2015-11-10 DIAGNOSIS — Z959 Presence of cardiac and vascular implant and graft, unspecified: Secondary | ICD-10-CM

## 2015-11-10 DIAGNOSIS — I495 Sick sinus syndrome: Secondary | ICD-10-CM | POA: Diagnosis present

## 2015-11-10 HISTORY — DX: Cerebral infarction, unspecified: I63.9

## 2015-11-10 LAB — BASIC METABOLIC PANEL
ANION GAP: 11 (ref 5–15)
BUN: 23 mg/dL — ABNORMAL HIGH (ref 6–20)
CHLORIDE: 107 mmol/L (ref 101–111)
CO2: 22 mmol/L (ref 22–32)
Calcium: 9 mg/dL (ref 8.9–10.3)
Creatinine, Ser: 1.65 mg/dL — ABNORMAL HIGH (ref 0.61–1.24)
GFR calc non Af Amer: 35 mL/min — ABNORMAL LOW (ref >60)
GFR, EST AFRICAN AMERICAN: 40 mL/min — AB (ref >60)
Glucose, Bld: 156 mg/dL — ABNORMAL HIGH (ref 65–99)
POTASSIUM: 4 mmol/L (ref 3.5–5.1)
Sodium: 140 mmol/L (ref 135–145)

## 2015-11-10 LAB — CBC WITH DIFFERENTIAL/PLATELET
BASOS PCT: 0 %
Basophils Absolute: 0 10*3/uL (ref 0.0–0.1)
Eosinophils Absolute: 0.1 10*3/uL (ref 0.0–0.7)
Eosinophils Relative: 1 %
HCT: 47.7 % (ref 39.0–52.0)
HEMOGLOBIN: 16 g/dL (ref 13.0–17.0)
LYMPHS ABS: 3.3 10*3/uL (ref 0.7–4.0)
LYMPHS PCT: 18 %
MCH: 32.9 pg (ref 26.0–34.0)
MCHC: 33.5 g/dL (ref 30.0–36.0)
MCV: 98.1 fL (ref 78.0–100.0)
MONOS PCT: 7 %
Monocytes Absolute: 1.4 10*3/uL — ABNORMAL HIGH (ref 0.1–1.0)
NEUTROS ABS: 14.1 10*3/uL — AB (ref 1.7–7.7)
NEUTROS PCT: 74 %
PLATELETS: 151 10*3/uL (ref 150–400)
RBC: 4.86 MIL/uL (ref 4.22–5.81)
RDW: 13.2 % (ref 11.5–15.5)
WBC: 19 10*3/uL — ABNORMAL HIGH (ref 4.0–10.5)

## 2015-11-10 LAB — TROPONIN I

## 2015-11-10 MED ORDER — ESMOLOL HCL-SODIUM CHLORIDE 2000 MG/100ML IV SOLN
25.0000 ug/kg/min | INTRAVENOUS | Status: DC
  Administered 2015-11-11: 50 ug/kg/min via INTRAVENOUS
  Administered 2015-11-11: 25 ug/kg/min via INTRAVENOUS
  Filled 2015-11-10 (×2): qty 100

## 2015-11-10 MED ORDER — ONDANSETRON HCL 4 MG/2ML IJ SOLN: 4.0000 mg | Freq: Three times a day (TID) | INTRAMUSCULAR | Status: AC | PRN

## 2015-11-10 MED ORDER — MORPHINE SULFATE (PF) 2 MG/ML IV SOLN
1.0000 mg | Freq: Once | INTRAVENOUS | Status: AC
  Administered 2015-11-10: 1 mg via INTRAVENOUS
  Filled 2015-11-10: qty 1

## 2015-11-10 MED ORDER — METOPROLOL TARTRATE 1 MG/ML IV SOLN
5.0000 mg | Freq: Once | INTRAVENOUS | Status: AC
  Administered 2015-11-10: 5 mg via INTRAVENOUS
  Filled 2015-11-10: qty 5

## 2015-11-10 MED ORDER — NITROGLYCERIN 0.4 MG SL SUBL
0.4000 mg | SUBLINGUAL_TABLET | SUBLINGUAL | Status: AC | PRN
  Administered 2015-11-10 (×3): 0.4 mg via SUBLINGUAL
  Filled 2015-11-10: qty 1

## 2015-11-10 NOTE — Plan of Care (Signed)
82 yo M Sp[facility] to [facility] Radford [facility] History of A.fib on Eliquis, COPD,  Here with Chest pain starting around 2-3 PM , took Aspirin, initially BP up to 180's. ER gave nitro improved BP chest pain at 3- 4 Initial ECG non-ischemic, CXR non acute. First troponin wnl  Since patient not chest pain free accepted to 3 [facility] stepdown  Kensie Susman 7:10 PM

## 2015-11-10 NOTE — ED Notes (Signed)
MD at bedside. 

## 2015-11-10 NOTE — ED Provider Notes (Signed)
CSN: [id number]     Arrival date & time 11/10/15  1732 History   By signing my name below, I, Justin Baker, attest that this documentation has been prepared under the direction and in the presence of [name], MD. Electronically Signed: Budd Baker, ED Scribe. 11/10/2015. 5:55 PM.    Chief Complaint  Patient presents with  . Chest Pain   The history is provided by the patient and a relative. No language interpreter was used.   HPI Comments: Justin Baker is a 82 y.o. male former smoker with a PMHx of A-fib, COPD, and ARF who presents to the Emergency Department complaining of constant, central chest pain radiating into the mid back onset 3-4 hours ago. Pt states that his back pain now feels worse than the chest pain. He has taken one aspirin today and is currently on Eliquis for a stroke that occurred in September 2016. He notes a PMHx of similar pains, at which time he was diagnosed with A-fib, but states that "this time is different." He reports that he typically has intermittent A-fib occuring every few weeks, with associated chest pain, but never this severe. Pt denies abdominal pain.  Past Medical History  Diagnosis Date  . COPD (chronic obstructive pulmonary disease) (HCC)     self diagnosed  . Osteoarthritis     s/p right total hip  . Atrial fibrillation (HCC)     anticoagulated with Eliquis   . Back pain   . Shoulder pain, bilateral   . Leukocytosis   . Thrombocytopenia (HCC)   . ARF (acute renal failure) (HCC)   . Stroke [facility])    Past Surgical History  Procedure Laterality Date  . Total hip arthroplasty      right  . Cataract extraction w/ intraocular lens implant      right  . Tee without cardioversion  05/17/2012    Procedure: TRANSESOPHAGEAL ECHOCARDIOGRAM (TEE);  Surgeon: Dolores Patty, MD;  Location: [facility] ENDOSCOPY;  Service: Cardiovascular;  Laterality: [name];  . Cardioversion  05/17/2012    Procedure: CARDIOVERSION;  Surgeon: Dolores Patty, MD;   Location: [facility] ENDOSCOPY;  Service: Cardiovascular;  Laterality: [name];  . Ep implantable device [name] 11/12/2015    Procedure: Pacemaker Implant;  Surgeon: Duke Salvia, MD;  Location: [facility] INVASIVE CV LAB;  Service: Cardiovascular;  Laterality: [name];   Family History  Problem Relation Age of Onset  . Other      No known heart disease  . Tuberculosis Father    Social History  Substance Use Topics  . Smoking status: Former Games developer  . Smokeless tobacco: Former Neurosurgeon    Quit date: 08/23/1976  . Alcohol Use: No    Review of Systems  Cardiovascular: Positive for chest pain.  Gastrointestinal: Negative for abdominal pain.  All other systems reviewed and are negative.   Allergies  Review of patient's allergies indicates no known allergies.  Home Medications   Prior to Admission medications   Medication Sig Start Date End Date Taking? Authorizing Provider  apixaban (ELIQUIS) 2.5 MG TABS tablet Take 1 tablet (2.5 mg total) by mouth 2 (two) times daily. 06/16/15  Yes [name], PA-C  atorvastatin (LIPITOR) 40 MG tablet TAKE ONE (1) TABLET EACH DAY 08/27/15  Yes [name], MD  Misc Natural Products (OSTEO BI-FLEX ADV JOINT SHIELD PO) Take 2 tablets by mouth daily.   Yes Historical Provider, MD  amiodarone (PACERONE) 200 MG tablet Take 1 tablet (200 mg total) by mouth daily. 11/20/15  [name], NP  metoprolol (LOPRESSOR) 50 MG tablet 50 mg 2 (two) times daily. 11/13/15   Historical Provider, MD  metoprolol tartrate (LOPRESSOR) 25 MG tablet Take 25 mg by mouth 2 (two) times daily.    Historical Provider, MD   BP 115/97 mmHg  Pulse 78  Temp(Src) 98.2 F (36.8 C) (Oral)  Resp 20  Ht  (1.753 m)  Wt 194 lb 12.8 oz (88.361 kg)  BMI 28.75 kg/m2  SpO2 100% Physical Exam Physical Exam  Nursing note and vitals reviewed. Constitutional: He is oriented to person, place, and time. He appears well-developed and well-nourished. No distress.  HENT:  Head: Normocephalic and  atraumatic.  Eyes: Pupils are equal, round, and reactive to light.  Neck: Normal range of motion.  Cardiovascular: Normal rate and intact distal pulses.   Pulmonary/Chest: No respiratory distress.  Abdominal: Normal appearance. He exhibits no distension.  Musculoskeletal: Normal range of motion.  Neurological: He is alert and oriented to person, place, and time. No cranial nerve deficit.  Skin: Skin is warm and dry. No rash noted.  Psychiatric: He has a normal mood and affect. His behavior is normal.   ED Course  Procedures  DIAGNOSTIC STUDIES: Oxygen Saturation is 98% on RA, normal by my interpretation.    COORDINATION OF CARE: 5:54 PM - Discussed plans to order a dose of NTG and IV fluids. Will order diagnostic studies and admit pt to hospital. Pt advised of plan for treatment and pt agrees. Medications  ondansetron (ZOFRAN) injection 4 mg (not administered)  0.9 %  sodium chloride infusion ( Intravenous Stopped 11/12/15 1408)  nitroGLYCERIN (NITROSTAT) SL tablet 0.4 mg (0.4 mg Sublingual Given 11/10/15 1831)  morphine 2 MG/ML injection 1 mg (1 mg Intravenous Given 11/10/15 1919)  metoprolol (LOPRESSOR) injection 5 mg (5 mg Intravenous Given 11/10/15 2050)  gentamicin (GARAMYCIN) 80 mg in sodium chloride irrigation 0.9 % 500 mL irrigation (80 mg Irrigation Given 11/12/15 1013)  chlorhexidine (HIBICLENS) 4 % liquid 4 application (4 application Topical Given 11/12/15 0546)  ceFAZolin (ANCEF) IVPB 2 g/50 mL premix (2 g Intravenous Given 11/12/15 0939)  ceFAZolin (ANCEF) IVPB 1 g/50 mL premix (1 g Intravenous Given 11/13/15 0336)    Labs Review Labs Reviewed  BASIC METABOLIC PANEL - Abnormal; Notable for the following:    Glucose, Bld 156 (*)    BUN 23 (*)    Creatinine, Ser 1.65 (*)    GFR calc non Af Amer 35 (*)    GFR calc Af Amer 40 (*)    All other components within normal limits  CBC WITH DIFFERENTIAL/PLATELET - Abnormal; Notable for the following:    WBC 19.0 (*)    Neutro Abs  14.1 (*)    Monocytes Absolute 1.4 (*)    All other components within normal limits  TROPONIN I - Abnormal; Notable for the following:    Troponin I 0.04 (*)    All other components within normal limits  TROPONIN I - Abnormal; Notable for the following:    Troponin I 0.04 (*)    All other components within normal limits  TROPONIN I - Abnormal; Notable for the following:    Troponin I 0.08 (*)    All other components within normal limits  BASIC METABOLIC PANEL - Abnormal; Notable for the following:    Glucose, Bld 112 (*)    BUN 22 (*)    Creatinine, Ser 1.62 (*)    Calcium 8.5 (*)    GFR calc non  Af Amer 35 (*)    GFR calc Af Amer 41 (*)    All other components within normal limits  CBC - Abnormal; Notable for the following:    WBC 12.0 (*)    Platelets 147 (*)    All other components within normal limits  MRSA PCR SCREENING  TROPONIN I    Imaging Review DG Chest Portable 1 View (Final result) Result time: 11/10/15 18:25:24   Final result by Rad Results In Interface (11/10/15 18:25:24)   Narrative:   CLINICAL DATA: Central chest pain radiating into the back since yesterday. History of COPD and atrial fibrillation.  EXAM: PORTABLE CHEST 1 VIEW  COMPARISON: 07/01/2014 radiographs. CT 05/15/2012.  FINDINGS: 1805 hours. The heart size and mediastinal contours are stable with chronic aortic ectasia. There is chronic elevation of the left hemidiaphragm with associated left basilar atelectasis or scarring. There is chronic central airway thickening, but no edema, confluent airspace opacity or pleural effusion. No acute osseous findings are evident. There are degenerative changes within the spine. There is evidence of chronic rotator cuff tears bilaterally.  IMPRESSION: Stable chronic central airway thickening and left basilar atelectasis/ scarring. No acute cardiopulmonary process.   Electronically Signed By: Carey BullocksWilliam Veazey M.D. On: 11/10/2015 18:25       EKG Interpretation   Date/Time:  Monday November 10 2015 17:49:26 EDT Ventricular Rate:  61 PR Interval:  187 QRS Duration: 94 QT Interval:  395 QTC Calculation: 398 R Axis:   8 Text Interpretation:  Sinus rhythm Supraventricular bigeminy Abnormal  R-wave progression, early transition Borderline repolarization abnormality  Abnormal ekg Confirmed by Krishay Faro  MD, Cejay Cambre ([id number]) on 11/10/2015 6:23:57  PM      MDM   Final diagnoses:  Chest pain, unspecified chest pain type    I personally performed the services described in this documentation, which was scribed in my presence. The recorded information has been reviewed and considered.   Nelva Nayobert Jenevie Casstevens, MD 11/22/15 [phone]

## 2015-11-10 NOTE — ED Notes (Signed)
Attempted to call report to [id number] receiving nurse. Nurse not available for report and will return call to me.

## 2015-11-10 NOTE — ED Notes (Signed)
Pt converted to NSR with rat of 73 after lopressor.

## 2015-11-10 NOTE — ED Notes (Signed)
Patient reports that he is having pain in his chest radiating to his mid back. Reports that he has a history of a.fib and this is how he felt then. The patient reports that he has multiple other symptoms with this pain

## 2015-11-11 DIAGNOSIS — I4891 Unspecified atrial fibrillation: Secondary | ICD-10-CM

## 2015-11-11 DIAGNOSIS — I495 Sick sinus syndrome: Secondary | ICD-10-CM | POA: Insufficient documentation

## 2015-11-11 DIAGNOSIS — R072 Precordial pain: Secondary | ICD-10-CM

## 2015-11-11 DIAGNOSIS — R079 Chest pain, unspecified: Secondary | ICD-10-CM | POA: Insufficient documentation

## 2015-11-11 LAB — TROPONIN I
TROPONIN I: 0.04 ng/mL — AB (ref <0.031)
TROPONIN I: 0.04 ng/mL — AB (ref <0.031)
TROPONIN I: 0.08 ng/mL — AB (ref <0.031)

## 2015-11-11 LAB — MRSA PCR SCREENING: MRSA by PCR: NEGATIVE

## 2015-11-11 MED ORDER — SODIUM CHLORIDE 0.9 % IV SOLN
INTRAVENOUS | Status: DC
  Administered 2015-11-12: 06:00:00 via INTRAVENOUS

## 2015-11-11 MED ORDER — SODIUM CHLORIDE 0.9 % IR SOLN
80.0000 mg | Status: AC
  Administered 2015-11-12: 80 mg

## 2015-11-11 MED ORDER — CHLORHEXIDINE GLUCONATE 4 % EX LIQD
60.0000 mL | Freq: Once | CUTANEOUS | Status: DC
  Filled 2015-11-11: qty 30

## 2015-11-11 MED ORDER — CEFAZOLIN SODIUM-DEXTROSE 2-3 GM-% IV SOLR
2.0000 g | INTRAVENOUS | Status: AC
  Administered 2015-11-12: 2 g via INTRAVENOUS

## 2015-11-11 MED ORDER — SODIUM CHLORIDE 0.9% FLUSH
3.0000 mL | Freq: Two times a day (BID) | INTRAVENOUS | Status: DC
  Administered 2015-11-11 – 2015-11-13 (×4): 3 mL via INTRAVENOUS

## 2015-11-11 MED ORDER — ATORVASTATIN CALCIUM 40 MG PO TABS
40.0000 mg | ORAL_TABLET | Freq: Every day | ORAL | Status: DC
  Administered 2015-11-11 – 2015-11-12 (×2): 40 mg via ORAL
  Filled 2015-11-11 (×2): qty 1

## 2015-11-11 MED ORDER — CHLORHEXIDINE GLUCONATE 4 % EX LIQD
60.0000 mL | Freq: Once | CUTANEOUS | Status: AC
  Administered 2015-11-12: 4 via TOPICAL

## 2015-11-11 MED ORDER — APIXABAN 2.5 MG PO TABS
2.5000 mg | ORAL_TABLET | Freq: Two times a day (BID) | ORAL | Status: DC
  Administered 2015-11-11: 2.5 mg via ORAL
  Filled 2015-11-11: qty 1

## 2015-11-11 NOTE — Progress Notes (Addendum)
The client went back into Afib. An EKG was taken and vital signs are HR 115 to 140's, BP 123/64, RR 19, O2 94 on room air. The client also has a headache. I paged Onalee HuaAlvarez and he ordered the esmolol gtt to be restarted at 50 mg and titrate to get the HR to stay less than 120.  After about 10 minutes(please see strips and MAR) on the gtt the client had a 4.08 sec pause and converted back to NSR with a heart rate in the 70's to 80's. During the pause I was in the clients room and he said he felt very dizzy and lightheaded.   I spoke to DarrouzettAlvarez and ordered to stop the esmolol gtt and continue to monitor the client for return of Afib and to restart the gtt at 50 mg if this occurs. The clients headache is now gone, he says it does that with the Afib. I will continue to monitor the client closely.   Elita Booneina Ayers RN

## 2015-11-11 NOTE — Progress Notes (Signed)
Will hold Eliquis in anticipation of PPM implant 11/12/15

## 2015-11-11 NOTE — H&P (Signed)
[facility] History and Physical  Justin Baker ZOX:[id number] DOB: 1922/05/16 DOA: 11/10/2015  Referring physician: EDP PCP: No PCP Per Patient   Chief Complaint: Chest pain   HPI: Justin Baker is a 82 y.o. male who presents to the ED at [facility] with c/o CP starting around 2-3PM.  He took ASA.  Initially BP up to 180s, ER gave NTG which improved BP and chest pain went down to 2-3.  Morphine caused resolution of chest pain and patient having no chest pain at [facility] on arrival.  Patient has a h/o A.Fib but has been in NSR for the past year he states.  Initially today he was A.Fib in the ED, has been converting in and out of this on arrival to [facility] but has been persistently in A.Fib RVR for the most part since arrival.  He has been having symptomatic dizziness episodes for the past couple of months and proceeds to have 2 of these since arrival to [facility] 3W unit.  These episodes correlate exactly with sinus pauses of 3.75 and 3.88 seconds on the monitor.  Review of Systems: Systems reviewed.  As above, otherwise negative  Past Medical History  Diagnosis Date  . COPD (chronic obstructive pulmonary disease) (HCC)     self diagnosed  . Osteoarthritis     s/p right total hip  . Atrial fibrillation (HCC)     anticoagulated with Eliquis   . Back pain   . Shoulder pain, bilateral   . Leukocytosis   . Thrombocytopenia (HCC)   . ARF (acute renal failure) (HCC)   . Stroke [facility])    Past Surgical History  Procedure Laterality Date  . Total hip arthroplasty      right  . Cataract extraction w/ intraocular lens implant      right  . Tee without cardioversion  05/17/2012    Procedure: TRANSESOPHAGEAL ECHOCARDIOGRAM (TEE);  Surgeon: Dolores Patty, MD;  Location: [facility] ENDOSCOPY;  Service: Cardiovascular;  Laterality: [name];  . Cardioversion  05/17/2012    Procedure: CARDIOVERSION;  Surgeon: Dolores Patty, MD;  Location: [facility] ENDOSCOPY;  Service: Cardiovascular;  Laterality: [name];   Social History:   reports that he has quit smoking. He quit smokeless tobacco use about 39 years ago. He reports that he does not drink alcohol or use illicit drugs.  No Known Allergies  Family History  Problem Relation Age of Onset  . Other      No known heart disease  . Tuberculosis Father      Prior to Admission medications   Medication Sig Start Date End Date Taking? Authorizing Provider  apixaban (ELIQUIS) 2.5 MG TABS tablet Take 1 tablet (2.5 mg total) by mouth 2 (two) times daily. 06/16/15   [name], PA-C  atorvastatin (LIPITOR) 40 MG tablet TAKE ONE (1) TABLET EACH DAY 08/27/15   [name], MD  metoprolol (LOPRESSOR) 25 MG tablet Take 1 tablet (25 mg total) by mouth 2 (two) times daily. 06/16/15   [name], PA-C  Misc Natural Products (OSTEO BI-FLEX ADV JOINT SHIELD PO) Take 2 tablets by mouth daily.    Historical Provider, MD   Physical Exam: Filed Vitals:   11/11/15 0030 11/11/15 0035  BP: 101/82 98/75  Pulse: 124 102  Temp:    Resp: 20 22    BP 98/75 mmHg  Pulse 102  Temp(Src) 98.9 F (37.2 C) (Oral)  Resp 22  Wt 90.8 kg (200 lb 2.8 oz)  SpO2 93%  General Appearance:  Alert, oriented, no distress, appears stated age  Head:    Normocephalic, atraumatic  Eyes:    PERRL, EOMI, sclera non-icteric        Nose:   Nares without drainage or epistaxis. Mucosa, turbinates normal  Throat:   Moist mucous membranes. Oropharynx without erythema or exudate.  Neck:   Supple. No carotid bruits.  No thyromegaly.  No lymphadenopathy.   Back:     No CVA tenderness, no spinal tenderness  Lungs:     Clear to auscultation bilaterally, without wheezes, rhonchi or rales  Chest wall:    No tenderness to palpitation  Heart:    Regular rate and rhythm without murmurs, gallops, rubs  Abdomen:     Soft, non-tender, nondistended, normal bowel sounds, no organomegaly  Genitalia:    deferred  Rectal:    deferred  Extremities:   No clubbing, cyanosis or edema.  Pulses:   2+ and  symmetric all extremities  Skin:   Skin color, texture, turgor normal, no rashes or lesions  Lymph nodes:   Cervical, supraclavicular, and axillary nodes normal  Neurologic:   CNII-XII intact. Normal strength, sensation and reflexes      throughout    Labs on Admission:  Basic Metabolic Panel:  Recent Labs Lab 11/10/15 1755  NA 140  K 4.0  CL 107  CO2 22  GLUCOSE 156*  BUN 23*  CREATININE 1.65*  CALCIUM 9.0   Liver Function Tests: No results for input(s): AST, ALT, ALKPHOS, BILITOT, PROT, ALBUMIN in the last 168 hours. No results for input(s): LIPASE, AMYLASE in the last 168 hours. No results for input(s): AMMONIA in the last 168 hours. CBC:  Recent Labs Lab 11/10/15 1755  WBC 19.0*  NEUTROABS 14.1*  HGB 16.0  HCT 47.7  MCV 98.1  PLT 151   Cardiac Enzymes:  Recent Labs Lab 11/10/15 1755  TROPONINI <0.03    BNP (last 3 results) No results for input(s): PROBNP in the last 8760 hours. CBG: No results for input(s): GLUCAP in the last 168 hours.  Radiological Exams on Admission: Dg Chest Portable 1 View  11/10/2015  CLINICAL DATA:  Central chest pain radiating into the back since yesterday. History of COPD and atrial fibrillation. EXAM: PORTABLE CHEST 1 VIEW COMPARISON:  07/01/2014 radiographs.  CT 05/15/2012. FINDINGS: 1805 hours. The heart size and mediastinal contours are stable with chronic aortic ectasia. There is chronic elevation of the left hemidiaphragm with associated left basilar atelectasis or scarring. There is chronic central airway thickening, but no edema, confluent airspace opacity or pleural effusion. No acute osseous findings are evident. There are degenerative changes within the spine. There is evidence of chronic rotator cuff tears bilaterally. IMPRESSION: Stable chronic central airway thickening and left basilar atelectasis/ scarring. No acute cardiopulmonary process. Electronically Signed   By: Carey Bullocks M.D.   On: 11/10/2015 18:25     EKG: Independently reviewed.  Assessment/Plan Principal Problem:   Chest pain Active Problems:   Atrial fibrillation with rapid ventricular response (HCC)   1. Chest pain - 1. Serial trops 2. Tele monitor 3. Suspect that there is a rate mediated component here with A.Fib RVR into the 140s at times and pauses at other times. 4. Have already consulted cardiology, spoke with Dr. Sandie Ano who hopefully will see the patient ASAP 2. A.Fib RVR - 1. Concerned due to pauses on monitor 2. Dr. Sandie Ano still recommends starting rate control meds 3. Specifically he recommends esmolol as first line agent due to very short  half life, if pauses worsen can shut the gtt off and have it out of system most rapidly. 4. Will go ahead and try this for rate control 5. Continue apixaban for anticoagulation 6. Cardiology consulted 7. If pauses worsen, have instructed RN to DC gtt immediately and contact cardiology.    Code Status: Full  Family Communication: Family at bedside Disposition Plan: Admit to inpatient   Time spent: 70 min  GARDNER, JARED M. [facility] Pager [phone]  If 7AM-7PM, please contact the day team taking care of the patient [email or web address] Password [email or web address] 11/11/2015, 12:38 AM

## 2015-11-11 NOTE — Consult Note (Signed)
ELECTROPHYSIOLOGY CONSULT NOTE    Patient ID: Justin Baker MRN: [id number], DOB/AGE: [birth year] 82 y.o.  Admit date: 11/10/2015 Date of Consult: 11/11/2015  Primary Physician: No PCP Per Patient Primary Cardiologist: Eden Emms Requesting Physician: Myrtis Ser  Reason for Consultation: atrial fibrillation   HPI:  Justin Baker is a 82 y.o. male with a past medical history significant for COPD, prior CVA, and paroxysmal atrial fibrillation. He has been followed by Dr Eden Emms for the past 3 years after being diagnosed with atrial fibrillation in 2014.  He reports that he has had recent increased AF burden associated with some chest tightness and palpitations. He has been treated with Metoprolol and Eliquis. He also has had intermittent dizzy spells that are not positional and last for seconds.  He presented to the ER on the day of admission with shoulder tightness and was found to be in AF with RVR. He was placed on Esmolol with subsequent pauses of up to 4 seconds that reminded him of his dizzy spells at home.  EP has been asked to evaluate for treatment options.   Echo 05/2015 at [facility] at time of CVA demonstrated normal ejection fraction at 60-65%. There was mild concentric left ventricular hypertrophy. There was a structurally normal mitral valve with good mobility and mild mitral regurgitation. Aortic valve was also structurally normal with no significant stenosis or regurgitation. There was no evidence of pericardial effusion.   He currently denies chest pain, shortness of breath, recent fevers, chills, nausea or vomiting. He is the primary caregiver for his wife at home who has dementia.  He remains active without functional limitations at home.   Past Medical History  Diagnosis Date  . COPD (chronic obstructive pulmonary disease) (HCC)     self diagnosed  . Osteoarthritis     s/p right total hip  . Atrial fibrillation (HCC)     anticoagulated with Eliquis   . Back pain   .  Shoulder pain, bilateral   . Leukocytosis   . Thrombocytopenia (HCC)   . ARF (acute renal failure) (HCC)   . Stroke [facility])      Surgical History:  Past Surgical History  Procedure Laterality Date  . Total hip arthroplasty      right  . Cataract extraction w/ intraocular lens implant      right  . Tee without cardioversion  05/17/2012    Procedure: TRANSESOPHAGEAL ECHOCARDIOGRAM (TEE);  Surgeon: Dolores Patty, MD;  Location: [facility] ENDOSCOPY;  Service: Cardiovascular;  Laterality: [name];  . Cardioversion  05/17/2012    Procedure: CARDIOVERSION;  Surgeon: Dolores Patty, MD;  Location: [facility] ENDOSCOPY;  Service: Cardiovascular;  Laterality: [name];     Prescriptions prior to admission  Medication Sig Dispense Refill Last Dose  . apixaban (ELIQUIS) 2.5 MG TABS tablet Take 1 tablet (2.5 mg total) by mouth 2 (two) times daily. 180 tablet 3 11/10/2015 at 9a  . atorvastatin (LIPITOR) 40 MG tablet TAKE ONE (1) TABLET EACH DAY 30 tablet 10 11/10/2015 at Unknown time  . metoprolol (LOPRESSOR) 25 MG tablet Take 1 tablet (25 mg total) by mouth 2 (two) times daily. 180 tablet 3 11/10/2015 at 9a  . Misc Natural Products (OSTEO BI-FLEX ADV JOINT SHIELD PO) Take 2 tablets by mouth daily.   11/10/2015 at Unknown time    Inpatient Medications:  . apixaban  2.5 mg Oral BID  . atorvastatin  40 mg Oral q1800  . sodium chloride flush  3 mL Intravenous Q12H    Allergies:  No Known Allergies  Social History   Social History  . Marital Status: Married    Spouse Name: [name]  . Number of Children: [name]  . Years of Education: [name]   Occupational History  . Not on file.   Social History Main Topics  . Smoking status: Former Games developermoker  . Smokeless tobacco: Former NeurosurgeonUser    Quit date: 08/23/1976  . Alcohol Use: No  . Drug Use: No  . Sexual Activity: Not on file   Other Topics Concern  . Not on file   Social History Narrative     Family History  Problem Relation Age of Onset  . Other      No known heart  disease  . Tuberculosis Father      Review of Systems: All other systems reviewed and are otherwise negative except as noted above.  Physical Exam: Filed Vitals:   11/11/15 0522 11/11/15 0530 11/11/15 0745 11/11/15 0837  BP: 121/55 110/48 110/48   Pulse: 40 26 26   Temp:   98.9 F (37.2 C)   TempSrc:   Oral   Resp: 17 19 19    Height:    5\' 9"  (1.753 m)  Weight:  194 lb 11.2 oz (88.315 kg)    SpO2: 91% 93% 95%     GEN- The patient is elderly appearing, alert and oriented x 3 today.   HEENT: normocephalic, atraumatic; sclera clear, conjunctiva pink; hearing intact; oropharynx clear; neck supple  Lungs- Clear to ausculation bilaterally, normal work of breathing.  No wheezes, rales, rhonchi Heart- Regular rate and rhythm, no murmurs, rubs or gallops  GI- soft, non-tender, non-distended, bowel sounds present  Extremities- no clubbing, cyanosis, or edema; DP/PT/radial pulses 2+ bilaterally MS- no significant deformity or atrophy Skin- warm and dry, no rash or lesion Psych- euthymic mood, full affect Neuro- strength and sensation are intact  Labs:   Lab Results  Component Value Date   WBC 19.0* 11/10/2015   HGB 16.0 11/10/2015   HCT 47.7 11/10/2015   MCV 98.1 11/10/2015   PLT 151 11/10/2015    Recent Labs Lab 11/10/15 1755  NA 140  K 4.0  CL 107  CO2 22  BUN 23*  CREATININE 1.65*  CALCIUM 9.0  GLUCOSE 156*      Radiology/Studies: Dg Chest Portable 1 View 11/10/2015  CLINICAL DATA:  Central chest pain radiating into the back since yesterday. History of COPD and atrial fibrillation. EXAM: PORTABLE CHEST 1 VIEW COMPARISON:  07/01/2014 radiographs.  CT 05/15/2012. FINDINGS: 1805 hours. The heart size and mediastinal contours are stable with chronic aortic ectasia. There is chronic elevation of the left hemidiaphragm with associated left basilar atelectasis or scarring. There is chronic central airway thickening, but no edema, confluent airspace opacity or pleural effusion.  No acute osseous findings are evident. There are degenerative changes within the spine. There is evidence of chronic rotator cuff tears bilaterally. IMPRESSION: Stable chronic central airway thickening and left basilar atelectasis/ scarring. No acute cardiopulmonary process. Electronically Signed   By: Carey BullocksWilliam  Veazey M.D.   On: 11/10/2015 18:25    XBJ:YNWGNEKG:sinus rhythm with frequent PAC's  TELEMETRY: atrial fibrillation with RVR, up to 4 second post termination pauses, SR with frequent atrial ectopy   Assessment/Plan: 1.  Paroxysmal atrial fibrillation with RVR and up to 4 second symptomatic post termination pauses The patient has atrial fibrillation with RVR as well as symptomatic post termination pauses. PPM is indicated at this time. Risks, benefits to PPM implantation were reviewed with  the patient today who wishes to proceed. Will plan at the next available time.  Continue Eliquis for CHADS2VASC of 4 (2.5mg  bid for age >21 and creat >1.5)  Dr Johney Frame to see later today   Signed, Gypsy Balsam, NP 11/11/2015 10:54 AM   I have seen, examined the patient, and reviewed the above assessment and plan.  On exam, tachycardic irregular rhythm. Changes to above are made where necessary.  The patient has atrial fibrillation with RVR as well as symptomatic sinus bradycardia with pauses documented and presyncope.  No reversible causes have been found.  I would therefore recommend pacemaker implantation at this time.  Risks, benefits, alternatives to pacemaker implantation were discussed in detail with the patient today. The patient understands that the risks include but are not limited to bleeding, infection, pneumothorax, perforation, tamponade, vascular damage, renal failure, MI, stroke, death,  and lead dislodgement and wishes to proceed. We will therefore schedule the procedure at the next available time.     Co Sign: Hillis Range, MD 11/11/2015

## 2015-11-11 NOTE — Progress Notes (Signed)
[facility]                                                                              Patient Demographics  Justin Baker, is a 82 y.o. male, DOB - 14-Jun-1922, ZOX:[id number]  Admit date - 11/10/2015   Admitting Physician Therisa Doyne, MD  Outpatient Primary MD for the patient is No PCP Per Patient  LOS - 1  days    Chief Complaint  Patient presents with  . Chest Pain       Brief HPI   Justin Baker is a 82 y.o. male who presents to the ED at [facility] with c/o CP starting around 2-3PM. He took ASA. Initially BP up to 180s, ER gave NTG which improved BP and chest pain went down to 2-3. Morphine caused resolution of chest pain and patient having no chest pain at [facility] on arrival. Patient has a h/o A.Fib but has been in NSR for the past year he states. Initially today he was A.Fib in the ED, has been converting in and out of this on arrival to [facility] but has been persistently in A.Fib RVR for the most part since arrival. He has been having symptomatic dizziness episodes for the past couple of months and had 2 of these episodes since admission.  These episodes correlated exactly with sinus pauses of 3.75 and 3.88 seconds on the monitor.   Assessment & Plan    Principal Problem:   Chest pain - Currently chest pain resolved, possibly due to atrial fibrillation with RVR - Cardiology consulted, troponin slightly +0.042 - will follow cardiology recommendations  Active Problems:   Atrial fibrillation with rapid ventricular response (HCC) with sinus pauses - EP cardiology also consulted, will follow recommendations, may need pacemaker - Continue medical regulation with eliquis  COPD - Currently stable, no wheezing  Chronic back pain - Currently stable  Hyperlipidemia - Continue Lipitor  Code Status: full code  Family Communication: Discussed in detail with the patient, all imaging results, lab results explained to the patient   Disposition Plan: Pending  cardiology and EP evaluation  Time Spent in minutes  25 minutes  Procedures  None  Consults   Cardiology, EP   DVT Prophylaxis  apixaban  Medications  Scheduled Meds: . apixaban  2.5 mg Oral BID  . atorvastatin  40 mg Oral q1800  . sodium chloride flush  3 mL Intravenous Q12H   Continuous Infusions: . esmolol 50 mcg/kg/min (11/11/15 0500)   PRN Meds:.   Antibiotics   Anti-infectives    None        Subjective:   Justin Baker was seen and examined today. Patient denies any specific complaints, no chest pain, shortness of breath, dizziness. No fevers or chills. Overnight events noted.  Patient denies abdominal pain, N/V/D/C, new weakness, numbess, tingling. No acute events overnight.    Objective:   Filed Vitals:   11/11/15 0745 11/11/15 0837 11/11/15 1200 11/11/15 1217  BP: 110/48  119/80   Pulse: 26  106   Temp: 98.9 F (37.2 C)   98.7 F (37.1 C)  TempSrc: Oral     Resp: 19  21  Height:  5\' 9"  (1.753 m)    Weight:      SpO2: 95%  95%     Intake/Output Summary (Last 24 hours) at 11/11/15 1233 Last data filed at 11/11/15 1139  Gross per 24 hour  Intake    120 ml  Output    200 ml  Net    -80 ml     Wt Readings from Last 3 Encounters:  11/11/15 88.315 kg (194 lb 11.2 oz)  09/02/15 90.774 kg (200 lb 1.9 oz)  06/16/15 91.173 kg (201 lb)     Exam  General: Alert and oriented x 3, NAD  HEENT:  .   Neck: Supple, no JVD  CVS: S1 S2 clear, irregularly irregular, no murmurs  Respiratory: CTAB  Abdomen: Soft, nontender, nondistended, + bowel sounds  Ext: no cyanosis clubbing or edema  Neuro: AAOx3, Cr N's II- XII. Strength 5/5 upper and lower extremities bilaterally  Skin: No rashes  Psych: Normal affect and demeanor, alert and oriented x3    Data Reviewed:  I have personally reviewed following labs and imaging studies  Micro Results Recent Results (from the past 240 hour(s))  MRSA PCR Screening     Status: None   Collection Time:  11/10/15 10:26 PM  Result Value Ref Range Status   MRSA by PCR NEGATIVE NEGATIVE Final    Comment:        The GeneXpert MRSA Assay (FDA approved for NASAL specimens only), is one component of a comprehensive MRSA colonization surveillance program. It is not intended to diagnose MRSA infection nor to guide or monitor treatment for MRSA infections.     Radiology Reports Dg Chest Portable 1 View  11/10/2015  CLINICAL DATA:  Central chest pain radiating into the back since yesterday. History of COPD and atrial fibrillation. EXAM: PORTABLE CHEST 1 VIEW COMPARISON:  07/01/2014 radiographs.  CT 05/15/2012. FINDINGS: 1805 hours. The heart size and mediastinal contours are stable with chronic aortic ectasia. There is chronic elevation of the left hemidiaphragm with associated left basilar atelectasis or scarring. There is chronic central airway thickening, but no edema, confluent airspace opacity or pleural effusion. No acute osseous findings are evident. There are degenerative changes within the spine. There is evidence of chronic rotator cuff tears bilaterally. IMPRESSION: Stable chronic central airway thickening and left basilar atelectasis/ scarring. No acute cardiopulmonary process. Electronically Signed   By: Carey BullocksWilliam  Veazey M.D.   On: 11/10/2015 18:25    CBC  Recent Labs Lab 11/10/15 1755  WBC 19.0*  HGB 16.0  HCT 47.7  PLT 151  MCV 98.1  MCH 32.9  MCHC 33.5  RDW 13.2  LYMPHSABS 3.3  MONOABS 1.4*  EOSABS 0.1  BASOSABS 0.0    Chemistries   Recent Labs Lab 11/10/15 1755  NA 140  K 4.0  CL 107  CO2 22  GLUCOSE 156*  BUN 23*  CREATININE 1.65*  CALCIUM 9.0   ------------------------------------------------------------------------------------------------------------------ estimated creatinine clearance is 32 mL/min (by C-G formula based on Cr of 1.65). ------------------------------------------------------------------------------------------------------------------ No  results for input(s): HGBA1C in the last 72 hours. ------------------------------------------------------------------------------------------------------------------ No results for input(s): CHOL, HDL, LDLCALC, TRIG, CHOLHDL, LDLDIRECT in the last 72 hours. ------------------------------------------------------------------------------------------------------------------ No results for input(s): TSH, T4TOTAL, T3FREE, THYROIDAB in the last 72 hours.  Invalid input(s): FREET3 ------------------------------------------------------------------------------------------------------------------ No results for input(s): VITAMINB12, FOLATE, FERRITIN, TIBC, IRON, RETICCTPCT in the last 72 hours.  Coagulation profile No results for input(s): INR, PROTIME in the last 168 hours.  No results for input(s): DDIMER in the  last 72 hours.  Cardiac Enzymes  Recent Labs Lab 11/10/15 1755 11/11/15 0111 11/11/15 0638  TROPONINI <0.03 0.04* 0.04*   ------------------------------------------------------------------------------------------------------------------ Invalid input(s): POCBNP  No results for input(s): GLUCAP in the last 72 hours.   Elane Peabody M.D. [facility] 11/11/2015, 12:33 PM  Pager: [phone] Between 7am to 7pm - call Pager - [phone]  After 7pm go to [email or web address] - password [email or web address]  Call night coverage person covering after 7pm

## 2015-11-11 NOTE — Progress Notes (Signed)
New IV obtained. Hibiclens complete, but pt will be going for PPM tomorrow. Cecille Rubinhompson,Brach Birdsall V, RN

## 2015-11-11 NOTE — Consult Note (Addendum)
Referring Physician: Lyda Perone Primary Physician: Primary Cardiologist: Reason for Consultation: tachy brady   HPI: 82 yo man h/o stroke and AF who presented to [facility] with c/o CP starting around 2-3PM. He states he can typically feel his AF.  Reports some "swimmy headed" episodes, couple of times per day for about 1 year now but more frequent now than previously.  On arrival he was in AF RVR.  Since being up in 3W, he had 2 episodes of "swimmy headedness" associated with ~ 3.5 sec pauses prompting cardiology consultation.   Prior Note: The patient is a 82 y.o. Male, with a history of paroxysmal atrial fibrillation, first diagnosed in 2013. Due to high bleeding risk given his advanced age and high fall risk, he was not felt to be a candidate for long-term oral anticoagulation. He was previously on Eliquis but this was discontinued for the previous stated reasons. He also has a prior history of mild LV systolic dysfunction which has since improved. Echocardiogram 04/2012 revealed an ejection fraction of 40-45%. However he had a Myoview 07/11/2012 that was normal. EF at that time 65%.  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 Per discharge summary, he was not a candidate for TPA. He was noted to be in and out of atrial fibrillation during the time of his admission. Per records, an echocardiogram was obtained which revealed normal ejection fraction at 60-65%. There was mild concentric left ventricular hypertrophy. There was a structurally normal mitral valve with good mobility and mild mitral regurgitation. Aortic valve was also structurally normal with no significant stenosis or regurgitation. There was no evidence of pericardial effusion. Also no evidence of left atrial clot by TTE. It does not appear that a TEE was performed. It was also noted that the neurologist at [facility] felt that he would be  high risk for anticoagulation due to risk of bleeding and risk for falls. Neurology recommended to continue aspirin plus Plavix. The patient reports that he went to inpatient rehab and continued to have issues with recurrent atrial fibrillation. He was then seen by a cardiologist in [place] at the inpatient rehab center who increased his metoprolol to 50 mg BID also recommended that he restart Eliquis, at a dose of 5 mg BID. His ASA and Plavix were both discontinued.   Review of Systems:     Cardiac Review of Systems: {Y] = yes [ ]  = no  Chest Pain [    ]  Resting SOB [   ] Exertional SOB  [  ]  Orthopnea [  ]   Pedal Edema [   ]    Palpitations [  ] Syncope  [  ]   Presyncope [   ]  General Review of Systems: [Y] = yes [  ]=no Constitional: recent weight change [  ]; anorexia [  ]; fatigue [  ]; nausea [  ]; night sweats [  ]; fever [  ]; or chills [  ];                                                                      Eyes : blurred vision [  ];  diplopia [   ]; vision changes [  ];  Amaurosis fugax[  ]; Resp: cough [  ];  wheezing[  ];  hemoptysis[  ];  PND [  ];  GI:  gallstones[  ], vomiting[  ];  dysphagia[  ]; melena[  ];  hematochezia [  ]; heartburn[  ];   GU: kidney stones [  ]; hematuria[  ];   dysuria [  ];  nocturia[  ]; incontinence [  ];             Skin: rash, swelling[  ];, hair loss[  ];  peripheral edema[  ];  or itching[  ]; Musculosketetal: myalgias[  ];  joint swelling[  ];  joint erythema[  ];  joint pain[  ];  back pain[  ];  Heme/Lymph: bruising[  ];  bleeding[  ];  anemia[  ];  Neuro: TIA[  ];  headaches[  ];  stroke[  ];  vertigo[  ];  seizures[  ];   paresthesias[  ];  difficulty walking[  ];  Psych:depression[  ]; anxiety[  ];  Endocrine: diabetes[  ];  thyroid dysfunction[  ];  Other:  Past Medical History  Diagnosis Date  . COPD (chronic obstructive pulmonary disease) (HCC)     self diagnosed  . Osteoarthritis     s/p right total hip  . Atrial fibrillation  (HCC)     anticoagulated with Eliquis   . Back pain   . Shoulder pain, bilateral   . Leukocytosis   . Thrombocytopenia (HCC)   . ARF (acute renal failure) (HCC)   . Stroke [facility])     Medications Prior to Admission  Medication Sig Dispense Refill  . apixaban (ELIQUIS) 2.5 MG TABS tablet Take 1 tablet (2.5 mg total) by mouth 2 (two) times daily. 180 tablet 3  . atorvastatin (LIPITOR) 40 MG tablet TAKE ONE (1) TABLET EACH DAY 30 tablet 10  . metoprolol (LOPRESSOR) 25 MG tablet Take 1 tablet (25 mg total) by mouth 2 (two) times daily. 180 tablet 3  . Misc Natural Products (OSTEO BI-FLEX ADV JOINT SHIELD PO) Take 2 tablets by mouth daily.       Marland Kitchen apixaban  2.5 mg Oral BID  . atorvastatin  40 mg Oral q1800  . sodium chloride flush  3 mL Intravenous Q12H    Infusions: . esmolol 125 mcg/kg/min (11/11/15 0154)    No Known Allergies  Social History   Social History  . Marital Status: Married    Spouse Name: [name]  . Number of Children: [name]  . Years of Education: [name]   Occupational History  . Not on file.   Social History Main Topics  . Smoking status: Former Games developer  . Smokeless tobacco: Former Neurosurgeon    Quit date: 08/23/1976  . Alcohol Use: No  . Drug Use: No  . Sexual Activity: Not on file   Other Topics Concern  . Not on file   Social History Narrative    Family History  Problem Relation Age of Onset  . Other      No known heart disease  . Tuberculosis Father     PHYSICAL EXAM: Filed Vitals:   11/11/15 0055 11/11/15 0110  BP: 105/73 98/74  Pulse: 124 109  Temp:    Resp: 26 20    No intake or output data in the 24 hours ending 11/11/15 0211  General:  Elderly, well appearing. No respiratory difficulty HEENT: normal Neck: supple. no JVD. Carotids 2+  bilat; no bruits. No lymphadenopathy or thryomegaly appreciated. Cor: PMI nondisplaced. Irregularly irregular rate and rhythm. No rubs, gallops or murmurs. Lungs: clear Abdomen: soft, nontender, nondistended.  No hepatosplenomegaly. No bruits or masses. Good bowel sounds. Extremities: no cyanosis, clubbing, rash, edema Neuro: alert & oriented x 3, cranial nerves grossly intact. moves all 4 extremities w/o difficulty. Affect pleasant.  ECG:  SR with frequent PAC and nonspecific STT changes.  Results for orders placed or performed during the hospital encounter of 11/10/15 (from the past 24 hour(s))  Basic metabolic panel     Status: Abnormal   Collection Time: 11/10/15  5:55 PM  Result Value Ref Range   Sodium 140 135 - 145 mmol/L   Potassium 4.0 3.5 - 5.1 mmol/L   Chloride 107 101 - 111 mmol/L   CO2 22 22 - 32 mmol/L   Glucose, Bld 156 (H) 65 - 99 mg/dL   BUN 23 (H) 6 - 20 mg/dL   Creatinine, Ser 8.11 (H) 0.61 - 1.24 mg/dL   Calcium 9.0 8.9 - 91.4 mg/dL   GFR calc non Af Amer 35 (L) >60 mL/min   GFR calc Af Amer 40 (L) >60 mL/min   Anion gap 11 5 - 15  CBC with Differential/Platelet     Status: Abnormal   Collection Time: 11/10/15  5:55 PM  Result Value Ref Range   WBC 19.0 (H) 4.0 - 10.5 K/uL   RBC 4.86 4.22 - 5.81 MIL/uL   Hemoglobin 16.0 13.0 - 17.0 g/dL   HCT 78.2 95.6 - 21.3 %   MCV 98.1 78.0 - 100.0 fL   MCH 32.9 26.0 - 34.0 pg   MCHC 33.5 30.0 - 36.0 g/dL   RDW 08.6 57.8 - 46.9 %   Platelets 151 150 - 400 K/uL   Neutrophils Relative % 74 %   Neutro Abs 14.1 (H) 1.7 - 7.7 K/uL   Lymphocytes Relative 18 %   Lymphs Abs 3.3 0.7 - 4.0 K/uL   Monocytes Relative 7 %   Monocytes Absolute 1.4 (H) 0.1 - 1.0 K/uL   Eosinophils Relative 1 %   Eosinophils Absolute 0.1 0.0 - 0.7 K/uL   Basophils Relative 0 %   Basophils Absolute 0.0 0.0 - 0.1 K/uL  Troponin I     Status: None   Collection Time: 11/10/15  5:55 PM  Result Value Ref Range   Troponin I <0.03 <0.031 ng/mL  Troponin I (q 6hr x 3)     Status: Abnormal   Collection Time: 11/11/15  1:11 AM  Result Value Ref Range   Troponin I 0.04 (H) <0.031 ng/mL   Dg Chest Portable 1 View  11/10/2015  CLINICAL DATA:  Central chest  pain radiating into the back since yesterday. History of COPD and atrial fibrillation. EXAM: PORTABLE CHEST 1 VIEW COMPARISON:  07/01/2014 radiographs.  CT 05/15/2012. FINDINGS: 1805 hours. The heart size and mediastinal contours are stable with chronic aortic ectasia. There is chronic elevation of the left hemidiaphragm with associated left basilar atelectasis or scarring. There is chronic central airway thickening, but no edema, confluent airspace opacity or pleural effusion. No acute osseous findings are evident. There are degenerative changes within the spine. There is evidence of chronic rotator cuff tears bilaterally. IMPRESSION: Stable chronic central airway thickening and left basilar atelectasis/ scarring. No acute cardiopulmonary process. Electronically Signed   By: Carey Bullocks M.D.   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  PLAN/DISCUSSION: Rate control, given concerns about pauses, can use esmolol which is short acting Will need increase in his home metoprolol for better rate control May need input from EP given current symptomatic pauses (short lived) but concurrent need to increase AVN blocking agents as he may benefit from dual chamber pacemaker Continue anticoagulation with eliquis

## 2015-11-11 NOTE — Progress Notes (Signed)
Pt back in afib rvr rates 120-144. NP notified. Pt is asymptomatic. Leave pt on Bedrest

## 2015-11-11 NOTE — Progress Notes (Signed)
Asked to assist floor RN with new Esmolol gtt. Pt found resting quietly in bed, denies chest pain. Initial BP 101/82 HR 120-140 Afib labile HR, Po2 94& on RA. Dr. Beacher MayGardener at bedside aware of Patient VS, request NO BOLUS with gtt initiation. Esmolol gtt started at 25 mcg/kg and titrated per pharmacy guidelines. (See MAR for specifics) As of 0100 BP 105/73 HR remains labile however seen as low as 90-130s. RN to monitor closely, will titrate gtt as needed for goal HR below 110 per Dr. Beacher MayGardener and BP no less than 100 consistently. Also per MD to STOP gtt immediately for pauses.  RRT to follow Patient tonight.

## 2015-11-11 NOTE — Progress Notes (Signed)
Paged [facility] to admit the client and let them know that the client was back in A-fib changing from NSR in route. HR was in the 130's to 150's, BP 138/77, RR 25, O2 95 on RA. The client was complaining of dizziness on the ride over and was very unsteady when we walked over to the bed.  When Julian ReilGardner arrived the client had a pause 3.75 sec and I had him look at the strip and we checked the client together. He stated he felt dizzy and lightheaded a few minutes before we came in. At that time Julian ReilGardner consulted Onalee Hualvarez and they spoke on the phone to set up a care plan for the client.    I will continue to monitor the client closely.  Elita Booneina Ayers RN

## 2015-11-12 ENCOUNTER — Other Ambulatory Visit (HOSPITAL_COMMUNITY): Payer: Medicare Other

## 2015-11-12 ENCOUNTER — Encounter (HOSPITAL_COMMUNITY)
Admission: EM | Disposition: A | Discharge status: Home / Self Care | Payer: Self-pay | Source: Home / Self Care | Attending: Internal Medicine

## 2015-11-12 HISTORY — PX: EP IMPLANTABLE DEVICE: SHX172B

## 2015-11-12 SURGERY — PACEMAKER IMPLANT

## 2015-11-12 MED ORDER — LIDOCAINE HCL (PF) 1 % IJ SOLN
INTRAMUSCULAR | Status: AC
  Filled 2015-11-12: qty 60

## 2015-11-12 MED ORDER — SODIUM CHLORIDE 0.9 % IV SOLN: INTRAVENOUS | Status: AC

## 2015-11-12 MED ORDER — CEFAZOLIN SODIUM 1-5 GM-% IV SOLN
1.0000 g | Freq: Four times a day (QID) | INTRAVENOUS | Status: AC
  Administered 2015-11-12 – 2015-11-13 (×3): 1 g via INTRAVENOUS
  Filled 2015-11-12 (×3): qty 50

## 2015-11-12 MED ORDER — CEFAZOLIN SODIUM-DEXTROSE 2-3 GM-% IV SOLR
INTRAVENOUS | Status: AC
  Filled 2015-11-12: qty 50

## 2015-11-12 MED ORDER — SODIUM CHLORIDE 0.9 % IV SOLN
INTRAVENOUS | Status: DC
  Administered 2015-11-12: 08:00:00 via INTRAVENOUS

## 2015-11-12 MED ORDER — SODIUM CHLORIDE 0.9 % IR SOLN
Status: AC
  Filled 2015-11-12: qty 2

## 2015-11-12 MED ORDER — METOPROLOL TARTRATE 50 MG PO TABS
50.0000 mg | ORAL_TABLET | Freq: Two times a day (BID) | ORAL | Status: DC
  Administered 2015-11-12 – 2015-11-13 (×3): 50 mg via ORAL
  Filled 2015-11-12 (×3): qty 1

## 2015-11-12 MED ORDER — ONDANSETRON HCL 4 MG/2ML IJ SOLN: 4.0000 mg | Freq: Four times a day (QID) | INTRAMUSCULAR | Status: DC | PRN

## 2015-11-12 MED ORDER — ACETAMINOPHEN 325 MG PO TABS: 325.0000 mg | ORAL_TABLET | ORAL | Status: DC | PRN

## 2015-11-12 MED ORDER — HEPARIN (PORCINE) IN NACL 2-0.9 UNIT/ML-% IJ SOLN
INTRAMUSCULAR | Status: DC | PRN
  Administered 2015-11-12: 10:00:00

## 2015-11-12 MED ORDER — LIDOCAINE HCL (PF) 1 % IJ SOLN
INTRAMUSCULAR | Status: DC | PRN
  Administered 2015-11-12: 31 mL via INTRADERMAL

## 2015-11-12 SURGICAL SUPPLY — 8 items
CABLE SURGICAL S-101-97-12 (CABLE) ×2 IMPLANT
HEMOSTAT SURGICEL 2X4 FIBR (HEMOSTASIS) ×2 IMPLANT
LEAD TENDRIL MRI 52CM LPA1200M (Lead) ×2 IMPLANT
LEAD TENDRIL MRI 58CM LPA1200M (Lead) ×2 IMPLANT
PACEMAKER ASSURITY DR-RF (Pacemaker) ×2 IMPLANT
PAD DEFIB LIFELINK (PAD) ×2 IMPLANT
SHEATH CLASSIC 8F (SHEATH) ×4 IMPLANT
TRAY PACEMAKER INSERTION (PACKS) ×2 IMPLANT

## 2015-11-12 NOTE — Progress Notes (Signed)
[facility]                                                                              Patient Demographics  Justin Baker, is a 82 y.o. male, DOB - 10-22-1921, ZOX:[id number]  Admit date - 11/10/2015   Admitting Physician Therisa Doyne, MD  Outpatient Primary MD for the patient is No PCP Per Patient  LOS - 2  days    Chief Complaint  Patient presents with  . Chest Pain       Brief HPI   Justin Baker is a 82 y.o. male who presents to the ED at [facility] with c/o CP starting around 2-3PM. He took ASA. Initially BP up to 180s, ER gave NTG which improved BP and chest pain went down to 2-3. Morphine caused resolution of chest pain and patient having no chest pain at [facility] on arrival. Patient has a h/o A.Fib but has been in NSR for the past year he states. Initially today he was A.Fib in the ED, has been converting in and out of this on arrival to [facility] but has been persistently in A.Fib RVR for the most part since arrival. He has been having symptomatic dizziness episodes for the past couple of months and had 2 of these episodes since admission.  These episodes correlated exactly with sinus pauses of 3.75 and 3.88 seconds on the monitor.   Assessment & Plan    Principal Problem:   Chest pain- currently resolved, possibly due to atrial fibrillation with RVR - Cardiology consulted, troponin slightly +0.042 - Cardiology following, 2-D echocardiogram pending - Continue Lipitor, metoprolol, on eliquis  Active Problems:   Atrial fibrillation with rapid ventricular response (HCC) with sinus pauses - EP cardiology also consulted, pacemaker placement today - Continue medical regulation with eliquis  COPD - Currently stable, no wheezing  Chronic back pain - Currently stable  Hyperlipidemia - Continue Lipitor  Code Status: full code  Family Communication: Discussed in detail with the patient, all imaging results, lab results explained to the patient    Disposition Plan: Likely in a.m., pacemaker placement today  Time Spent in minutes  15 minutes  Procedures  None  Consults   Cardiology, EP   DVT Prophylaxis  apixaban  Medications  Scheduled Meds: . atorvastatin  40 mg Oral q1800  .  ceFAZolin (ANCEF) IV  1 g Intravenous Q6H  . metoprolol tartrate  50 mg Oral BID  . sodium chloride flush  3 mL Intravenous Q12H   Continuous Infusions: . sodium chloride 50 mL/hr at 11/12/15 0801  . sodium chloride    . esmolol Stopped (11/11/15 0515)   PRN Meds:.   Antibiotics   Anti-infectives    Start     Dose/Rate Route Frequency Ordered Stop   11/12/15 1530  ceFAZolin (ANCEF) IVPB 1 g/50 mL premix     1 g 100 mL/hr over 30 Minutes Intravenous Every 6 hours 11/12/15 1056 11/13/15 0929   11/11/15 1600  gentamicin (GARAMYCIN) 80 mg in sodium chloride irrigation 0.9 % 500 mL irrigation     80 mg Irrigation To Cath Lab 11/11/15 1321 11/12/15 1013   11/11/15  1330  ceFAZolin (ANCEF) IVPB 2 g/50 mL premix     2 g 100 mL/hr over 30 Minutes Intravenous To Cath Lab 11/11/15 1321 11/12/15 1009        Subjective:   Justin Baker was seen and examined today. Denies any chest pain, shortness of breath, awaiting pacemaker placement today. No acute events overnight.  Patient denies abdominal pain, N/V/D/C, new weakness, numbess, tingling. No acute events overnight.    Objective:   Filed Vitals:   11/12/15 1025 11/12/15 1028 11/12/15 1033 11/12/15 1125  BP:    138/89  Pulse:  0 0 116  Temp:      TempSrc:      Resp:  0 0   Height:      Weight:      SpO2: 94% 95% 0%     Intake/Output Summary (Last 24 hours) at 11/12/15 1156 Last data filed at 11/12/15 0827  Gross per 24 hour  Intake    360 ml  Output   1551 ml  Net  -1191 ml     Wt Readings from Last 3 Encounters:  11/12/15 88.225 kg (194 lb 8 oz)  09/02/15 90.774 kg (200 lb 1.9 oz)  06/16/15 91.173 kg (201 lb)     Exam  General: Alert and oriented x 3, NAD  HEENT:   .   Neck: Supple, no JVD  CVS: S1 S2 clear, irregularly irregular, no murmurs  Respiratory: CTAB  Abdomen: Soft, nontender, nondistended, + bowel sounds  Ext: no cyanosis clubbing or edema  Neuro: no new deficits  Skin: No rashes  Psych: Normal affect and demeanor, alert and oriented x3    Data Reviewed:  I have personally reviewed following labs and imaging studies  Micro Results Recent Results (from the past 240 hour(s))  MRSA PCR Screening     Status: None   Collection Time: 11/10/15 10:26 PM  Result Value Ref Range Status   MRSA by PCR NEGATIVE NEGATIVE Final    Comment:        The GeneXpert MRSA Assay (FDA approved for NASAL specimens only), is one component of a comprehensive MRSA colonization surveillance program. It is not intended to diagnose MRSA infection nor to guide or monitor treatment for MRSA infections.     Radiology Reports Dg Chest Portable 1 View  11/10/2015  CLINICAL DATA:  Central chest pain radiating into the back since yesterday. History of COPD and atrial fibrillation. EXAM: PORTABLE CHEST 1 VIEW COMPARISON:  07/01/2014 radiographs.  CT 05/15/2012. FINDINGS: 1805 hours. The heart size and mediastinal contours are stable with chronic aortic ectasia. There is chronic elevation of the left hemidiaphragm with associated left basilar atelectasis or scarring. There is chronic central airway thickening, but no edema, confluent airspace opacity or pleural effusion. No acute osseous findings are evident. There are degenerative changes within the spine. There is evidence of chronic rotator cuff tears bilaterally. IMPRESSION: Stable chronic central airway thickening and left basilar atelectasis/ scarring. No acute cardiopulmonary process. Electronically Signed   By: Carey Bullocks M.D.   On: 11/10/2015 18:25    CBC  Recent Labs Lab 11/10/15 1755  WBC 19.0*  HGB 16.0  HCT 47.7  PLT 151  MCV 98.1  MCH 32.9  MCHC 33.5  RDW 13.2  LYMPHSABS 3.3   MONOABS 1.4*  EOSABS 0.1  BASOSABS 0.0    Chemistries   Recent Labs Lab 11/10/15 1755  NA 140  K 4.0  CL 107  CO2 22  GLUCOSE 156*  BUN 23*  CREATININE 1.65*  CALCIUM 9.0   ------------------------------------------------------------------------------------------------------------------ estimated creatinine clearance is 32 mL/min (by C-G formula based on Cr of 1.65). ------------------------------------------------------------------------------------------------------------------ No results for input(s): HGBA1C in the last 72 hours. ------------------------------------------------------------------------------------------------------------------ No results for input(s): CHOL, HDL, LDLCALC, TRIG, CHOLHDL, LDLDIRECT in the last 72 hours. ------------------------------------------------------------------------------------------------------------------ No results for input(s): TSH, T4TOTAL, T3FREE, THYROIDAB in the last 72 hours.  Invalid input(s): FREET3 ------------------------------------------------------------------------------------------------------------------ No results for input(s): VITAMINB12, FOLATE, FERRITIN, TIBC, IRON, RETICCTPCT in the last 72 hours.  Coagulation profile No results for input(s): INR, PROTIME in the last 168 hours.  No results for input(s): DDIMER in the last 72 hours.  Cardiac Enzymes  Recent Labs Lab 11/11/15 0111 11/11/15 0638 11/11/15 1258  TROPONINI 0.04* 0.04* 0.08*   ------------------------------------------------------------------------------------------------------------------ Invalid input(s): POCBNP  No results for input(s): GLUCAP in the last 72 hours.   RAI,RIPUDEEP M.D. [facility] 11/12/2015, 11:56 AM  Pager: [phone] Between 7am to 7pm - call Pager - [phone]  After 7pm go to [email or web address] - password [email or web address]  Call night coverage person covering after 7pm

## 2015-11-12 NOTE — H&P (View-Only) (Signed)
Patient Name: Justin Baker      SUBJECTIVE: without problems overnight  Understands plans and procedure from discussions yesterday   Past Medical History  Diagnosis Date  . COPD (chronic obstructive pulmonary disease) (HCC)     self diagnosed  . Osteoarthritis     s/p right total hip  . Atrial fibrillation (HCC)     anticoagulated with Eliquis   . Back pain   . Shoulder pain, bilateral   . Leukocytosis   . Thrombocytopenia (HCC)   . ARF (acute renal failure) (HCC)   . Stroke [facility]HCC)     Scheduled Meds:  Scheduled Meds: . atorvastatin  40 mg Oral q1800  .  ceFAZolin (ANCEF) IV  2 g Intravenous To Cath  . chlorhexidine  60 mL Topical Once  . gentamicin irrigation  80 mg Irrigation To Cath  . sodium chloride flush  3 mL Intravenous Q12H   Continuous Infusions: . sodium chloride 50 mL/hr at 11/12/15 0549  . esmolol Stopped (11/11/15 0515)       PHYSICAL EXAM Filed Vitals:   11/11/15 1935 11/12/15 0000 11/12/15 0400 11/12/15 0612  BP:  116/73 112/77   Pulse:  92 93   Temp: 98.2 F (36.8 C) 98.3 F (36.8 C) 98 F (36.7 C)   TempSrc: Oral Oral Oral   Resp:  22 14   Height:      Weight:    194 lb 8 oz (88.225 kg)  SpO2:  92% 93%     Well developed and nourished in no acute distress HENT normal Neck supple with JVP-flat Carotids brisk and full without bruits Clear Irregularly irregular rate and rhythm withrapid ventricular response, no murmurs or gallops Abd-soft with active BS without hepatomegaly No Clubbing cyanosis edema Skin-warm and dry A & Oriented  Grossly normal sensory and motor function   TELEMETRY: Reviewed telemetry pt in  AF RVR with pauses    Intake/Output Summary (Last 24 hours) at 11/12/15 0757 Last data filed at 11/12/15 0600  Gross per 24 hour  Intake    480 ml  Output   1750 ml  Net  -1270 ml    LABS: Basic Metabolic Panel:  Recent Labs Lab 11/10/15 1755  NA 140  K 4.0  CL 107  CO2 22  GLUCOSE 156*  BUN  23*  CREATININE 1.65*  CALCIUM 9.0   Cardiac Enzymes:  Recent Labs  11/11/15 0111 11/11/15 0638 11/11/15 1258  TROPONINI 0.04* 0.04* 0.08*   CBC:  Recent Labs Lab 11/10/15 1755  WBC 19.0*  NEUTROABS 14.1*  HGB 16.0  HCT 47.7  MCV 98.1  PLT 151   PROTIME: No results for input(s): LABPROT, INR in the last 72 hours. Liver Function Tests: No results for input(s): AST, ALT, ALKPHOS, BILITOT, PROT, ALBUMIN in the last 72 hours. No results for input(s): LIPASE, AMYLASE in the last 72 hours. BNP: BNP (last 3 results) No results for input(s): BNP in the last 8760 hours.  ProBNP (last 3 results) No results for input(s): PROBNP in the last 8760 hours.   ASSESSMENT AND PLAN:  Principal Problem:   Chest pain Active Problems:   Atrial fibrillation with rapid ventricular response (HCC)   Pain in the chest   Sick sinus syndrome (HCC)  For pacemaker this am for tachy brady syndrome The benefits and risks were reviewed including but not limited to death,  perforation, infection, lead dislodgement and device malfunction.  The patient understands agrees and is willing  to proceed.   Signed, Sherryl Manges MD  11/12/2015  3

## 2015-11-12 NOTE — Progress Notes (Addendum)
Patient Name: Justin Baker      SUBJECTIVE: without problems overnight  Understands plans and procedure from discussions yesterday   Past Medical History  Diagnosis Date  . COPD (chronic obstructive pulmonary disease) (HCC)     self diagnosed  . Osteoarthritis     s/p right total hip  . Atrial fibrillation (HCC)     anticoagulated with Eliquis   . Back pain   . Shoulder pain, bilateral   . Leukocytosis   . Thrombocytopenia (HCC)   . ARF (acute renal failure) (HCC)   . Stroke [facility]HCC)     Scheduled Meds:  Scheduled Meds: . atorvastatin  40 mg Oral q1800  .  ceFAZolin (ANCEF) IV  2 g Intravenous To Cath  . chlorhexidine  60 mL Topical Once  . gentamicin irrigation  80 mg Irrigation To Cath  . sodium chloride flush  3 mL Intravenous Q12H   Continuous Infusions: . sodium chloride 50 mL/hr at 11/12/15 0549  . esmolol Stopped (11/11/15 0515)       PHYSICAL EXAM Filed Vitals:   11/11/15 1935 11/12/15 0000 11/12/15 0400 11/12/15 0612  BP:  116/73 112/77   Pulse:  92 93   Temp: 98.2 F (36.8 C) 98.3 F (36.8 C) 98 F (36.7 C)   TempSrc: Oral Oral Oral   Resp:  22 14   Height:      Weight:    194 lb 8 oz (88.225 kg)  SpO2:  92% 93%     Well developed and nourished in no acute distress HENT normal Neck supple with JVP-flat Carotids brisk and full without bruits Clear Irregularly irregular rate and rhythm withrapid ventricular response, no murmurs or gallops Abd-soft with active BS without hepatomegaly No Clubbing cyanosis edema Skin-warm and dry A & Oriented  Grossly normal sensory and motor function   TELEMETRY: Reviewed telemetry pt in  AF RVR with pauses    Intake/Output Summary (Last 24 hours) at 11/12/15 0757 Last data filed at 11/12/15 0600  Gross per 24 hour  Intake    480 ml  Output   1750 ml  Net  -1270 ml    LABS: Basic Metabolic Panel:  Recent Labs Lab 11/10/15 1755  NA 140  K 4.0  CL 107  CO2 22  GLUCOSE 156*  BUN  23*  CREATININE 1.65*  CALCIUM 9.0   Cardiac Enzymes:  Recent Labs  11/11/15 0111 11/11/15 0638 11/11/15 1258  TROPONINI 0.04* 0.04* 0.08*   CBC:  Recent Labs Lab 11/10/15 1755  WBC 19.0*  NEUTROABS 14.1*  HGB 16.0  HCT 47.7  MCV 98.1  PLT 151   PROTIME: No results for input(s): LABPROT, INR in the last 72 hours. Liver Function Tests: No results for input(s): AST, ALT, ALKPHOS, BILITOT, PROT, ALBUMIN in the last 72 hours. No results for input(s): LIPASE, AMYLASE in the last 72 hours. BNP: BNP (last 3 results) No results for input(s): BNP in the last 8760 hours.  ProBNP (last 3 results) No results for input(s): PROBNP in the last 8760 hours.   ASSESSMENT AND PLAN:  Principal Problem:   Chest pain Active Problems:   Atrial fibrillation with rapid ventricular response (HCC)   Pain in the chest   Sick sinus syndrome (HCC)  For pacemaker this am for tachy brady syndrome The benefits and risks were reviewed including but not limited to death,  perforation, infection, lead dislodgement and device malfunction.  The patient understands agrees and is willing  to proceed.   Signed, Sherryl MangesSteven Judythe Postema MD  11/12/2015  Will resume rate control meds following device as still fast

## 2015-11-12 NOTE — Interval H&P Note (Signed)
History and Physical Interval Note:  11/12/2015 7:58 AM  Justin Baker  has presented today for surgery, with the diagnosis of tachibradi  The various methods of treatment have been discussed with the patient and family. After consideration of risks, benefits and other options for treatment, the patient has consented to  Procedure(s): Pacemaker Implant ([name]) as a surgical intervention .  The patient's history has been reviewed, patient examined, no change in status, stable for surgery.  I have reviewed the patient's chart and labs.  Questions were answered to the patient's satisfaction.     Sherryl MangesSteven Sartaj Hoskin

## 2015-11-13 ENCOUNTER — Inpatient Hospital Stay (HOSPITAL_COMMUNITY): Payer: Medicare Other

## 2015-11-13 ENCOUNTER — Encounter (HOSPITAL_COMMUNITY): Payer: Self-pay | Admitting: Internal Medicine

## 2015-11-13 DIAGNOSIS — R079 Chest pain, unspecified: Secondary | ICD-10-CM

## 2015-11-13 DIAGNOSIS — I209 Angina pectoris, unspecified: Secondary | ICD-10-CM

## 2015-11-13 LAB — CBC
HEMATOCRIT: 42.2 % (ref 39.0–52.0)
HEMOGLOBIN: 13.7 g/dL (ref 13.0–17.0)
MCH: 31.7 pg (ref 26.0–34.0)
MCHC: 32.5 g/dL (ref 30.0–36.0)
MCV: 97.7 fL (ref 78.0–100.0)
Platelets: 147 10*3/uL — ABNORMAL LOW (ref 150–400)
RBC: 4.32 MIL/uL (ref 4.22–5.81)
RDW: 13 % (ref 11.5–15.5)
WBC: 12 10*3/uL — ABNORMAL HIGH (ref 4.0–10.5)

## 2015-11-13 LAB — BASIC METABOLIC PANEL
ANION GAP: 8 (ref 5–15)
BUN: 22 mg/dL — ABNORMAL HIGH (ref 6–20)
CHLORIDE: 110 mmol/L (ref 101–111)
CO2: 25 mmol/L (ref 22–32)
Calcium: 8.5 mg/dL — ABNORMAL LOW (ref 8.9–10.3)
Creatinine, Ser: 1.62 mg/dL — ABNORMAL HIGH (ref 0.61–1.24)
GFR calc Af Amer: 41 mL/min — ABNORMAL LOW (ref >60)
GFR calc non Af Amer: 35 mL/min — ABNORMAL LOW (ref >60)
GLUCOSE: 112 mg/dL — AB (ref 65–99)
POTASSIUM: 3.7 mmol/L (ref 3.5–5.1)
Sodium: 143 mmol/L (ref 135–145)

## 2015-11-13 LAB — ECHOCARDIOGRAM COMPLETE
HEIGHTINCHES: 69 in
Weight: 3116.8 oz

## 2015-11-13 MED ORDER — APIXABAN 5 MG PO TABS
5.0000 mg | ORAL_TABLET | Freq: Two times a day (BID) | ORAL | Status: DC
  Administered 2015-11-13: 5 mg via ORAL
  Filled 2015-11-13: qty 1

## 2015-11-13 MED ORDER — METOPROLOL TARTRATE 75 MG PO TABS: 75.0000 mg | ORAL_TABLET | Freq: Two times a day (BID) | ORAL | Status: DC

## 2015-11-13 MED ORDER — METOPROLOL TARTRATE 50 MG PO TABS: 75.0000 mg | ORAL_TABLET | Freq: Two times a day (BID) | ORAL | Status: DC

## 2015-11-13 MED ORDER — APIXABAN 2.5 MG PO TABS: 2.5000 mg | ORAL_TABLET | Freq: Two times a day (BID) | ORAL | Status: DC

## 2015-11-13 MED FILL — Sodium Chloride Irrigation Soln 0.9%: Qty: 500 | Status: AC

## 2015-11-13 MED FILL — Cefazolin Sodium-Dextrose IV Solution 2 GM/100ML-4%: INTRAVENOUS | Qty: 100 | Status: AC

## 2015-11-13 MED FILL — Gentamicin Sulfate Inj 40 MG/ML: INTRAMUSCULAR | Qty: 2 | Status: AC

## 2015-11-13 NOTE — Progress Notes (Signed)
  Echocardiogram 2D Echocardiogram has been performed.  Justin Baker, Justin Baker 11/13/2015, 8:23 AM

## 2015-11-13 NOTE — Discharge Summary (Signed)
Physician Discharge Summary   Patient ID: Justin Baker MRN: [id number] DOB/AGE: October 24, 1921 82 y.o.  Admit date: 11/10/2015 Discharge date: 11/13/2015  Primary Care Physician:  No PCP Per Patient  Discharge Diagnoses:    . Chest pain . Atrial fibrillation with rapid ventricular response (HCC)And bradycardia, sinus pauses status post pacemaker placement Sick sinus syndrome  COPD   Chronic back pain   Hyperlipidemia   Consults: Cardiology  Recommendations for Outpatient Follow-up:  1. Metoprolol increased to 75 mg twice a day 2. Please repeat CBC/BMET at next visit   DIET: Heart healthy diet    Allergies:  No Known Allergies   DISCHARGE MEDICATIONS: Current Discharge Medication List    CONTINUE these medications which have CHANGED   Details  Metoprolol Tartrate 75 MG TABS Take 75 mg by mouth 2 (two) times daily. Qty: 60 tablet, Refills: 3      CONTINUE these medications which have NOT CHANGED   Details  apixaban (ELIQUIS) 2.5 MG TABS tablet Take 1 tablet (2.5 mg total) by mouth 2 (two) times daily. Qty: 180 tablet, Refills: 3    atorvastatin (LIPITOR) 40 MG tablet TAKE ONE (1) TABLET EACH DAY Qty: 30 tablet, Refills: 10    Misc Natural Products (OSTEO BI-FLEX ADV JOINT SHIELD PO) Take 2 tablets by mouth daily.         Brief H and P: For complete details please refer to admission H and P, but in briefJames Frutoso Baker is a 82 y.o. male who [facility] with c/o CP starting around 2-3PM. He took ASA. Initially BP up to 180s, ER gave NTG which improved BP and chest pain went down to 2-3. Morphine caused resolution of chest pain and patient having no chest pain at [facility] on arrival. Patient has a h/o A.Fib but has been in NSR for the past year he states. Initially today he was A.Fib in the ED, has been converting in and out of this on [facility] but has been persistently in A.Fib RVR for the most part since arrival. He has been having symptomatic  dizziness episodes for the past couple of months and had 2 of these episodes since admission. These episodes correlated exactly with sinus pauses of 3.75 and 3.88 seconds on the monitor.  Hospital Course:  Chest pain- currently resolved, possibly due to atrial fibrillation with RVR - Cardiology was consulted, troponin slightly positive due to atrial fibrillation with RVR - Continue Lipitor, metoprolol, on eliquis - 2-D echo showed EF 60-65%, improved from prior echo   Atrial fibrillation with rapid ventricular response (HCC) with sinus pauses - EP cardiology was consulted, patient underwent pacemaker placement on 11/12/15 - Patient is cleared from EP standpoint for discharge, recommended to increase metoprolol to 75 mg twice a day and resume eliquis. The patient will be seen in 1-2 weeks in office for further rate control/atrial fibrillation management.  COPD - Currently stable, no wheezing  Chronic back pain - Currently stable  Hyperlipidemia - Continue Lipitor  Day of Discharge BP 115/97 mmHg  Pulse 78  Temp(Src) 98.2 F (36.8 C) (Oral)  Resp 20  Ht 5\' 9"  (1.753 m)  Wt 88.361 kg (194 lb 12.8 oz)  BMI 28.75 kg/m2  SpO2 100%  Physical Exam: General: Alert and awake oriented x3 not in any acute distress. HEENT: anicteric sclera, pupils reactive to light and accommodation CVS: S1-S2 clear no murmur rubs or gallops Chest: clear to auscultation bilaterally, no wheezing rales or rhonchi Abdomen: soft nontender,  nondistended, normal bowel sounds Extremities: no cyanosis, clubbing or edema noted bilaterally Neuro: Cranial nerves II-XII intact, no focal neurological deficits   The results of significant diagnostics from this hospitalization (including imaging, microbiology, ancillary and laboratory) are listed below for reference.    LAB RESULTS: Basic Metabolic Panel:  Recent Labs Lab 11/10/15 1755 11/13/15 0420  NA 140 143  K 4.0 3.7  CL 107 110  CO2 22 25  GLUCOSE  156* 112*  BUN 23* 22*  CREATININE 1.65* 1.62*  CALCIUM 9.0 8.5*   Liver Function Tests: No results for input(s): AST, ALT, ALKPHOS, BILITOT, PROT, ALBUMIN in the last 168 hours. No results for input(s): LIPASE, AMYLASE in the last 168 hours. No results for input(s): AMMONIA in the last 168 hours. CBC:  Recent Labs Lab 11/10/15 1755 11/13/15 0420  WBC 19.0* 12.0*  NEUTROABS 14.1*  --   HGB 16.0 13.7  HCT 47.7 42.2  MCV 98.1 97.7  PLT 151 147*   Cardiac Enzymes:  Recent Labs Lab 11/11/15 0638 11/11/15 1258  TROPONINI 0.04* 0.08*   BNP: Invalid input(s): POCBNP CBG: No results for input(s): GLUCAP in the last 168 hours.  Significant Diagnostic Studies:  Dg Chest Portable 1 View  11/10/2015  CLINICAL DATA:  Central chest pain radiating into the back since yesterday. History of COPD and atrial fibrillation. EXAM: PORTABLE CHEST 1 VIEW COMPARISON:  07/01/2014 radiographs.  CT 05/15/2012. FINDINGS: 1805 hours. The heart size and mediastinal contours are stable with chronic aortic ectasia. There is chronic elevation of the left hemidiaphragm with associated left basilar atelectasis or scarring. There is chronic central airway thickening, but no edema, confluent airspace opacity or pleural effusion. No acute osseous findings are evident. There are degenerative changes within the spine. There is evidence of chronic rotator cuff tears bilaterally. IMPRESSION: Stable chronic central airway thickening and left basilar atelectasis/ scarring. No acute cardiopulmonary process. Electronically Signed   By: Carey Bullocks M.D.   On: 11/10/2015 18:25    2D ECHO:  Study Conclusions  - Procedure narrative: Transthoracic echocardiography. Image  quality was poor. The study was technically difficult, as a  result of restricted patient mobility. - Left ventricle: The cavity size was normal. Wall thickness was  increased in a pattern of mild LVH. Systolic function was normal.  The  estimated ejection fraction was in the range of 60% to 65%.  Images were inadequate for LV wall motion assessment. The study  is not technically sufficient to allow evaluation of LV diastolic  function. - Left atrium: The atrium was normal in size. - Tricuspid valve: There was trivial regurgitation. - Pulmonary arteries: PA peak pressure: 25 mm Hg (S) + RAP. - Systemic veins: Not visualized.  Impressions:  - Compared to the prior echo in 2013, EF has improved to 60-65%.  Disposition and Follow-up: Discharge Instructions    Diet - low sodium heart healthy    Complete by:  As directed      Increase activity slowly    Complete by:  As directed             DISPOSITION: Home  DISCHARGE FOLLOW-UP Follow-up Information    Follow up with [facility] On 11/20/2015.   Specialty:  Cardiology   Why:  at [facility] for wound check    Contact information:   [address], [address] [place] [address] [phone]      Follow up with Marily Lente, NP On 11/20/2015.   Specialty:  Cardiology   Why:  1:20PM   Contact information:   [address] [place] [address] [phone]       Follow up with Hillis Range, MD On 02/11/2016.   Specialty:  Cardiology   Why:  10:00AM   Contact information:   [address] [place] [address] [phone]        Time spent on Discharge: 25 minutes  Signed:   RAI,RIPUDEEP M.D. [facility] 11/13/2015, 11:56 AM Pager: [phone]

## 2015-11-13 NOTE — Discharge Instructions (Signed)
° ° °  Supplemental Discharge Instructions for  Pacemaker/Defibrillator Patients  Activity No heavy lifting or vigorous activity with your left/right arm for 6 to 8 weeks.  Do not raise your left/right arm above your head for one week.  Gradually raise your affected arm as drawn below.              11/16/15                    11/17/15                    11/18/15                   11/19/15 __  NO DRIVING for  1 week   ; you may begin driving on  6/96/293/29/17   .  WOUND CARE - Keep the wound area clean and dry.  Do not get this area wet for one week. No showers for one week; you may shower on  11/19/15   . - The tape/steri-strips on your wound will fall off; do not pull them off.  No bandage is needed on the site.  DO  NOT apply any creams, oils, or ointments to the wound area. - If you notice any drainage or discharge from the wound, any swelling or bruising at the site, or you develop a fever > 101? F after you are discharged home, call the office at once.  Special Instructions - You are still able to use cellular telephones; use the ear opposite the side where you have your pacemaker/defibrillator.  Avoid carrying your cellular phone near your device. - When traveling through airports, show security personnel your identification card to avoid being screened in the metal detectors.  Ask the security personnel to use the hand wand. - Avoid arc welding equipment, MRI testing (magnetic resonance imaging), TENS units (transcutaneous nerve stimulators).  Call the office for questions about other devices. - Avoid electrical appliances that are in poor condition or are not properly grounded. - Microwave ovens are safe to be near or to operate.  Additional information for defibrillator patients should your device go off: - If your device goes off ONCE and you feel fine afterward, notify the device clinic nurses. - If your device goes off ONCE and you do not feel well afterward, call 911. - If your device  goes off TWICE, call 911. - If your device goes off THREE times in one day, call 911.  DO NOT DRIVE YOURSELF OR A FAMILY MEMBER WITH A DEFIBRILLATOR TO THE HOSPITAL--CALL 911.

## 2015-11-13 NOTE — Care Management Important Message (Signed)
Important Message  Patient Details  Name: Glean HessJames Kivi MRN: [id number] Date of Birth: [birth year]   Medicare Important Message Given:  Yes    Oralia RudMegan P Kyna Blahnik 11/13/2015, 1:33 PM

## 2015-11-13 NOTE — Progress Notes (Signed)
SUBJECTIVE: The patient is doing well today.  At this time, he denies chest pain, shortness of breath, or any new concerns.  Asymptomatic with afib.  Justin Baker. apixaban  5 mg Oral BID  . atorvastatin  40 mg Oral q1800  . metoprolol tartrate  75 mg Oral BID  . sodium chloride flush  3 mL Intravenous Q12H   . sodium chloride 50 mL/hr at 11/12/15 0801    OBJECTIVE: Physical Exam: Filed Vitals:   11/13/15 0037 11/13/15 0500 11/13/15 0800 11/13/15 0900  BP:  123/73 125/84 115/101  Pulse:      Temp: 98.7 F (37.1 C) 98.2 F (36.8 C)    TempSrc: Oral Oral    Resp:  19  13  Height:      Weight:  194 lb 12.8 oz (88.361 kg)    SpO2: 96% 100% 100%     Intake/Output Summary (Last 24 hours) at 11/13/15 1000 Last data filed at 11/13/15 0800  Gross per 24 hour  Intake   1050 ml  Output    850 ml  Net    200 ml    Telemetry reveals frequent afib, V rates mostly < 100 bpm  GEN- The patient is well appearing, alert and oriented x 3 today.   Head- normocephalic, atraumatic Eyes-  Sclera clear, conjunctiva pink Ears- hearing intact Oropharynx- clear Neck- supple,  Lungs- Clear to ausculation bilaterally, normal work of breathing Heart- irregular rate and rhythm  GI- soft, NT, ND, + BS Extremities- no clubbing, cyanosis, or edema Skin- no rash or lesion, no hematoma over pacemaker site Psych- euthymic mood, full affect Neuro- strength and sensation are intact  LABS: Basic Metabolic Panel:  Recent Labs  13/24/4003/20/17 1755 11/13/15 0420  NA 140 143  K 4.0 3.7  CL 107 110  CO2 22 25  GLUCOSE 156* 112*  BUN 23* 22*  CREATININE 1.65* 1.62*  CALCIUM 9.0 8.5*   Liver Function Tests: No results for input(s): AST, ALT, ALKPHOS, BILITOT, PROT, ALBUMIN in the last 72 hours. No results for input(s): LIPASE, AMYLASE in the last 72 hours. CBC:  Recent Labs  11/10/15 1755 11/13/15 0420  WBC 19.0* 12.0*  NEUTROABS 14.1*  --   HGB 16.0 13.7  HCT 47.7 42.2  MCV 98.1 97.7  PLT 151  147*   Cardiac Enzymes:  Recent Labs  11/11/15 0111 11/11/15 0638 11/11/15 1258  TROPONINI 0.04* 0.04* 0.08*   RADIOLOGY: Dg Chest 2 View  11/13/2015  CLINICAL DATA:  Pacemaker. EXAM: CHEST  2 VIEW COMPARISON:  11/10/2015.  07/01/2014. FINDINGS: Cardiac pacer with lead tips in right atrium right ventricle. Heart size normal. Pulmonary vascularity normal. Low lung volumes with mild bibasilar atelectasis. Diffuse mild interstitial prominence again noted consistent with chronic interstitial lung disease. No interim change. No pleural effusion or pneumothorax. IMPRESSION: 1. Cardiac pacer in stable position. 2. Stable bilateral interstitial prominence consistent chronic interstitial lung disease. Mild basilar atelectasis . Electronically Signed   By: Maisie Fushomas  Register   On: 11/13/2015 07:33   Dg Chest Portable 1 View  11/10/2015  CLINICAL DATA:  Central chest pain radiating into the back since yesterday. History of COPD and atrial fibrillation. EXAM: PORTABLE CHEST 1 VIEW COMPARISON:  07/01/2014 radiographs.  CT 05/15/2012. FINDINGS: 1805 hours. The heart size and mediastinal contours are stable with chronic aortic ectasia. There is chronic elevation of the left hemidiaphragm with associated left basilar atelectasis or scarring. There is chronic central airway thickening, but no edema, confluent airspace opacity or  pleural effusion. No acute osseous findings are evident. There are degenerative changes within the spine. There is evidence of chronic rotator cuff tears bilaterally. IMPRESSION: Stable chronic central airway thickening and left basilar atelectasis/ scarring. No acute cardiopulmonary process. Electronically Signed   By: Carey Bullocks M.D.   On: 11/10/2015 18:25    ASSESSMENT AND PLAN:  Principal Problem:   Chest pain Active Problems:   Atrial fibrillation with rapid ventricular response (HCC)   Pain in the chest   Sick sinus syndrome (HCC)  1. Tachy/brady syndrome Normal device  function on interrogation (Reviewed by me today) Stable leads and no ptx by CXR Routine wound care and follow-up  2. Paroxysmal atrial fibrillation Increase metoprolol to  BID Resume eliquis this evening Will arrange follow-up with EP NP in 1-2 weeks for further rate control/ afib management Would consider amiodarone initiation at that time  [facility] to discharge from EP standpoint. Electrophysiology team to see as needed while here. Please call with questions.   Hillis Range, MD 11/13/2015 10:00 AM

## 2015-11-13 NOTE — Progress Notes (Signed)
Discharge teaching and instructions reviewed. Pt has no further questions. prescriptions given. VSS. Pt discharging home via sons.

## 2015-11-20 ENCOUNTER — Encounter: Payer: Self-pay | Admitting: Internal Medicine

## 2015-11-20 ENCOUNTER — Ambulatory Visit (INDEPENDENT_AMBULATORY_CARE_PROVIDER_SITE_OTHER): Payer: Medicare Other | Admitting: Nurse Practitioner

## 2015-11-20 ENCOUNTER — Encounter: Payer: Self-pay | Admitting: Nurse Practitioner

## 2015-11-20 ENCOUNTER — Ambulatory Visit: Payer: Medicare Other

## 2015-11-20 VITALS — BP 160/84 | HR 64 | Ht 69.0 in | Wt 200.0 lb

## 2015-11-20 DIAGNOSIS — I480 Paroxysmal atrial fibrillation: Secondary | ICD-10-CM | POA: Diagnosis not present

## 2015-11-20 DIAGNOSIS — I495 Sick sinus syndrome: Secondary | ICD-10-CM

## 2015-11-20 LAB — HEPATIC FUNCTION PANEL
ALBUMIN: 3.7 g/dL (ref 3.6–5.1)
ALT: 12 U/L (ref 9–46)
AST: 18 U/L (ref 10–35)
Alkaline Phosphatase: 77 U/L (ref 40–115)
BILIRUBIN DIRECT: 0.2 mg/dL (ref <=0.2)
BILIRUBIN TOTAL: 0.7 mg/dL (ref 0.2–1.2)
Indirect Bilirubin: 0.5 mg/dL (ref 0.2–1.2)
Total Protein: 5.9 g/dL — ABNORMAL LOW (ref 6.1–8.1)

## 2015-11-20 LAB — TSH: TSH: 1.37 mIU/L (ref 0.40–4.50)

## 2015-11-20 MED ORDER — AMIODARONE HCL 200 MG PO TABS: 200.0000 mg | ORAL_TABLET | Freq: Every day | ORAL | Status: DC

## 2015-11-20 NOTE — Patient Instructions (Signed)
Medication Instructions:   START TAKING AMIODARONE 200 MG ONCE  A DAY   If you need a refill on your cardiac medications before your next appointment, please call your pharmacy.  Labwork:  TSH AND LFT TODAY    Testing/Procedures: NONE ORDER TODAY   Follow-Up:  AS SCHEDULED WITH DR Johney FrameALLRED    Any Other Special Instructions Will Be Listed Below (If Applicable).

## 2015-11-20 NOTE — Progress Notes (Signed)
Electrophysiology Office Note Date: 11/20/2015  ID:  Justin Baker, DOB 01-15-1922, MRN [id number]  PCP: No PCP Per Patient Primary Cardiologist: Eden Emms Electrophysiologist: Allred  CC: Post hospital follow up  Justin Baker is a 82 y.o. male seen today for Dr Johney Frame.  He presents today for post hospital electrophysiology followup.  He was admitted earlier this month with chest pressure but also intermittent dizziness and pre-syncope for the preceding weeks.  He was found to be in AF with RVR and placed on Esmolol with subsequent 4 second pauses and dizziness similar to symptoms at home. He was seen by Dr Johney Frame and PPM implantation was recommended. He underwent PPM implant and presents today for post hospital follow up. Since discharge, the patient reports doing very well.  He denies chest pain, palpitations, dyspnea, PND, orthopnea, nausea, vomiting, dizziness, syncope, edema, weight gain, or early satiety.  Device History: STJ dual chamber PPM implanted 2017 for tachy/brady syndrome   Past Medical History  Diagnosis Date  . COPD (chronic obstructive pulmonary disease) (HCC)     self diagnosed  . Osteoarthritis     s/p right total hip  . Atrial fibrillation (HCC)     anticoagulated with Eliquis   . Back pain   . Shoulder pain, bilateral   . Leukocytosis   . Thrombocytopenia (HCC)   . ARF (acute renal failure) (HCC)   . Stroke [facility])    Past Surgical History  Procedure Laterality Date  . Total hip arthroplasty      right  . Cataract extraction w/ intraocular lens implant      right  . Tee without cardioversion  05/17/2012    Procedure: TRANSESOPHAGEAL ECHOCARDIOGRAM (TEE);  Surgeon: Dolores Patty, MD;  Location: [facility] ENDOSCOPY;  Service: Cardiovascular;  Laterality: [name];  . Cardioversion  05/17/2012    Procedure: CARDIOVERSION;  Surgeon: Dolores Patty, MD;  Location: [facility] ENDOSCOPY;  Service: Cardiovascular;  Laterality: [name];  . Ep implantable device [name] 11/12/2015   Procedure: Pacemaker Implant;  Surgeon: Duke Salvia, MD;  Location: [facility] INVASIVE CV LAB;  Service: Cardiovascular;  Laterality: [name];    Current Outpatient Prescriptions  Medication Sig Dispense Refill  . apixaban (ELIQUIS) 2.5 MG TABS tablet Take 1 tablet (2.5 mg total) by mouth 2 (two) times daily. 180 tablet 3  . atorvastatin (LIPITOR) 40 MG tablet TAKE ONE (1) TABLET EACH DAY 30 tablet 10  . metoprolol (LOPRESSOR) 50 MG tablet 50 mg 2 (two) times daily.  2  . metoprolol tartrate (LOPRESSOR) 25 MG tablet Take 25 mg by mouth 2 (two) times daily.    . Misc Natural Products (OSTEO BI-FLEX ADV JOINT SHIELD PO) Take 2 tablets by mouth daily.     No current facility-administered medications for this visit.    Allergies:   Review of patient's allergies indicates no known allergies.   Social History: Social History   Social History  . Marital Status: Married    Spouse Name: [name]  . Number of Children: [name]  . Years of Education: [name]   Occupational History  . Not on file.   Social History Main Topics  . Smoking status: Former Games developer  . Smokeless tobacco: Former Neurosurgeon    Quit date: 08/23/1976  . Alcohol Use: No  . Drug Use: No  . Sexual Activity: Not on file   Other Topics Concern  . Not on file   Social History Narrative    Family History: Family History  Problem Relation Age of Onset  .  Other      No known heart disease  . Tuberculosis Father      Review of Systems: All other systems reviewed and are otherwise negative except as noted above.   Physical Exam: VS:  BP 160/84 mmHg  Pulse 64  Ht 5\' 9"  (1.753 m)  Wt 200 lb (90.719 kg)  BMI 29.52 kg/m2 , BMI Body mass index is 29.52 kg/(m^2).  GEN- The patient is elderly appearing, alert and oriented x 3 today.   HEENT: normocephalic, atraumatic; sclera clear, conjunctiva pink; hearing intact; oropharynx clear; neck supple  Lungs- Clear to ausculation bilaterally, normal work of breathing.  No wheezes, rales,  rhonchi Heart- Regular rate and rhythm  GI- soft, non-tender, non-distended, bowel sounds present  Extremities- no clubbing, cyanosis, or edema  MS- no significant deformity or atrophy Skin- warm and dry, no rash or lesion; PPM pocket well healed Psych- euthymic mood, full affect Neuro- strength and sensation are intact  PPM Interrogation- reviewed in detail today,  See PACEART report  EKG:  EKG is not ordered today.  Recent Labs: 11/13/2015: BUN 22*; Creatinine, Ser 1.62*; Hemoglobin 13.7; Platelets 147*; Potassium 3.7; Sodium 143   Wt Readings from Last 3 Encounters:  11/20/15 200 lb (90.719 kg)  11/13/15 194 lb 12.8 oz (88.361 kg)  09/02/15 200 lb 1.9 oz (90.774 kg)     Other studies Reviewed: Additional studies/ records that were reviewed today include: hospital records   Assessment and Plan:  1.  Tachy/brady syndrome Normal PPM function Wound well healed See Pace Art report No changes today  2.  Paroxysmal atrial fibrillation  Burden 31%3 by device interrogation today Continue Eliquis for CHADS2VASC of 4 (decreased dose 2/2 age and creat) V rates relatively well controlled Will start Amiodarone 200mg  daily for rhythm control  Advised could take extra metoprolol if needed for AF with RVR at home.    Current medicines are reviewed at length with the patient today.   The patient does not have concerns regarding his medicines.  The following changes were made today:  Start amiodarone 200mg  daily  Labs/ tests ordered today include: TSH, LFT's   Disposition:   Follow up with Dr Johney FrameAllred as scheduled    Signed, Gypsy BalsamAmber Seiler, NP 11/20/2015 1:47 PM  [facility] [address] [place] [address] [phone] (office) [phone] (fax)

## 2015-11-24 ENCOUNTER — Telehealth: Payer: Self-pay | Admitting: *Deleted

## 2015-11-24 NOTE — Telephone Encounter (Signed)
-----   Message from Marily LenteAmber K Seiler, NP sent at 11/21/2015  6:24 AM EDT ----- Please notify patient of normal labs

## 2016-02-11 ENCOUNTER — Encounter: Payer: Self-pay | Admitting: Internal Medicine

## 2016-02-11 ENCOUNTER — Ambulatory Visit (INDEPENDENT_AMBULATORY_CARE_PROVIDER_SITE_OTHER): Payer: Medicare Other | Admitting: Internal Medicine

## 2016-02-11 VITALS — BP 132/68 | HR 60 | Ht 68.0 in | Wt 201.4 lb

## 2016-02-11 DIAGNOSIS — I4891 Unspecified atrial fibrillation: Secondary | ICD-10-CM

## 2016-02-11 DIAGNOSIS — I495 Sick sinus syndrome: Secondary | ICD-10-CM

## 2016-02-11 LAB — CBC WITH DIFFERENTIAL/PLATELET
BASOS ABS: 0 {cells}/uL (ref 0–200)
BASOS PCT: 0 %
EOS ABS: 218 {cells}/uL (ref 15–500)
Eosinophils Relative: 2 %
HEMATOCRIT: 44.4 % (ref 38.5–50.0)
Hemoglobin: 14.9 g/dL (ref 13.2–17.1)
Lymphocytes Relative: 28 %
Lymphs Abs: 3052 cells/uL (ref 850–3900)
MCH: 32.2 pg (ref 27.0–33.0)
MCHC: 33.6 g/dL (ref 32.0–36.0)
MCV: 95.9 fL (ref 80.0–100.0)
MONO ABS: 872 {cells}/uL (ref 200–950)
MONOS PCT: 8 %
MPV: 9.5 fL (ref 7.5–12.5)
NEUTROS ABS: 6758 {cells}/uL (ref 1500–7800)
Neutrophils Relative %: 62 %
PLATELETS: 155 10*3/uL (ref 140–400)
RBC: 4.63 MIL/uL (ref 4.20–5.80)
RDW: 13.8 % (ref 11.0–15.0)
WBC: 10.9 10*3/uL — ABNORMAL HIGH (ref 3.8–10.8)

## 2016-02-11 LAB — CUP PACEART INCLINIC DEVICE CHECK
Battery Remaining Longevity: 108 mo
Brady Statistic RA Percent Paced: 93 %
Brady Statistic RV Percent Paced: 19 %
Date Time Interrogation Session: 20170621110754
Implantable Lead Implant Date: 20170322
Implantable Lead Location: 753860
Lead Channel Impedance Value: 460 Ohm
Lead Channel Pacing Threshold Pulse Width: 0.5 ms
Lead Channel Pacing Threshold Pulse Width: 0.5 ms
Lead Channel Setting Pacing Amplitude: 2 V
Lead Channel Setting Pacing Amplitude: 2.5 V
Lead Channel Setting Sensing Sensitivity: 2 mV
MDC IDC LEAD IMPLANT DT: 20170322
MDC IDC LEAD LOCATION: 753859
MDC IDC MSMT BATTERY VOLTAGE: 2.98 V
MDC IDC MSMT LEADCHNL RA PACING THRESHOLD AMPLITUDE: 0.75 V
MDC IDC MSMT LEADCHNL RV IMPEDANCE VALUE: 630 Ohm
MDC IDC MSMT LEADCHNL RV PACING THRESHOLD AMPLITUDE: 0.5 V
MDC IDC MSMT LEADCHNL RV SENSING INTR AMPL: 12 mV
MDC IDC PG SERIAL: 3134370
MDC IDC SET LEADCHNL RV PACING PULSEWIDTH: 0.4 ms
Pulse Gen Model: 2272

## 2016-02-11 LAB — BASIC METABOLIC PANEL
BUN: 29 mg/dL — ABNORMAL HIGH (ref 7–25)
CO2: 23 mmol/L (ref 20–31)
Calcium: 8.9 mg/dL (ref 8.6–10.3)
Chloride: 110 mmol/L (ref 98–110)
Creat: 1.96 mg/dL — ABNORMAL HIGH (ref 0.70–1.11)
Glucose, Bld: 91 mg/dL (ref 65–99)
POTASSIUM: 4.1 mmol/L (ref 3.5–5.3)
Sodium: 143 mmol/L (ref 135–146)

## 2016-02-11 LAB — TSH: TSH: 4.92 mIU/L — ABNORMAL HIGH (ref 0.40–4.50)

## 2016-02-11 LAB — HEPATIC FUNCTION PANEL
ALK PHOS: 64 U/L (ref 40–115)
ALT: 13 U/L (ref 9–46)
AST: 18 U/L (ref 10–35)
Albumin: 3.8 g/dL (ref 3.6–5.1)
BILIRUBIN DIRECT: 0.2 mg/dL (ref <=0.2)
BILIRUBIN INDIRECT: 0.6 mg/dL (ref 0.2–1.2)
TOTAL PROTEIN: 6.2 g/dL (ref 6.1–8.1)
Total Bilirubin: 0.8 mg/dL (ref 0.2–1.2)

## 2016-02-11 NOTE — Progress Notes (Signed)
    Justin Baker is a 82 y.o. male who presents today for routine electrophysiology followup.  Since last being seen in our clinic, the patient reports doing very well.  He has some fatigue.  Today, he denies symptoms of palpitations, chest pain, shortness of breath,  lower extremity edema, dizziness, presyncope, or syncope.  The patient is otherwise without complaint today.   Past Medical History  Diagnosis Date  . COPD (chronic obstructive pulmonary disease) (HCC)     self diagnosed  . Osteoarthritis     s/p right total hip  . Atrial fibrillation (HCC)     anticoagulated with Eliquis   . Back pain   . Shoulder pain, bilateral   . Leukocytosis   . Thrombocytopenia (HCC)   . ARF (acute renal failure) (HCC)   . Stroke [facility](HCC)    Past Surgical History  Procedure Laterality Date  . Total hip arthroplasty      right  . Cataract extraction w/ intraocular lens implant      right  . Tee without cardioversion  05/17/2012    Procedure: TRANSESOPHAGEAL ECHOCARDIOGRAM (TEE);  Surgeon: Dolores Pattyaniel R Bensimhon, MD;  [facility];  Service: Cardiovascular;  Laterality: [name];  . Cardioversion  05/17/2012    Procedure: CARDIOVERSION;  Surgeon: Dolores Pattyaniel R Bensimhon, MD;  [facility];  Service: Cardiovascular;  Laterality: [name];  . Ep implantable device [name] 11/12/2015    SJM Assurity MRI DR PPM implanted by Dr Graciela HusbandsKlein for tachy/brady syndrome    ROS- all systems are reviewed and negative except as per HPI above  Current Outpatient Prescriptions  Medication Sig Dispense Refill  . amiodarone (PACERONE) 200 MG tablet Take 1 tablet (200 mg total) by mouth daily. 30 tablet 6  . apixaban (ELIQUIS) 2.5 MG TABS tablet Take 1 tablet (2.5 mg total) by mouth 2 (two) times daily. 180 tablet 3  . atorvastatin (LIPITOR) 40 MG tablet Take 40 mg by mouth daily.    . metoprolol (LOPRESSOR) 50 MG tablet Take 75 mg by mouth 2 (two) times daily.   2  . Misc Natural Products (OSTEO BI-FLEX ADV JOINT SHIELD  PO) Take 2 tablets by mouth daily.     No current facility-administered medications for this visit.    Physical Exam: Filed Vitals:   02/11/16 1007  BP: 132/68  Pulse: 60  Height: 5\' 8"  (1.727 m)  Weight: 201 lb 6.4 oz (91.354 kg)    GEN- The patient is well appearing, alert and oriented x 3 today.   Head- normocephalic, atraumatic Eyes-  Sclera clear, conjunctiva pink Ears- hearing intact Oropharynx- clear Lungs- Clear to ausculation bilaterally, normal work of breathing Chest- pacemaker pocket is well healed Heart- Regular rate and rhythm, no murmurs, rubs or gallops, PMI not laterally displaced GI- soft, NT, ND, + BS Extremities- no clubbing, cyanosis, or edema  Pacemaker interrogation- reviewed in detail today,  See PACEART report  ekg today reveals atrial pacing 60 bpm nonspecific ST/T changes  Assessment and Plan:  1. Bradycardia/ tachycardia Normal pacemaker function See Pace Art report Rate response turned on today and patient ambulated in the hall  2. Atrial fibrillation Improved Continue amiodarone and eliquis Tfts/lfts, cbc and bmet today  3. Fatigue Rate response turned on Reduce metoprolol to 50mg  BID  Return to see EP NP In 3 months merlin  Hillis RangeJames Dixie Coppa MD, [facility] 02/11/2016 10:58 AM

## 2016-02-11 NOTE — Patient Instructions (Addendum)
Medication Instructions:  Your physician has recommended you make the following change in your medication:  1) DECREASE Metoprolol to 50 mg (1 tablet) twice daily   Labwork: Your physician recommends that you return for lab work TODAY: Liver profile, TSH, CBC, BMET    Testing/Procedures: None ordered   Follow-Up: Your physician wants you to follow-up in: 3 months with Justin BalsamAmber Seiler, NP  Remote monitoring is used to monitor your Pacemaker  from home. This monitoring reduces the number of office visits required to check your device to one time per year. It allows us to keep an eye on the functioning of your device to ensure it is working properly. You are scheduled for a device check from home on 05/12/16. You may send your transmission at any time that day. If you have a wireless device, the transmission will be sent automatically. After your physician reviews your transmission, you will receive a postcard with your next transmission date.     Any Other Special Instructions Will Be Listed Below (If Applicable).     If you need a refill on your cardiac medications before your next appointment, please call your pharmacy.

## 2016-04-09 ENCOUNTER — Encounter: Payer: Self-pay | Admitting: Nurse Practitioner

## 2016-04-27 ENCOUNTER — Other Ambulatory Visit: Payer: Self-pay | Admitting: *Deleted

## 2016-04-27 MED ORDER — METOPROLOL TARTRATE 50 MG PO TABS: 50.0000 mg | ORAL_TABLET | Freq: Two times a day (BID) | ORAL | 2 refills | Status: DC

## 2016-05-17 NOTE — Progress Notes (Signed)
Electrophysiology Office Note Date: 05/18/2016  ID:  Peter Keyworth, DOB [birth year], MRN [id number]  PCP: No PCP Per Patient Primary Cardiologist: Eden Emms Electrophysiologist: Allred  CC: Pacemaker and AF follow up  Bevan Disney is a 82 y.o. male seen today for Dr Johney Frame.  He presents today for routine EP follow up.  Since last being seen in clinic, the patient reports doing reasonably well.  He continues with decreased energy despite rate response being turned on. He has not had recurrent AF. He is tolerating Eliquis without bleeding complications.  He denies chest pain, palpitations, dyspnea, PND, orthopnea, nausea, vomiting, dizziness, syncope, edema, weight gain, or early satiety.  Device History: STJ dual chamber PPM implanted 2017 for tachy/brady syndrome   Past Medical History:  Diagnosis Date  . ARF (acute renal failure) (HCC)   . Atrial fibrillation (HCC)    anticoagulated with Eliquis   . Back pain   . COPD (chronic obstructive pulmonary disease) (HCC)    self diagnosed  . Leukocytosis   . Osteoarthritis    s/p right total hip  . Shoulder pain, bilateral   . Stroke (HCC)   . Thrombocytopenia (HCC)    Past Surgical History:  Procedure Laterality Date  . CARDIOVERSION  05/17/2012   Procedure: CARDIOVERSION;  Surgeon: Dolores Patty, MD;  Location: [facility] ENDOSCOPY;  Service: Cardiovascular;  Laterality: [name];  . CATARACT EXTRACTION W/ INTRAOCULAR LENS IMPLANT     right  . EP IMPLANTABLE DEVICE [name] 11/12/2015   SJM Assurity MRI DR PPM implanted by Dr Graciela Husbands for tachy/brady syndrome  . TEE WITHOUT CARDIOVERSION  05/17/2012   Procedure: TRANSESOPHAGEAL ECHOCARDIOGRAM (TEE);  Surgeon: Dolores Patty, MD;  Location: [facility] ENDOSCOPY;  Service: Cardiovascular;  Laterality: [name];  . TOTAL HIP ARTHROPLASTY     right    Current Outpatient Prescriptions  Medication Sig Dispense Refill  . amiodarone (PACERONE) 200 MG tablet Take 1 tablet (200 mg total) by mouth daily. 90 tablet  3  . apixaban (ELIQUIS) 2.5 MG TABS tablet Take 1 tablet (2.5 mg total) by mouth 2 (two) times daily. 180 tablet 3  . atorvastatin (LIPITOR) 40 MG tablet Take 1 tablet (40 mg total) by mouth daily. 90 tablet 3  . metoprolol (LOPRESSOR) 50 MG tablet Take 1 tablet (50 mg total) by mouth 2 (two) times daily. 180 tablet 3  . Misc Natural Products (OSTEO BI-FLEX ADV JOINT SHIELD PO) Take 2 tablets by mouth daily.     No current facility-administered medications for this visit.     Allergies:   Review of patient's allergies indicates no known allergies.   Social History: Social History   Social History  . Marital status: Married    Spouse name: [name]  . Number of children: [name]  . Years of education: [name]   Occupational History  . Not on file.   Social History Main Topics  . Smoking status: Former Games developer  . Smokeless tobacco: Former Neurosurgeon    Quit date: 08/23/1976  . Alcohol use No  . Drug use: No  . Sexual activity: Not on file   Other Topics Concern  . Not on file   Social History Narrative  . No narrative on file    Family History: Family History  Problem Relation Age of Onset  . Other      No known heart disease  . Tuberculosis Father      Review of Systems: All other systems reviewed and are otherwise negative except as noted above.  Physical Exam: VS:  BP 128/70   Pulse 65   Ht 5\' 8"  (1.727 m)   Wt 200 lb 3.2 oz (90.8 kg)   SpO2 94%   BMI 30.44 kg/m  , BMI Body mass index is 30.44 kg/m.  GEN- The patient is elderly appearing, alert and oriented x 3 today.   HEENT: normocephalic, atraumatic; sclera clear, conjunctiva pink; hearing intact; oropharynx clear; neck supple  Lungs- Clear to ausculation bilaterally, normal work of breathing.  No wheezes, rales, rhonchi Heart- Regular rate and rhythm  GI- soft, non-tender, non-distended, bowel sounds present  Extremities- no clubbing, cyanosis, or edema  MS- no significant deformity or atrophy Skin- warm and dry,  no rash or lesion; PPM pocket well healed Psych- euthymic mood, full affect Neuro- strength and sensation are intact  PPM Interrogation- reviewed in detail today,  See PACEART report  EKG:  EKG is not ordered today.  Recent Labs: 02/11/2016: ALT 13; BUN 29; Creat 1.96; Hemoglobin 14.9; Platelets 155; Potassium 4.1; Sodium 143; TSH 4.92   Wt Readings from Last 3 Encounters:  05/18/16 200 lb 3.2 oz (90.8 kg)  02/11/16 201 lb 6.4 oz (91.4 kg)  11/20/15 200 lb (90.7 kg)     Other studies Reviewed: Additional studies/ records that were reviewed today include: hospital records   Assessment and Plan:  1.  Tachy/brady syndrome Normal PPM function See Pace Art report No changes today  2.  Paroxysmal atrial fibrillation  Burden 0% by device interrogation today Continue Eliquis for CHADS2VASC of 4 (decreased dose 2/2 age and creat) V rates relatively well controlled Continue amiodarone - recent LFT's stable. TSH, FT4 today    Current medicines are reviewed at length with the patient today.   The patient does not have concerns regarding his medicines.  The following changes were made today:  none  Labs/ tests ordered today include: TSH, FT4 today   Disposition:   Follow up with Merlin transmissions, me in 6 months   Signed, Gypsy BalsamAmber Damiano Stamper, NP 05/18/2016 12:09 PM  [facility] [address] [place] [address] [phone] (office) [phone] (fax)

## 2016-05-18 ENCOUNTER — Encounter: Payer: Self-pay | Admitting: Nurse Practitioner

## 2016-05-18 ENCOUNTER — Ambulatory Visit (INDEPENDENT_AMBULATORY_CARE_PROVIDER_SITE_OTHER): Payer: Medicare Other | Admitting: Nurse Practitioner

## 2016-05-18 ENCOUNTER — Encounter (INDEPENDENT_AMBULATORY_CARE_PROVIDER_SITE_OTHER): Payer: Self-pay

## 2016-05-18 ENCOUNTER — Encounter: Payer: Self-pay | Admitting: Internal Medicine

## 2016-05-18 VITALS — BP 128/70 | HR 65 | Ht 68.0 in | Wt 200.2 lb

## 2016-05-18 DIAGNOSIS — I495 Sick sinus syndrome: Secondary | ICD-10-CM

## 2016-05-18 DIAGNOSIS — I480 Paroxysmal atrial fibrillation: Secondary | ICD-10-CM | POA: Diagnosis not present

## 2016-05-18 DIAGNOSIS — Z79899 Other long term (current) drug therapy: Secondary | ICD-10-CM

## 2016-05-18 LAB — CUP PACEART INCLINIC DEVICE CHECK
Date Time Interrogation Session: 20170926115209
Implantable Lead Implant Date: 20170322
Implantable Lead Location: 753859
MDC IDC LEAD IMPLANT DT: 20170322
MDC IDC LEAD LOCATION: 753860
MDC IDC PG SERIAL: 3134370

## 2016-05-18 LAB — TSH: TSH: 5.89 mIU/L — ABNORMAL HIGH (ref 0.40–4.50)

## 2016-05-18 LAB — T4, FREE: FREE T4: 1.3 ng/dL (ref 0.8–1.8)

## 2016-05-18 MED ORDER — AMIODARONE HCL 200 MG PO TABS: 200.0000 mg | ORAL_TABLET | Freq: Every day | ORAL | 3 refills | Status: DC

## 2016-05-18 MED ORDER — APIXABAN 2.5 MG PO TABS: 2.5000 mg | ORAL_TABLET | Freq: Two times a day (BID) | ORAL | 3 refills | Status: DC

## 2016-05-18 MED ORDER — ATORVASTATIN CALCIUM 40 MG PO TABS: 40.0000 mg | ORAL_TABLET | Freq: Every day | ORAL | 3 refills | Status: DC

## 2016-05-18 MED ORDER — METOPROLOL TARTRATE 50 MG PO TABS: 50.0000 mg | ORAL_TABLET | Freq: Two times a day (BID) | ORAL | 3 refills | Status: DC

## 2016-05-18 NOTE — Patient Instructions (Addendum)
Medication Instructions:   Your physician recommends that you continue on your current medications as directed. Please refer to the Current Medication list given to you today.    If you need a refill on your cardiac medications before your next appointment, please call your pharmacy.  Labwork: TSH AND FREE T4    Testing/Procedures: NONE ORDER TODAY    Follow-Up: Your physician wants you to follow-up in:  IN  6  MONTHS WITH SEILER   You will receive a reminder letter in the mail two months in advance. If you don't receive a letter, please call our office to schedule the follow-up appointment.  ' Remote monitoring is used to monitor your Pacemaker of ICD from home. This monitoring reduces the number of office visits required to check your device to one time per year. It allows us to keep an eye on the functioning of your device to ensure it is working properly. You are scheduled for a device check from home on . 08/17/16..You may send your transmission at any time that day. If you have a wireless device, the transmission will be sent automatically. After your physician reviews your transmission, you will receive a postcard with your next transmission date.    Any Other Special Instructions Will Be Listed Below (If Applicable).

## 2016-05-19 ENCOUNTER — Telehealth: Payer: Self-pay | Admitting: *Deleted

## 2016-05-19 ENCOUNTER — Encounter: Payer: Medicare Other | Admitting: Nurse Practitioner

## 2016-05-19 NOTE — Telephone Encounter (Signed)
SPOKE TO PT ABOUT RESULTS AND VERBALIZED UNDERSTANDING  

## 2016-05-19 NOTE — Telephone Encounter (Signed)
-----   Message from Marily LenteAmber K Seiler, NP sent at 05/18/2016  4:19 PM EDT ----- TSH stable. FT4 normal.  No changes at this time.  Please notify patient.

## 2016-08-17 ENCOUNTER — Ambulatory Visit (INDEPENDENT_AMBULATORY_CARE_PROVIDER_SITE_OTHER): Payer: Medicare Other | Admitting: *Deleted

## 2016-08-17 ENCOUNTER — Telehealth: Payer: Self-pay | Admitting: Cardiology

## 2016-08-17 DIAGNOSIS — I495 Sick sinus syndrome: Secondary | ICD-10-CM

## 2016-08-17 NOTE — Telephone Encounter (Signed)
Spoke with pt and reminded pt of remote transmission that is due today. Pt verbalized understanding.   

## 2016-08-17 NOTE — Progress Notes (Signed)
Remote pacemaker transmission.   

## 2016-08-18 LAB — CUP PACEART REMOTE DEVICE CHECK
Brady Statistic RA Percent Paced: 99 %
Date Time Interrogation Session: 20171227084415
Implantable Lead Implant Date: 20170322
Implantable Lead Location: 753859
Implantable Lead Location: 753860
Implantable Pulse Generator Implant Date: 20170322
MDC IDC LEAD IMPLANT DT: 20170322
MDC IDC MSMT BATTERY VOLTAGE: 2.98 V
MDC IDC MSMT LEADCHNL RA IMPEDANCE VALUE: 490 Ohm
MDC IDC MSMT LEADCHNL RV IMPEDANCE VALUE: 560 Ohm
MDC IDC STAT BRADY RV PERCENT PACED: 99 %
Pulse Gen Model: 2272
Pulse Gen Serial Number: 3134370

## 2016-08-20 ENCOUNTER — Encounter: Payer: Self-pay | Admitting: Cardiology

## 2016-09-01 NOTE — Progress Notes (Signed)
Patient ID: Justin Baker, male   DOB: Aug 29, 1921, 83 y.o.   MRN: [id number]   09/02/2016 Justin Baker   Aug 29, 1921  [id number]  Primary Physician No PCP Per Patient Primary Cardiologist: [name]   Reason for Visit/CC:  Hospital follow-up for CVA in the setting of paroxysmal atrial fibrillation  HPI:  The patient is a 83 y.o.  Male, with a history of paroxysmal atrial fibrillation, first diagnosed in 2013. Due to high bleeding risk given his advanced age and high fall risk, he was not felt to be a candidate for long-term oral anticoagulation. He was previously on Eliquis but this was discontinued for the previous stated reasons. He also has a prior history of mild LV systolic dysfunction which has since improved. Echocardiogram 04/2012 revealed an ejection fraction of 40-45%. However he had a Myoview 07/11/2012 that was normal. EF at that time 65%.  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 Per discharge summary, he was not a candidate for TPA. He was noted to be in and out of atrial fibrillation during the time of his admission. Per records, an echocardiogram was obtained which revealed normal ejection fraction at 60-65%. There was mild concentric left ventricular hypertrophy. There was a structurally normal mitral valve with good mobility and mild mitral regurgitation. Aortic valve was also structurally normal with no significant stenosis or regurgitation. There was no evidence of pericardial effusion. Also no evidence of left atrial clot by TTE. It does not appear that a TEE was performed. It  was also noted that the [facility] felt that he would be high risk for anticoagulation due to risk of bleeding and risk for falls. Neurology recommended to continue aspirin plus Plavix. The patient reports that he went to inpatient rehab and continued to have issues with recurrent atrial fibrillation.  He was then seen by a cardiologist in [place] at the inpatient rehab center who increased his metoprolol to 50 mg BID also recommended that he restart Eliquis, at a dose of 5 mg BID. His ASA and Plavix were both discontinued.   Since increasing his metoprolol, he denies any recurrent palpitations. He also feels more reassured now that he is back on Eliquis. He understands his bleed risk but verbalized that he wishes to continue to reduce risk of recurrent stroke. He denies any abnormal bleeding. He also denies any falls.    He denies dizziness, syncope/ near syncope.  Seen by PA in October and beta blocker reduced for HR less than 50    Current Outpatient Prescriptions  Medication Sig Dispense Refill  . amiodarone (PACERONE) 200 MG tablet Take 1 tablet (200 mg total) by mouth daily. 90 tablet 3  . apixaban (ELIQUIS) 2.5 MG TABS tablet Take 1 tablet (2.5 mg total) by mouth 2 (two) times daily. 180 tablet 3  . atorvastatin (LIPITOR) 40 MG tablet Take 1 tablet (40 mg total) by mouth daily. 90 tablet 3  . metoprolol (LOPRESSOR) 50 MG tablet Take 1 tablet (50 mg total) by mouth 2 (two) times daily. 180 tablet 3  . Misc Natural Products (OSTEO BI-FLEX ADV JOINT SHIELD PO) Take 2 tablets by mouth daily.     No current facility-administered medications for this visit.     No Known Allergies  Social History   Social History  . Marital status: Married    Spouse name: [name]  . Number of children: [name]  . Years  of education: [name]   Occupational History  . Not on file.   Social History Main Topics  . Smoking status: Former Games developer  . Smokeless tobacco: Former Neurosurgeon    Quit date: 08/23/1976  . Alcohol use No  . Drug use: No  . Sexual activity: Not on file   Other Topics Concern  . Not on file   Social History Narrative  . No narrative on file     Review of Systems: General: negative for chills, fever, night sweats or weight changes.  Cardiovascular: negative for chest pain, dyspnea on exertion,  edema, orthopnea, palpitations, paroxysmal nocturnal dyspnea or shortness of breath Dermatological: negative for rash Respiratory: negative for cough or wheezing Urologic: negative for hematuria Abdominal: negative for nausea, vomiting, diarrhea, bright red blood per rectum, melena, or hematemesis Neurologic: negative for visual changes, syncope, or dizziness All other systems reviewed and are otherwise negative except as noted above.    Blood pressure 130/90, pulse 94, height 5\' 9"  (1.753 m), weight 206 lb 6.4 oz (93.6 kg), SpO2 98 %.  General appearance: alert, cooperative and no distress Neck: no carotid bruit and no JVD Lungs: clear to auscultation bilaterally Heart: regular rate and rhythm, S1, S2 normal, no murmur, click, rub or gallop Extremities: no LEE Pulses: 2+ and symmetric Skin: warm and dry Neurologic: Grossly normal  EKG  06/16/15   sinus bradycardia. 45 bpm. 1st degree AVB.  09/02/16 AV pacing rate 56  ASSESSMENT AND PLAN:   1. PAF: currently in SR  Tolerating elquis with no bleeding issues  2. CVA: speech is improved but not back to 100%. Continue low dose eliquis and statin   3. Pacer:  Normal function rate response adjusted but still with some fatigue f/u EP   PLAN  F/u with me in a year    [name]  09/02/2016 4:24 PM

## 2016-09-02 ENCOUNTER — Ambulatory Visit (INDEPENDENT_AMBULATORY_CARE_PROVIDER_SITE_OTHER): Payer: Medicare Other | Admitting: Cardiovascular Disease

## 2016-09-02 ENCOUNTER — Encounter: Payer: Self-pay | Admitting: Cardiovascular Disease

## 2016-09-02 VITALS — BP 130/90 | HR 94 | Ht 69.0 in | Wt 206.4 lb

## 2016-09-02 DIAGNOSIS — Z79899 Other long term (current) drug therapy: Secondary | ICD-10-CM

## 2016-09-02 DIAGNOSIS — I480 Paroxysmal atrial fibrillation: Secondary | ICD-10-CM | POA: Diagnosis not present

## 2016-09-02 NOTE — Patient Instructions (Addendum)
Medication Instructions:  Your physician recommends that you continue on your current medications as directed. Please refer to the Current Medication list given to you today.  Labwork: NONE  Testing/Procedures: Your physician has recommended that you have a pulmonary function test. Pulmonary Function Tests are a group of tests that measure how well air moves in and out of your lungs.  Follow-Up: Your physician wants you to follow-up in: 6 months with Dr. Nishan. You will receive a reminder letter in the mail two months in advance. If you don't receive a letter, please call our office to schedule the follow-up appointment.   If you need a refill on your cardiac medications before your next appointment, please call your pharmacy.    

## 2016-09-16 ENCOUNTER — Ambulatory Visit (INDEPENDENT_AMBULATORY_CARE_PROVIDER_SITE_OTHER): Payer: Medicare Other | Admitting: Internal Medicine

## 2016-09-16 DIAGNOSIS — Z79899 Other long term (current) drug therapy: Secondary | ICD-10-CM

## 2016-09-16 DIAGNOSIS — I480 Paroxysmal atrial fibrillation: Secondary | ICD-10-CM | POA: Diagnosis not present

## 2016-09-16 LAB — PULMONARY FUNCTION TEST
DL/VA % PRED: 74 %
DL/VA: 3.23 ml/min/mmHg/L
DLCO COR % PRED: 57 %
DLCO COR: 15.86 ml/min/mmHg
DLCO unc % pred: 55 %
DLCO unc: 15.29 ml/min/mmHg
FEF 25-75 POST: 2.28 L/s
FEF 25-75 PRE: 2.11 L/s
FEF2575-%Change-Post: 8 %
FEF2575-%PRED-PRE: 203 %
FEF2575-%Pred-Post: 219 %
FEV1-%Change-Post: 1 %
FEV1-%Pred-Post: 146 %
FEV1-%Pred-Pre: 143 %
FEV1-POST: 2.79 L
FEV1-Pre: 2.74 L
FEV1FVC-%Change-Post: 6 %
FEV1FVC-%PRED-PRE: 108 %
FEV6-%CHANGE-POST: -3 %
FEV6-%PRED-POST: 133 %
FEV6-%PRED-PRE: 138 %
FEV6-POST: 3.51 L
FEV6-Pre: 3.62 L
FEV6FVC-%CHANGE-POST: 0 %
FEV6FVC-%PRED-POST: 109 %
FEV6FVC-%Pred-Pre: 108 %
FVC-%Change-Post: -4 %
FVC-%PRED-POST: 121 %
FVC-%Pred-Pre: 126 %
FVC-POST: 3.51 L
FVC-Pre: 3.66 L
POST FEV6/FVC RATIO: 100 %
PRE FEV1/FVC RATIO: 75 %
Post FEV1/FVC ratio: 80 %
Pre FEV6/FVC Ratio: 99 %

## 2016-09-17 ENCOUNTER — Telehealth: Payer: Self-pay

## 2016-09-17 NOTE — Telephone Encounter (Signed)
-----   Message from Wendall StadePeter C Nishan, MD sent at 09/17/2016 11:53 AM EST ----- Diffusion capacity significantly down stop amiodarone f/u with Allred to see if there is alternative for PAF ? Multaq

## 2016-09-17 NOTE — Telephone Encounter (Signed)
Options at age 83 are limited. I am happy to see him.

## 2016-09-17 NOTE — Telephone Encounter (Signed)
Patient aware of results and has stopped amiodarone. Will send to Dr. Johney FrameAllred for further advisement.

## 2016-09-20 NOTE — Telephone Encounter (Signed)
Will forward to Dr. Jenel LucksAllred's scheduler to see about getting an appointment for patient.

## 2016-09-22 ENCOUNTER — Encounter: Payer: Self-pay | Admitting: *Deleted

## 2016-09-24 NOTE — Telephone Encounter (Signed)
Patient's son took patient's BP 132/61 and HR 70. Patient states he only feels dizzy and lightheaded when standing up. Informed patient's son that, patient's BP might be dropping when he stands up. Informed patient's son that he might have orthostatic hypotension, and at his next visit they can check for this.  Encouraged patient to change positions slowly especially when standing. Patient denies any chest pain or headache at this time. Patient stated he feels fine when sitting.  Patient has an appointment with Dr. Johney FrameAllred on Monday. Informed patient's son that patient can be evaluated on Monday at office visit and will have pacemaker checked at that time as well.  Patient's son verbalized understanding and will call with any other questions.

## 2016-09-24 NOTE — Telephone Encounter (Signed)
Called patient's son (DPR) about his message. Informed patient's son that amiodarone was stopped due to patient's SOB and PFT results. Patient's son stated that patient is breathing better and is not SOB any more. Enquired about HR and BP. Patient's son stated he does not have these readings, but he could go take his HR and BP. Patient's son asked for our office to call back in about 30 minutes to give him time to find BP machine and take BP. Will call back around 11:00.

## 2016-09-24 NOTE — Telephone Encounter (Signed)
Follow up   Pt son verbalized that pt medication was adjusted and he feels tired,   weak and headaches since stopping the medication

## 2016-09-27 ENCOUNTER — Encounter: Payer: Self-pay | Admitting: Internal Medicine

## 2016-09-27 ENCOUNTER — Ambulatory Visit (INDEPENDENT_AMBULATORY_CARE_PROVIDER_SITE_OTHER): Payer: Medicare Other | Admitting: Internal Medicine

## 2016-09-27 VITALS — BP 130/80 | HR 64 | Ht 67.0 in | Wt 201.0 lb

## 2016-09-27 DIAGNOSIS — I495 Sick sinus syndrome: Secondary | ICD-10-CM

## 2016-09-27 DIAGNOSIS — I480 Paroxysmal atrial fibrillation: Secondary | ICD-10-CM

## 2016-09-27 LAB — CUP PACEART INCLINIC DEVICE CHECK
Brady Statistic RA Percent Paced: 99.81 %
Brady Statistic RV Percent Paced: 98 %
Implantable Lead Implant Date: 20170322
Implantable Lead Location: 753859
Lead Channel Impedance Value: 575 Ohm
Lead Channel Pacing Threshold Amplitude: 0.5 V
Lead Channel Pacing Threshold Amplitude: 0.75 V
Lead Channel Pacing Threshold Pulse Width: 0.5 ms
Lead Channel Pacing Threshold Pulse Width: 0.5 ms
Lead Channel Sensing Intrinsic Amplitude: 12 mV
Lead Channel Setting Pacing Amplitude: 2 V
Lead Channel Setting Pacing Pulse Width: 0.5 ms
Lead Channel Setting Sensing Sensitivity: 2 mV
MDC IDC LEAD IMPLANT DT: 20170322
MDC IDC LEAD LOCATION: 753860
MDC IDC MSMT BATTERY VOLTAGE: 2.99 V
MDC IDC MSMT LEADCHNL RA IMPEDANCE VALUE: 462.5 Ohm
MDC IDC MSMT LEADCHNL RA PACING THRESHOLD AMPLITUDE: 0.75 V
MDC IDC MSMT LEADCHNL RA PACING THRESHOLD PULSEWIDTH: 0.5 ms
MDC IDC MSMT LEADCHNL RA SENSING INTR AMPL: 2.3 mV
MDC IDC MSMT LEADCHNL RV PACING THRESHOLD AMPLITUDE: 0.5 V
MDC IDC MSMT LEADCHNL RV PACING THRESHOLD PULSEWIDTH: 0.5 ms
MDC IDC PG IMPLANT DT: 20170322
MDC IDC SESS DTM: 20180205174959
MDC IDC SET LEADCHNL RV PACING AMPLITUDE: 2.5 V
Pulse Gen Model: 2272
Pulse Gen Serial Number: 3134370

## 2016-09-27 NOTE — Progress Notes (Signed)
    Justin Baker is a 83 y.o. male who presents today for routine electrophysiology followup.  Since last being seen in our clinic, the patient reports doing very well.  He has some fatigue.  He was recently evaluated by Dr Eden EmmsNishan and amiodarone was discontinued due to abnormal PFTs.  He remains in sinus rhythm.  Today, he denies symptoms of palpitations, chest pain, shortness of breath,  lower extremity edema, dizziness, presyncope, or syncope.  The patient is otherwise without complaint today.   Past Medical History:  Diagnosis Date  . ARF (acute renal failure) (HCC)   . Atrial fibrillation (HCC)    anticoagulated with Eliquis   . Back pain   . COPD (chronic obstructive pulmonary disease) (HCC)    self diagnosed  . Leukocytosis   . Osteoarthritis    s/p right total hip  . Shoulder pain, bilateral   . Stroke (HCC)   . Thrombocytopenia (HCC)    Past Surgical History:  Procedure Laterality Date  . CARDIOVERSION  05/17/2012   Procedure: CARDIOVERSION;  Surgeon: Dolores Pattyaniel R Bensimhon, MD;  [facility];  Service: Cardiovascular;  Laterality: [name];  . CATARACT EXTRACTION W/ INTRAOCULAR LENS IMPLANT     right  . EP IMPLANTABLE DEVICE [name] 11/12/2015   SJM Assurity MRI DR PPM implanted by Dr Graciela HusbandsKlein for tachy/brady syndrome  . TEE WITHOUT CARDIOVERSION  05/17/2012   Procedure: TRANSESOPHAGEAL ECHOCARDIOGRAM (TEE);  Surgeon: Dolores Pattyaniel R Bensimhon, MD;  [facility];  Service: Cardiovascular;  Laterality: [name];  . TOTAL HIP ARTHROPLASTY     right    ROS- all systems are reviewed and negative except as per HPI above  Current Outpatient Prescriptions  Medication Sig Dispense Refill  . apixaban (ELIQUIS) 2.5 MG TABS tablet Take 1 tablet (2.5 mg total) by mouth 2 (two) times daily. 180 tablet 3  . atorvastatin (LIPITOR) 40 MG tablet Take 1 tablet (40 mg total) by mouth daily. 90 tablet 3  . metoprolol (LOPRESSOR) 50 MG tablet Take 1 tablet (50 mg total) by mouth 2 (two) times daily.  180 tablet 3  . Misc Natural Products (OSTEO BI-FLEX ADV JOINT SHIELD PO) Take 2 tablets by mouth daily.     No current facility-administered medications for this visit.     Physical Exam: Vitals:   09/27/16 1452  BP: 130/80  Pulse: 64  Weight: 201 lb (91.2 kg)  Height: 5\' 7"  (1.702 m)    GEN- The patient is elderly appearing, alert and oriented x 3 today.   Head- normocephalic, atraumatic Eyes-  Sclera clear, conjunctiva pink Ears- hearing intact Oropharynx- clear Lungs- Clear to ausculation bilaterally, normal work of breathing Chest- pacemaker pocket is well healed Heart- Regular rate and rhythm, no murmurs, rubs or gallops, PMI not laterally displaced GI- soft, NT, ND, + BS Extremities- no clubbing, cyanosis, or edema  Pacemaker interrogation- reviewed in detail today,  See PACEART report  ekg today reveals atrial and ventricular pacing 60 bpm  With PVCs  Assessment and Plan:  1. Bradycardia/ tachycardia Normal pacemaker function See Pace Art report Rate response adjusted today due to fatigue  2. Atrial fibrillation Maintaining sinus rhythm off amiodarone Given renal failure, our AAD options are limited Could consider multaq.  Ultimately, rate control may be our only option On eliquis for stroke prevention as per Dr Eden EmmsNishan  Return to see EP NP In 6 months merlin  Justin RangeJames Eual Lindstrom MD, [facility] 09/27/2016 3:26 PM

## 2016-09-27 NOTE — Patient Instructions (Addendum)
Medication Instructions:  Your physician recommends that you continue on your current medications as directed. Please refer to the Current Medication list given to you today.   Labwork: None ordered   Testing/Procedures: None ordered   Follow-Up:  Remote monitoring is used to monitor your Pacemaker from home. This monitoring reduces the number of office visits required to check your device to one time per year. It allows us to keep an eye on the functioning of your device to ensure it is working properly. You are scheduled for a device check from home on 12/27/16. You may send your transmission at any time that day. If you have a wireless device, the transmission will be sent automatically. After your physician reviews your transmission, you will receive a postcard with your next transmission date.   Your physician recommends that you schedule a follow-up appointment in: 6 months with Justin BalsamAmber Seiler, NP   Any Other Special Instructions Will Be Listed Below (If Applicable).     If you need a refill on your cardiac medications before your next appointment, please call your pharmacy.

## 2016-11-15 ENCOUNTER — Encounter: Payer: Medicare Other | Admitting: Internal Medicine

## 2016-12-27 ENCOUNTER — Ambulatory Visit (INDEPENDENT_AMBULATORY_CARE_PROVIDER_SITE_OTHER): Payer: Medicare Other | Admitting: *Deleted

## 2016-12-27 DIAGNOSIS — I495 Sick sinus syndrome: Secondary | ICD-10-CM

## 2016-12-27 NOTE — Progress Notes (Signed)
Remote pacemaker transmission.   

## 2016-12-28 LAB — CUP PACEART REMOTE DEVICE CHECK
Battery Remaining Longevity: 101 mo
Battery Remaining Percentage: 95.5 %
Battery Voltage: 2.99 V
Brady Statistic AP VP Percent: 99 %
Brady Statistic AP VS Percent: 1 %
Brady Statistic AS VP Percent: 1 %
Brady Statistic AS VS Percent: 1 %
Brady Statistic RA Percent Paced: 99 %
Brady Statistic RV Percent Paced: 99 %
Date Time Interrogation Session: 20180507085920
Implantable Lead Implant Date: 20170322
Implantable Lead Implant Date: 20170322
Implantable Lead Location: 753859
Implantable Lead Location: 753860
Implantable Pulse Generator Implant Date: 20170322
Lead Channel Impedance Value: 450 Ohm
Lead Channel Impedance Value: 530 Ohm
Lead Channel Pacing Threshold Amplitude: 0.5 V
Lead Channel Pacing Threshold Amplitude: 0.75 V
Lead Channel Pacing Threshold Pulse Width: 0.5 ms
Lead Channel Pacing Threshold Pulse Width: 0.5 ms
Lead Channel Sensing Intrinsic Amplitude: 12 mV
Lead Channel Sensing Intrinsic Amplitude: 2.5 mV
Lead Channel Setting Pacing Amplitude: 2 V
Lead Channel Setting Pacing Amplitude: 2.5 V
Lead Channel Setting Pacing Pulse Width: 0.5 ms
Lead Channel Setting Sensing Sensitivity: 2 mV
Pulse Gen Model: 2272
Pulse Gen Serial Number: 3134370

## 2016-12-31 ENCOUNTER — Encounter: Payer: Self-pay | Admitting: Cardiology

## 2017-01-23 ENCOUNTER — Other Ambulatory Visit: Payer: Self-pay | Admitting: Internal Medicine

## 2017-03-07 ENCOUNTER — Encounter: Payer: Self-pay | Admitting: *Deleted

## 2017-03-20 NOTE — Progress Notes (Signed)
Patient ID: Justin Baker Vickers, male   DOB: 1922/05/16, 83 y.o.   MRN: [id number]   03/21/2017 Justin Baker Bridgett   1922/05/16  [id number]  Primary Physician Patient, No Pcp Per Primary Cardiologist: [name]   Reason for Visit/CC:  Pacer/ PAF   HPI:  The patient is a 83 y.o.  Male, with a history of paroxysmal atrial fibrillation, first diagnosed in 2013. Due to high bleeding risk given his advanced age and high fall risk, he was not felt to be a candidate for long-term oral anticoagulation. He was previously on Eliquis but this was discontinued for the previous stated reasons. He also has a prior history of mild LV systolic dysfunction which has since improved. Echocardiogram 04/2012 revealed an ejection fraction of 40-45%. However he had a Myoview 07/11/2012 that was normal. EF at that time 65%.  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 Per discharge summary, he was not a candidate for TPA. He was noted to be in and out of atrial fibrillation during the time of his admission. Per records, an echocardiogram was obtained which revealed normal ejection fraction at 60-65%. There was mild concentric left ventricular hypertrophy. There was a structurally normal mitral valve with good mobility and mild mitral regurgitation. Aortic valve was also structurally normal with no significant stenosis or regurgitation. There was no evidence of pericardial effusion. Also no evidence of left atrial clot by TTE. It does not appear that a TEE was performed. It  was also noted that the neurologist at [facility] felt that he would be high risk for anticoagulation due to risk of bleeding and risk for falls. Neurology recommended to continue aspirin plus Plavix. The patient reports that he went to inpatient rehab and continued to have issues with recurrent atrial fibrillation. He was then seen by a cardiologist in [place] at the inpatient rehab center  who increased his metoprolol to 50 mg BID also recommended that he restart Eliquis, at a dose of 5 mg BID. His ASA and Plavix were both discontinued.   11/12/15 Had SJM Assurity MRI safe pacer implanted by Dr Graciela HusbandsKlein for tachy brady syndrome Last seen by [facility] EP 09/27/16 with normal pacer function and no PAF Amiodarone has been stopped due to abnormal PFTls  09/16/16  Baseline Cr is around 2.0 and in future only Multaq may be option if AAT needed   Current Outpatient Prescriptions  Medication Sig Dispense Refill  . apixaban (ELIQUIS) 2.5 MG TABS tablet Take 1 tablet (2.5 mg total) by mouth 2 (two) times daily. 180 tablet 3  . atorvastatin (LIPITOR) 40 MG tablet Take 1 tablet (40 mg total) by mouth daily. 90 tablet 3  . metoprolol tartrate (LOPRESSOR) 50 MG tablet TAKE ONE TABLET TWICE DAILY 180 tablet 1  . Misc Natural Products (OSTEO BI-FLEX ADV JOINT SHIELD PO) Take 2 tablets by mouth daily.     No current facility-administered medications for this visit.     No Known Allergies  Social History   Social History  . Marital status: Married    Spouse name: [name]  . Number of children: [name]  . Years of education: [name]   Occupational History  . Not on file.   Social History Main Topics  . Smoking status: Former Games developermoker  . Smokeless tobacco: Former NeurosurgeonUser    Quit date: 08/23/1976  . Alcohol use No  . Drug use: No  . Sexual activity: Not on file  Other Topics Concern  . Not on file   Social History Narrative  . No narrative on file     Review of Systems: General: negative for chills, fever, night sweats or weight changes.  Cardiovascular: negative for chest pain, dyspnea on exertion, edema, orthopnea, palpitations, paroxysmal nocturnal dyspnea or shortness of breath Dermatological: negative for rash Respiratory: negative for cough or wheezing Urologic: negative for hematuria Abdominal: negative for nausea, vomiting, diarrhea, bright red blood per rectum, melena, or  hematemesis Neurologic: negative for visual changes, syncope, or dizziness All other systems reviewed and are otherwise negative except as noted above.    Blood pressure 124/60, pulse 63, height 5\' 7"  (1.702 m), weight 196 lb (88.9 kg), SpO2 93 %.  General appearance: alert, cooperative and no distress Neck: no carotid bruit and no JVD Lungs: clear to auscultation bilaterally Heart: regular rate and rhythm, S1, S2 normal, no murmur, click, rub or gallop Extremities: no LEE Pulses: 2+ and symmetric Skin: warm and dry Neurologic: Grossly normal  EKG  06/16/15   sinus bradycardia. 45 bpm. 1st degree AVB.  09/02/16 AV pacing rate 56  ASSESSMENT AND PLAN:   1. PAF: currently in SR  Tolerating elquis with no bleeding issues Amiodarone d/c due to abnormal DLCO  2. CVA: speech is improved but not back to 100%. Continue low dose eliquis and statin   3. Pacer:  Normal function rate response adjusted but still with some fatigue f/u EP   PLAN  F/u with me in a year    Charlton Hawseter Flannery Cavallero  03/21/2017 4:31 PM

## 2017-03-21 ENCOUNTER — Encounter (INDEPENDENT_AMBULATORY_CARE_PROVIDER_SITE_OTHER): Payer: Self-pay

## 2017-03-21 ENCOUNTER — Ambulatory Visit (INDEPENDENT_AMBULATORY_CARE_PROVIDER_SITE_OTHER): Payer: Medicare Other | Admitting: Cardiovascular Disease

## 2017-03-21 ENCOUNTER — Encounter: Payer: Self-pay | Admitting: Cardiovascular Disease

## 2017-03-21 VITALS — BP 124/60 | HR 63 | Ht 67.0 in | Wt 196.0 lb

## 2017-03-21 DIAGNOSIS — I480 Paroxysmal atrial fibrillation: Secondary | ICD-10-CM | POA: Diagnosis not present

## 2017-03-21 NOTE — Patient Instructions (Signed)

## 2017-03-28 ENCOUNTER — Ambulatory Visit (INDEPENDENT_AMBULATORY_CARE_PROVIDER_SITE_OTHER): Payer: Medicare Other | Admitting: *Deleted

## 2017-03-28 DIAGNOSIS — I495 Sick sinus syndrome: Secondary | ICD-10-CM | POA: Diagnosis not present

## 2017-03-29 NOTE — Progress Notes (Signed)
Remote pacemaker transmission.   

## 2017-03-30 ENCOUNTER — Encounter: Payer: Self-pay | Admitting: Cardiology

## 2017-04-01 LAB — CUP PACEART REMOTE DEVICE CHECK
Brady Statistic AP VP Percent: 99 %
Brady Statistic AP VS Percent: 1 %
Brady Statistic AS VP Percent: 1 %
Brady Statistic AS VS Percent: 1 %
Brady Statistic RV Percent Paced: 99 %
Implantable Lead Implant Date: 20170322
Implantable Lead Location: 753859
Implantable Lead Location: 753860
Lead Channel Impedance Value: 490 Ohm
Lead Channel Pacing Threshold Amplitude: 0.5 V
Lead Channel Pacing Threshold Pulse Width: 0.5 ms
Lead Channel Sensing Intrinsic Amplitude: 12 mV
Lead Channel Sensing Intrinsic Amplitude: 3.6 mV
Lead Channel Setting Pacing Amplitude: 2 V
Lead Channel Setting Pacing Amplitude: 2.5 V
Lead Channel Setting Pacing Pulse Width: 0.5 ms
Lead Channel Setting Sensing Sensitivity: 2 mV
MDC IDC LEAD IMPLANT DT: 20170322
MDC IDC MSMT BATTERY REMAINING LONGEVITY: 101 mo
MDC IDC MSMT BATTERY REMAINING PERCENTAGE: 95.5 %
MDC IDC MSMT BATTERY VOLTAGE: 2.99 V
MDC IDC MSMT LEADCHNL RA IMPEDANCE VALUE: 480 Ohm
MDC IDC MSMT LEADCHNL RA PACING THRESHOLD AMPLITUDE: 0.75 V
MDC IDC MSMT LEADCHNL RA PACING THRESHOLD PULSEWIDTH: 0.5 ms
MDC IDC PG IMPLANT DT: 20170322
MDC IDC SESS DTM: 20180806074447
MDC IDC STAT BRADY RA PERCENT PACED: 99 %
Pulse Gen Model: 2272
Pulse Gen Serial Number: 3134370

## 2017-05-04 NOTE — Progress Notes (Signed)
Electrophysiology Office Note Date: 05/05/2017  ID:  Justin Baker, DOB 12/18/1921, MRN [id number]  PCP: Patient, No Pcp Per Primary Cardiologist: Eden Emms Electrophysiologist: Allred  CC: Pacemaker and AF follow up  Justin Baker is a 83 y.o. male seen today for Dr Johney Frame.  He presents today for routine EP follow up.  Since last being seen in clinic, the patient reports doing reasonably well. He has not had recurrent AF. He is tolerating Eliquis without bleeding complications. His wife has Alzheimer's and he is trying to care for her at home with the help of his sons.  He does not want to have to put her in a nursing home.  He denies chest pain, palpitations, dyspnea, PND, orthopnea, nausea, vomiting, dizziness, syncope, edema, weight gain, or early satiety.  Device History: STJ dual chamber PPM implanted 2017 for tachy/brady syndrome   Past Medical History:  Diagnosis Date  . Atrial fibrillation (HCC)   . Back pain   . COPD (chronic obstructive pulmonary disease) (HCC)    self diagnosed  . Osteoarthritis    s/p right total hip  . Shoulder pain, bilateral   . Stroke (HCC)   . Tachy-brady syndrome (HCC)    a. s/p STJ dual chamber PPM   . Thrombocytopenia (HCC)    Past Surgical History:  Procedure Laterality Date  . CARDIOVERSION  05/17/2012   Procedure: CARDIOVERSION;  Surgeon: Dolores Patty, MD;  Location[facility] END[facility]COPY;  Service: Cardiovascular;  Laterality: [name];  . CATARACT EXTRACTION W/ INTRAOCULAR LENS IMPLANT     right  . EP IMPLANTABLE DEVICE [name] 11/12/2015   SJM Assurity MRI DR PPM implanted by Dr Graciela Husbands for tachy/brady syndrome  . TEE WITHOUT CARDIOVERSION  05/17/2012   Procedure: TRANSESOPHAGEAL ECHOCARDIOGRAM (TEE);  Surgeon: Dolores Patty, MD;  Location[facility] ENDOSCOPY;  Service: Cardiovascular;  Laterality: [name];  . TOTAL HIP ARTHROPLASTY     right    Current Outpatient Prescriptions  Medication Sig Dispense Refill  . apixaban (ELIQUIS) 2.5 MG TABS tablet  Take 1 tablet (2.5 mg total) by mouth 2 (two) times daily. 180 tablet 3  . atorvastatin (LIPITOR) 40 MG tablet Take 1 tablet (40 mg total) by mouth daily. 90 tablet 3  . metoprolol tartrate (LOPRESSOR) 50 MG tablet TAKE ONE TABLET TWICE DAILY 180 tablet 1  . Misc Natural Products (OSTEO BI-FLEX ADV JOINT SHIELD PO) Take 2 tablets by mouth daily.     No current facility-administered medications for this visit.     Allergies:   Patient has no known allergies.   Social History: Social History   Social History  . Marital status: Married    Spouse name: [name]  . Number of children: [name]  . Years of education: [name]   Occupational History  . Not on file.   Social History Main Topics  . Smoking status: Former Games developer  . Smokeless tobacco: Former Neurosurgeon    Quit date: 08/23/1976  . Alcohol use No  . Drug use: No  . Sexual activity: Not on file   Other Topics Concern  . Not on file   Social History Narrative  . No narrative on file    Family History: Family History  Problem Relation Age of Onset  . Tuberculosis Father   . Other Unknown        No known heart disease     Review of Systems: All other systems reviewed and are otherwise negative except as noted above.   Physical Exam: VS:  BP Marland Kitchen)  142/74   Pulse 75   Ht 5\' 7"  (1.702 m)   Wt 199 lb (90.3 kg)   SpO2 95%   BMI 31.17 kg/m  , BMI Body mass index is 31.17 kg/m.  GEN- The patient is elderly appearing, alert and oriented x 3 today.   HEENT: normocephalic, atraumatic; sclera clear, conjunctiva pink; hearing intact; oropharynx clear; neck supple  Lungs- Clear to ausculation bilaterally, normal work of breathing.  No wheezes, rales, rhonchi Heart- Regular rate and rhythm (paced) GI- soft, non-tender, non-distended, bowel sounds present  Extremities- no clubbing, cyanosis, or edema  MS- no significant deformity or atrophy Skin- warm and dry, no rash or lesion; PPM pocket well healed Psych- euthymic mood, full  affect Neuro- strength and sensation are intact  PPM Interrogation- reviewed in detail today,  See PACEART report  EKG:  EKG is not ordered today.  Recent Labs: 05/18/2016: TSH 5.89   Wt Readings from Last 3 Encounters:  05/05/17 199 lb (90.3 kg)  03/21/17 196 lb (88.9 kg)  09/27/16 201 lb (91.2 kg)     Other studies Reviewed: Additional studies/ records that were reviewed today include: office notes  Assessment and Plan:  1.  Tachy/brady syndrome Normal PPM function See Pace Art report No changes today  2.  Paroxysmal atrial fibrillation  Maintaining SR off amiodarone (stopped 2/2 abnormal PFT's). If recurrence, AAD options are limited, could consider Multaq Continue Eliquis for CHADS2VASC of 4 (decreased dose 2/2 age and creat)  Current medicines are reviewed at length with the patient today.   The patient does not have concerns regarding his medicines.  The following changes were made today:  none  Labs/ tests ordered today include: none  Disposition:   Follow up with Merlin transmissions, Dr Eden EmmsNishan as scheduled, me in 1 year   Signed, Gypsy BalsamAmber Georgios Kina, NP 05/05/2017 12:08 PM  [facility] [address] [place] [address] [phone] (office) [phone] (fax)

## 2017-05-05 ENCOUNTER — Encounter: Payer: Self-pay | Admitting: Nurse Practitioner

## 2017-05-05 ENCOUNTER — Ambulatory Visit (INDEPENDENT_AMBULATORY_CARE_PROVIDER_SITE_OTHER): Payer: Medicare Other | Admitting: Nurse Practitioner

## 2017-05-05 VITALS — BP 142/74 | HR 75 | Ht 67.0 in | Wt 199.0 lb

## 2017-05-05 DIAGNOSIS — I495 Sick sinus syndrome: Secondary | ICD-10-CM | POA: Diagnosis not present

## 2017-05-05 DIAGNOSIS — I480 Paroxysmal atrial fibrillation: Secondary | ICD-10-CM

## 2017-05-05 NOTE — Patient Instructions (Addendum)
Medication Instructions: Your physician recommends that you continue on your current medications as directed. Please refer to the Current Medication list given to you today.  Labwork: None Ordered  Procedures/Testing: None Ordered  Follow-Up: Remote monitoring is used to monitor your Pacemaker from home. This monitoring reduces the number of office visits required to check your device to one time per year. It allows us to keep an eye on the functioning of your device to ensure it is working properly. You are scheduled for a device check from home on 06/27/17. You may send your transmission at any time that day. If you have a wireless device, the transmission will be sent automatically. After your physician reviews your transmission, you will receive a postcard with your next transmission date.    Your physician wants you to follow-up in: 1 YEAR with Gypsy BalsamAmber Seiler, NP. You will receive a reminder letter in the mail two months in advance. If you don't receive a letter, please call our office to schedule the follow-up appointment.   Any Additional Special Instructions Will Be Listed Below (If Applicable).     If you need a refill on your cardiac medications before your next appointment, please call your pharmacy.

## 2017-05-30 LAB — CUP PACEART INCLINIC DEVICE CHECK
Implantable Lead Implant Date: 20170322
Implantable Lead Location: 753859
Implantable Pulse Generator Implant Date: 20170322
MDC IDC LEAD IMPLANT DT: 20170322
MDC IDC LEAD LOCATION: 753860
MDC IDC PG SERIAL: 3134370
MDC IDC SESS DTM: 20181008074911
Pulse Gen Model: 2272

## 2017-06-06 ENCOUNTER — Other Ambulatory Visit: Payer: Self-pay | Admitting: *Deleted

## 2017-06-06 MED ORDER — APIXABAN 2.5 MG PO TABS: 2.5000 mg | ORAL_TABLET | Freq: Two times a day (BID) | ORAL | 0 refills | Status: DC

## 2017-06-27 ENCOUNTER — Ambulatory Visit (INDEPENDENT_AMBULATORY_CARE_PROVIDER_SITE_OTHER): Payer: Medicare Other | Admitting: *Deleted

## 2017-06-27 DIAGNOSIS — I495 Sick sinus syndrome: Secondary | ICD-10-CM | POA: Diagnosis not present

## 2017-06-27 NOTE — Progress Notes (Signed)
Remote pacemaker transmission.   

## 2017-06-29 ENCOUNTER — Encounter: Payer: Self-pay | Admitting: Cardiology

## 2017-07-12 LAB — CUP PACEART REMOTE DEVICE CHECK
Battery Remaining Longevity: 99 mo
Brady Statistic AP VS Percent: 1 %
Brady Statistic AS VP Percent: 1.2 %
Brady Statistic RA Percent Paced: 99 %
Brady Statistic RV Percent Paced: 99 %
Date Time Interrogation Session: 20181105173926
Implantable Lead Implant Date: 20170322
Implantable Lead Location: 753859
Implantable Lead Location: 753860
Lead Channel Pacing Threshold Amplitude: 0.5 V
Lead Channel Pacing Threshold Pulse Width: 0.5 ms
Lead Channel Sensing Intrinsic Amplitude: 9.7 mV
Lead Channel Setting Pacing Amplitude: 2 V
Lead Channel Setting Pacing Pulse Width: 0.5 ms
Lead Channel Setting Sensing Sensitivity: 2 mV
MDC IDC LEAD IMPLANT DT: 20170322
MDC IDC MSMT BATTERY REMAINING PERCENTAGE: 95.5 %
MDC IDC MSMT BATTERY VOLTAGE: 2.98 V
MDC IDC MSMT LEADCHNL RA IMPEDANCE VALUE: 440 Ohm
MDC IDC MSMT LEADCHNL RA PACING THRESHOLD AMPLITUDE: 0.75 V
MDC IDC MSMT LEADCHNL RA SENSING INTR AMPL: 1 mV
MDC IDC MSMT LEADCHNL RV IMPEDANCE VALUE: 460 Ohm
MDC IDC MSMT LEADCHNL RV PACING THRESHOLD PULSEWIDTH: 0.5 ms
MDC IDC PG IMPLANT DT: 20170322
MDC IDC SET LEADCHNL RV PACING AMPLITUDE: 2.5 V
MDC IDC STAT BRADY AP VP PERCENT: 99 %
MDC IDC STAT BRADY AS VS PERCENT: 1 %
Pulse Gen Model: 2272
Pulse Gen Serial Number: 3134370

## 2017-07-19 ENCOUNTER — Other Ambulatory Visit: Payer: Self-pay

## 2017-07-19 MED ORDER — ATORVASTATIN CALCIUM 40 MG PO TABS: 40.0000 mg | ORAL_TABLET | Freq: Every day | ORAL | 3 refills | Status: DC

## 2017-07-19 NOTE — Telephone Encounter (Signed)
YES.  RX FILLED.

## 2017-09-05 ENCOUNTER — Other Ambulatory Visit: Payer: Self-pay | Admitting: Cardiovascular Disease

## 2017-09-05 ENCOUNTER — Other Ambulatory Visit: Payer: Self-pay | Admitting: Internal Medicine

## 2017-09-05 DIAGNOSIS — I4891 Unspecified atrial fibrillation: Secondary | ICD-10-CM

## 2017-09-09 ENCOUNTER — Other Ambulatory Visit: Payer: Medicare Other | Admitting: *Deleted

## 2017-09-09 DIAGNOSIS — I4891 Unspecified atrial fibrillation: Secondary | ICD-10-CM

## 2017-09-09 LAB — CBC
HEMATOCRIT: 41.9 % (ref 37.5–51.0)
Hemoglobin: 14.5 g/dL (ref 13.0–17.7)
MCH: 33.6 pg — AB (ref 26.6–33.0)
MCHC: 34.6 g/dL (ref 31.5–35.7)
MCV: 97 fL (ref 79–97)
PLATELETS: 169 10*3/uL (ref 150–379)
RBC: 4.32 x10E6/uL (ref 4.14–5.80)
RDW: 13.3 % (ref 12.3–15.4)
WBC: 10.1 10*3/uL (ref 3.4–10.8)

## 2017-09-09 LAB — BASIC METABOLIC PANEL
BUN / CREAT RATIO: 16 (ref 10–24)
BUN: 24 mg/dL (ref 10–36)
CO2: 24 mmol/L (ref 20–29)
Calcium: 9.2 mg/dL (ref 8.6–10.2)
Chloride: 105 mmol/L (ref 96–106)
Creatinine, Ser: 1.5 mg/dL — ABNORMAL HIGH (ref 0.76–1.27)
GFR, EST AFRICAN AMERICAN: 46 mL/min/{1.73_m2} — AB (ref >59)
GFR, EST NON AFRICAN AMERICAN: 40 mL/min/{1.73_m2} — AB (ref >59)
Glucose: 94 mg/dL (ref 65–99)
Potassium: 4.3 mmol/L (ref 3.5–5.2)
Sodium: 145 mmol/L — ABNORMAL HIGH (ref 134–144)

## 2017-09-26 ENCOUNTER — Ambulatory Visit (INDEPENDENT_AMBULATORY_CARE_PROVIDER_SITE_OTHER): Payer: Medicare Other | Admitting: *Deleted

## 2017-09-26 DIAGNOSIS — I495 Sick sinus syndrome: Secondary | ICD-10-CM | POA: Diagnosis not present

## 2017-09-26 NOTE — Progress Notes (Signed)
Remote pacemaker transmission.   

## 2017-09-27 ENCOUNTER — Encounter: Payer: Self-pay | Admitting: Cardiology

## 2017-10-10 ENCOUNTER — Telehealth: Payer: Self-pay | Admitting: Cardiovascular Disease

## 2017-10-10 ENCOUNTER — Other Ambulatory Visit: Payer: Self-pay | Admitting: Internal Medicine

## 2017-10-10 MED ORDER — OSELTAMIVIR PHOSPHATE 75 MG PO CAPS: 75.0000 mg | ORAL_CAPSULE | Freq: Every day | ORAL | 0 refills | Status: DC

## 2017-10-10 NOTE — Telephone Encounter (Signed)
Ok to call him in tamiful

## 2017-10-10 NOTE — Telephone Encounter (Signed)
Pt last saw Dr Eden EmmsNishan 03/21/17, last labs 09/09/17 Creat 1.5, age 84, weight 90.3kg, based on specified criteria pt is on appropriate dosage of Eliquis 2.5mg  BID.  Will refill rx.

## 2017-10-10 NOTE — Telephone Encounter (Signed)
Patient stated his wife is in the hospital with the Flu, and that the nurses told him to get a prescription for Tamiflu. Patient stated he does not have a PCP to call. Patient stated he has no symptoms at this time. Will forward to [name] for advisement.

## 2017-10-10 NOTE — Telephone Encounter (Signed)
Called and spoke with patient, he is aware that Tamiflu has been sent to his pharmacy and he is aware on how to take medication.

## 2017-10-10 NOTE — Telephone Encounter (Signed)
Yes that's fine 

## 2017-10-10 NOTE — Telephone Encounter (Signed)
Recommendation for post exposure prophylaxis is Tamiflu 75 mg by mouth daily for 10 days. Will clarify with [name].

## 2017-10-10 NOTE — Telephone Encounter (Signed)
Justin Baker is calling to see if [name] can write him a prescription for Tama-Flu . Does not have a PCP . Please call

## 2017-10-14 LAB — CUP PACEART REMOTE DEVICE CHECK
Battery Remaining Longevity: 100 mo
Battery Voltage: 2.98 V
Brady Statistic AP VS Percent: 1 %
Brady Statistic AS VS Percent: 1 %
Brady Statistic RA Percent Paced: 98 %
Implantable Lead Implant Date: 20170322
Implantable Lead Implant Date: 20170322
Implantable Lead Location: 753859
Implantable Pulse Generator Implant Date: 20170322
Lead Channel Impedance Value: 450 Ohm
Lead Channel Pacing Threshold Amplitude: 0.75 V
Lead Channel Pacing Threshold Pulse Width: 0.5 ms
Lead Channel Pacing Threshold Pulse Width: 0.5 ms
Lead Channel Sensing Intrinsic Amplitude: 0.9 mV
Lead Channel Setting Pacing Amplitude: 2.5 V
MDC IDC LEAD LOCATION: 753860
MDC IDC MSMT BATTERY REMAINING PERCENTAGE: 95.5 %
MDC IDC MSMT LEADCHNL RV IMPEDANCE VALUE: 460 Ohm
MDC IDC MSMT LEADCHNL RV PACING THRESHOLD AMPLITUDE: 0.5 V
MDC IDC MSMT LEADCHNL RV SENSING INTR AMPL: 9.7 mV
MDC IDC PG SERIAL: 3134370
MDC IDC SESS DTM: 20190204070450
MDC IDC SET LEADCHNL RA PACING AMPLITUDE: 2 V
MDC IDC SET LEADCHNL RV PACING PULSEWIDTH: 0.5 ms
MDC IDC SET LEADCHNL RV SENSING SENSITIVITY: 2 mV
MDC IDC STAT BRADY AP VP PERCENT: 98 %
MDC IDC STAT BRADY AS VP PERCENT: 2.1 %
MDC IDC STAT BRADY RV PERCENT PACED: 99 %
Pulse Gen Model: 2272

## 2017-12-26 ENCOUNTER — Ambulatory Visit (INDEPENDENT_AMBULATORY_CARE_PROVIDER_SITE_OTHER): Payer: Medicare Other | Admitting: *Deleted

## 2017-12-26 DIAGNOSIS — I495 Sick sinus syndrome: Secondary | ICD-10-CM

## 2017-12-27 ENCOUNTER — Telehealth: Payer: Self-pay | Admitting: Cardiology

## 2017-12-27 NOTE — Telephone Encounter (Signed)
Spoke with pt and reminded pt of remote transmission that is due today. Pt verbalized understanding.   

## 2017-12-28 ENCOUNTER — Encounter: Payer: Self-pay | Admitting: Cardiology

## 2017-12-28 NOTE — Progress Notes (Signed)
Remote pacemaker transmission.   

## 2018-01-09 LAB — CUP PACEART REMOTE DEVICE CHECK
Battery Voltage: 2.98 V
Brady Statistic AP VP Percent: 98 %
Brady Statistic RA Percent Paced: 97 %
Brady Statistic RV Percent Paced: 99 %
Implantable Lead Implant Date: 20170322
Implantable Lead Location: 753859
Implantable Pulse Generator Implant Date: 20170322
Lead Channel Impedance Value: 460 Ohm
Lead Channel Pacing Threshold Amplitude: 0.75 V
Lead Channel Pacing Threshold Pulse Width: 0.5 ms
Lead Channel Sensing Intrinsic Amplitude: 5 mV
Lead Channel Setting Pacing Amplitude: 2 V
Lead Channel Setting Sensing Sensitivity: 2 mV
MDC IDC LEAD IMPLANT DT: 20170322
MDC IDC LEAD LOCATION: 753860
MDC IDC MSMT BATTERY REMAINING LONGEVITY: 99 mo
MDC IDC MSMT BATTERY REMAINING PERCENTAGE: 95.5 %
MDC IDC MSMT LEADCHNL RA IMPEDANCE VALUE: 450 Ohm
MDC IDC MSMT LEADCHNL RV PACING THRESHOLD AMPLITUDE: 0.5 V
MDC IDC MSMT LEADCHNL RV PACING THRESHOLD PULSEWIDTH: 0.5 ms
MDC IDC MSMT LEADCHNL RV SENSING INTR AMPL: 11.1 mV
MDC IDC SESS DTM: 20190507174118
MDC IDC SET LEADCHNL RV PACING AMPLITUDE: 2.5 V
MDC IDC SET LEADCHNL RV PACING PULSEWIDTH: 0.5 ms
MDC IDC STAT BRADY AP VS PERCENT: 1 %
MDC IDC STAT BRADY AS VP PERCENT: 2.3 %
MDC IDC STAT BRADY AS VS PERCENT: 1 %
Pulse Gen Model: 2272
Pulse Gen Serial Number: 3134370

## 2018-01-23 ENCOUNTER — Other Ambulatory Visit: Payer: Self-pay | Admitting: *Deleted

## 2018-01-23 MED ORDER — METOPROLOL TARTRATE 50 MG PO TABS: 50.0000 mg | ORAL_TABLET | Freq: Two times a day (BID) | ORAL | 0 refills | Status: DC

## 2018-01-29 IMAGING — CR DG CHEST 2V
2 series · 2 of 2 positions shown · non-contrast
Comparison: 11/10/2015.  07/01/2014.

CLINICAL DATA: Pacemaker.

EXAM:
CHEST  2 VIEW

[chest pa]
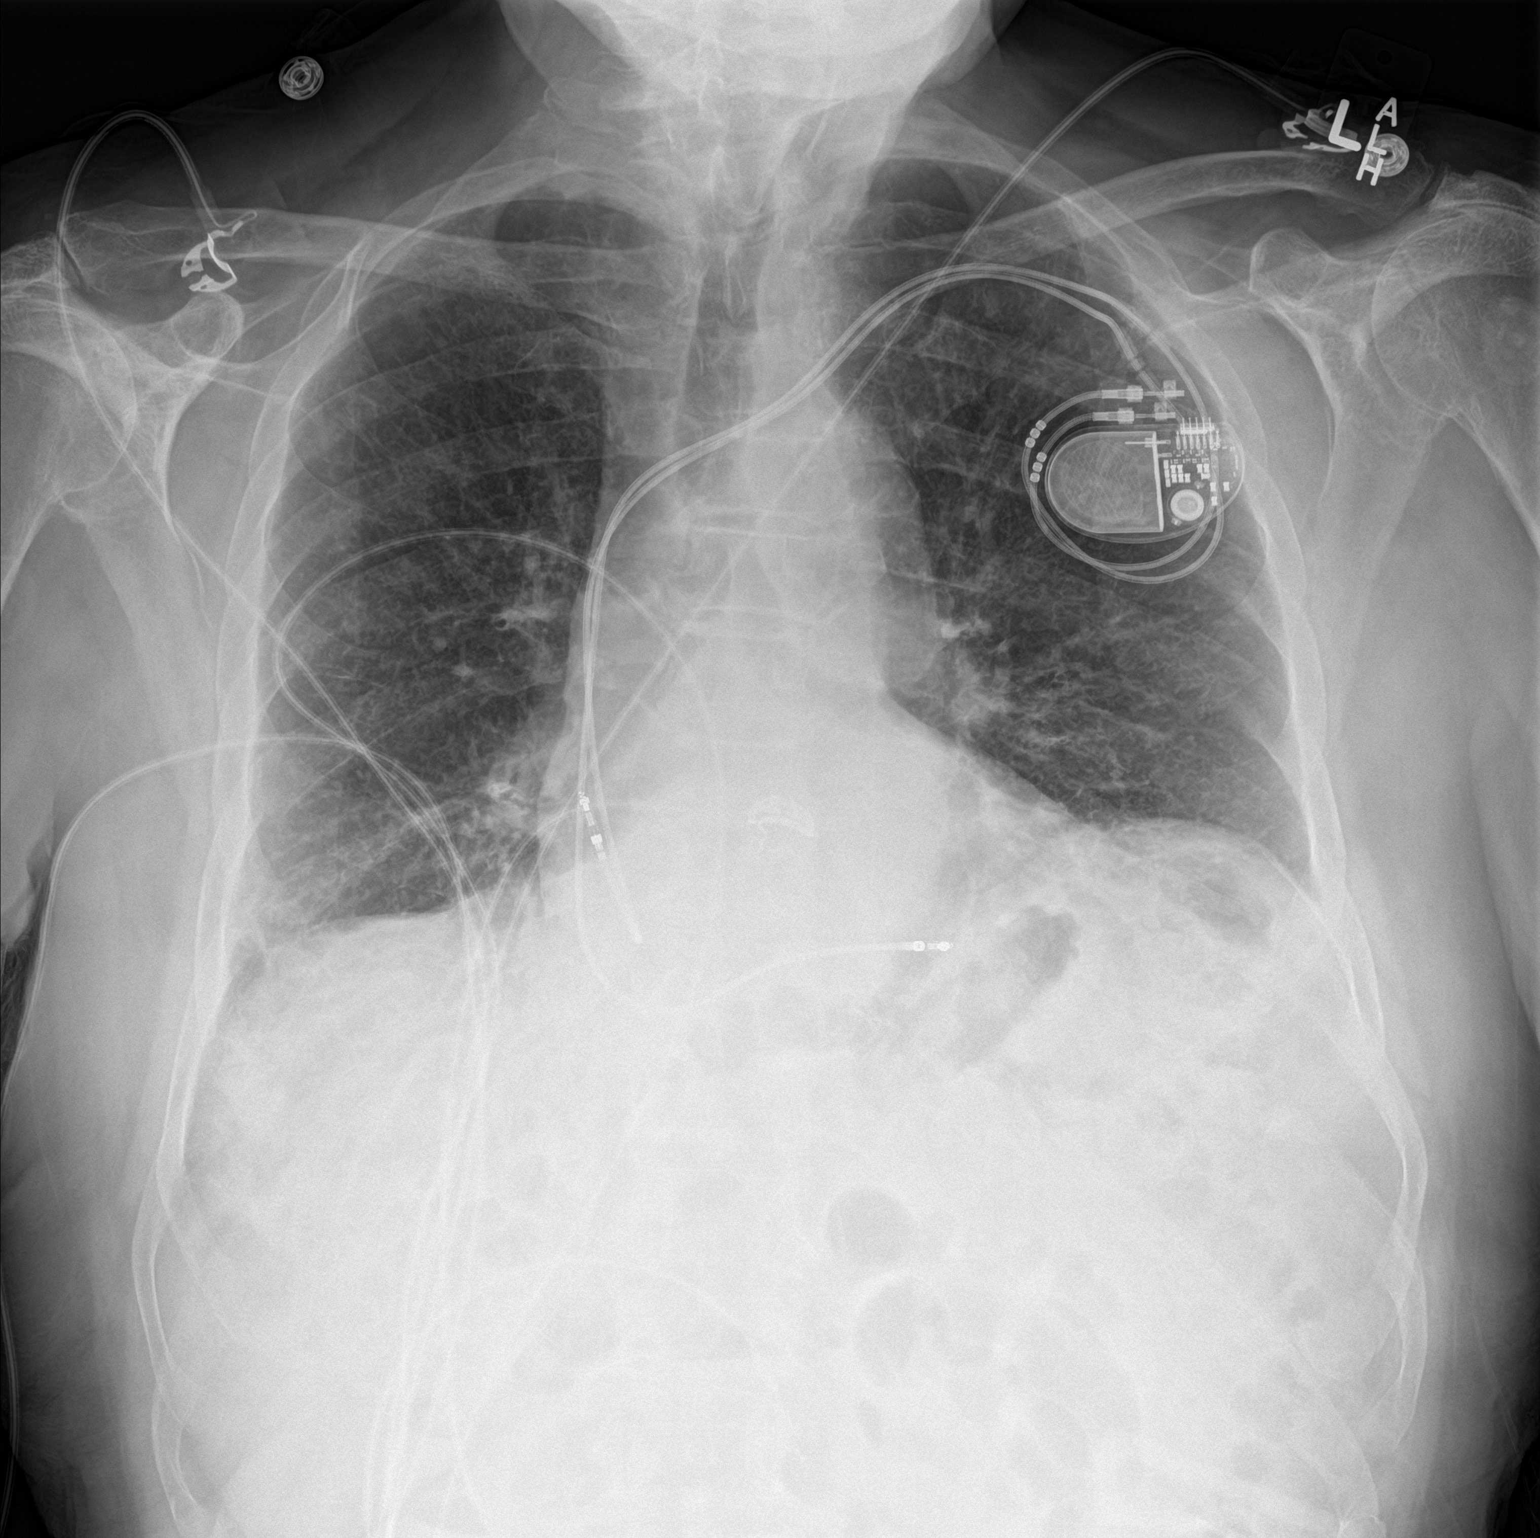

[chest lat]
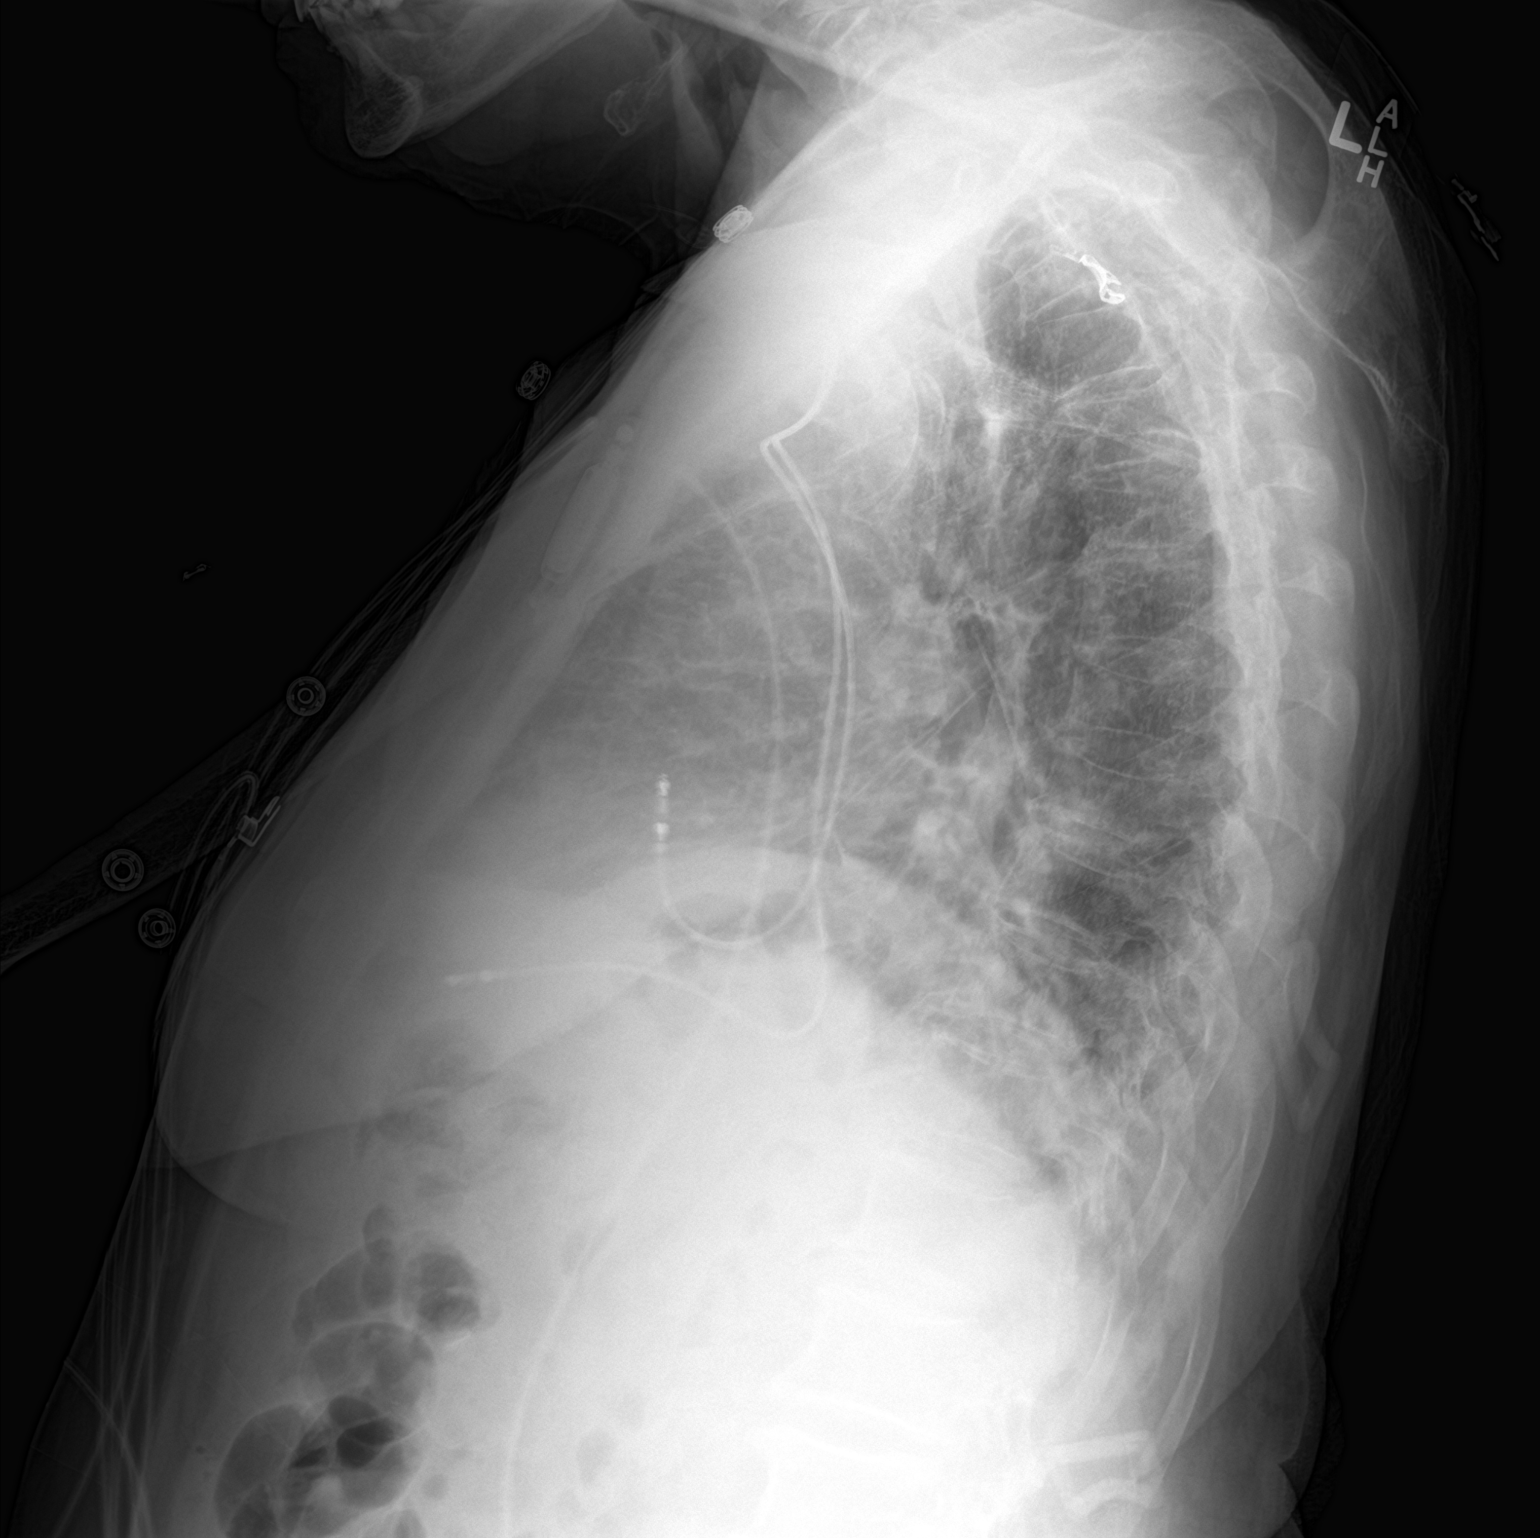

[2 of 2 positions shown; findings below may reference images not displayed]

FINDINGS: Cardiac pacer with lead tips in right atrium right ventricle. Heart
size normal. Pulmonary vascularity normal. Low lung volumes with
mild bibasilar atelectasis. Diffuse mild interstitial prominence
again noted consistent with chronic interstitial lung disease. No
interim change. No pleural effusion or pneumothorax.
IMPRESSION: 1. Cardiac pacer in stable position.

2. Stable bilateral interstitial prominence consistent chronic
interstitial lung disease. Mild basilar atelectasis .

## 2018-03-27 ENCOUNTER — Ambulatory Visit (INDEPENDENT_AMBULATORY_CARE_PROVIDER_SITE_OTHER): Payer: Medicare Other | Admitting: *Deleted

## 2018-03-27 DIAGNOSIS — I495 Sick sinus syndrome: Secondary | ICD-10-CM | POA: Diagnosis not present

## 2018-03-29 ENCOUNTER — Telehealth: Payer: Self-pay

## 2018-03-29 NOTE — Telephone Encounter (Signed)
Spoke with pt and reminded pt of remote transmission that is due today. LMOVM reminding pt to send remote transmission.

## 2018-03-29 NOTE — Progress Notes (Signed)
Remote pacemaker transmission.   

## 2018-03-29 NOTE — Progress Notes (Signed)
Patient ID: Justin Baker, male   DOB: April 04, 1922, 84 y.o.   MRN: [id number]   03/30/2018 Justin Baker   April 04, 1922  [id number]  Primary Physician Patient, No Pcp Per Primary Cardiologist: [name]   Reason for Visit/CC:  Pacer/ PAF   HPI: 84 y.o. with PAF first diagnosed in 2013. Initially poor risk for anticoagulation due to age and fall risk but had CVA at [facility] 2016. TTE showed normal EF. Telemetry with PaF. Eventually settled on low dose Eliquis. CHADS2VASC 4   11/12/15 Had SJM Assurity MRI safe pacer implanted by Dr Graciela HusbandsKlein for tachy brady syndrome Last seen by [facility] EP 09/27/16 with normal pacer function and no PAF Amiodarone has been stopped due to abnormal PFTls  09/16/16   Baseline Cr is around 2.0 and in future only Multaq may be option if AAT needed   Some exertional dyspnea but not bad and no clinical CHF  Current Outpatient Medications  Medication Sig Dispense Refill  . atorvastatin (LIPITOR) 40 MG tablet Take 1 tablet (40 mg total) by mouth daily. 90 tablet 3  . ELIQUIS 2.5 MG TABS tablet TAKE ONE TABLET BY MOUTH TWICE DAILY 60 tablet 5  . metoprolol tartrate (LOPRESSOR) 50 MG tablet Take 1 tablet (50 mg total) by mouth 2 (two) times daily. 180 tablet 0  . Misc Natural Products (OSTEO BI-FLEX ADV JOINT SHIELD PO) Take 2 tablets by mouth daily.     No current facility-administered medications for this visit.     No Known Allergies  Social History   Socioeconomic History  . Marital status: Married    Spouse name: Not on file  . Number of children: Not on file  . Years of education: Not on file  . Highest education level: Not on file  Occupational History  . Not on file  Social Needs  . Financial resource strain: Not on file  . Food insecurity:    Worry: Not on file    Inability: Not on file  . Transportation needs:    Medical: Not on file    Non-medical: Not on file  Tobacco Use  . Smoking status: Former Games developermoker  . Smokeless tobacco: Former NeurosurgeonUser   Quit date: 08/23/1976  Substance and Sexual Activity  . Alcohol use: No  . Drug use: No  . Sexual activity: Not on file  Lifestyle  . Physical activity:    Days per week: Not on file    Minutes per session: Not on file  . Stress: Not on file  Relationships  . Social connections:    Talks on phone: Not on file    Gets together: Not on file    Attends religious service: Not on file    Active member of club or organization: Not on file    Attends meetings of clubs or organizations: Not on file    Relationship status: Not on file  . Intimate partner violence:    Fear of current or ex partner: Not on file    Emotionally abused: Not on file    Physically abused: Not on file    Forced sexual activity: Not on file  Other Topics Concern  . Not on file  Social History Narrative  . Not on file     Review of Systems: General: negative for chills, fever, night sweats or weight changes.  Cardiovascular: negative for chest pain, dyspnea on exertion, edema, orthopnea, palpitations, paroxysmal nocturnal dyspnea or shortness of breath Dermatological: negative for rash Respiratory: negative for  cough or wheezing Urologic: negative for hematuria Abdominal: negative for nausea, vomiting, diarrhea, bright red blood per rectum, melena, or hematemesis Neurologic: negative for visual changes, syncope, or dizziness All other systems reviewed and are otherwise negative except as noted above.    Blood pressure 132/84, pulse 72, height 5\' 7"  (1.702 m), weight 201 lb 8 oz (91.4 kg), SpO2 92 %.   Affect appropriate Chronically ill male  HEENT: normal Neck supple with no adenopathy JVP normal no bruits no thyromegaly Lungs clear with no wheezing and good diaphragmatic motion Heart:  S1/S2 no murmur, no rub, gallop or click PMI normal pacer under left clavicle  Abdomen: benighn, BS positve, no tenderness, no AAA no bruit.  No HSM or HJR Distal pulses intact with no bruits No edema Neuro lest  dysarthria improved  Skin warm and dry No muscular weakness   EKG  06/16/15   sinus bradycardia. 45 bpm. 1st degree AVB.  09/02/16 AV pacing rate 56 03/30/18 AV pacing rate 63 no changes   ASSESSMENT AND PLAN:   1. PAF: currently in SR  Tolerating elquis with no bleeding issues Amiodarone d/c due to abnormal DLCO  2. CVA: speech is improved but not back to 100%. Continue low dose eliquis and statin   3. Pacer:  Normal function rate response adjusted but still with some fatigue f/u EP   PLAN  F/u with me in a year    [name]  03/30/2018 2:13 PM

## 2018-03-30 ENCOUNTER — Ambulatory Visit: Payer: Medicare Other | Admitting: Cardiovascular Disease

## 2018-03-30 ENCOUNTER — Encounter: Payer: Self-pay | Admitting: Cardiovascular Disease

## 2018-03-30 VITALS — BP 132/84 | HR 72 | Ht 67.0 in | Wt 201.5 lb

## 2018-03-30 DIAGNOSIS — I480 Paroxysmal atrial fibrillation: Secondary | ICD-10-CM | POA: Diagnosis not present

## 2018-03-30 NOTE — Patient Instructions (Addendum)

## 2018-04-03 ENCOUNTER — Other Ambulatory Visit: Payer: Self-pay | Admitting: Cardiovascular Disease

## 2018-04-03 NOTE — Telephone Encounter (Signed)
Pt last saw Dr Eden EmmsNishan 03/30/18, last labs 09/09/17 Creat 1.50, age 84, weight 91.4kg, based on specified criteria pt is on the appropriate doage of Eliquis 2.5mg  BID.  Will refill rx.

## 2018-04-24 ENCOUNTER — Other Ambulatory Visit: Payer: Self-pay | Admitting: Cardiovascular Disease

## 2018-04-25 LAB — CUP PACEART REMOTE DEVICE CHECK
Battery Voltage: 2.98 V
Brady Statistic AP VP Percent: 97 %
Brady Statistic AS VS Percent: 1 %
Brady Statistic RV Percent Paced: 99 %
Date Time Interrogation Session: 20190807184336
Implantable Lead Implant Date: 20170322
Implantable Lead Implant Date: 20170322
Implantable Pulse Generator Implant Date: 20170322
Lead Channel Impedance Value: 410 Ohm
Lead Channel Pacing Threshold Amplitude: 0.75 V
Lead Channel Pacing Threshold Pulse Width: 0.5 ms
Lead Channel Sensing Intrinsic Amplitude: 1.1 mV
Lead Channel Setting Pacing Amplitude: 2.5 V
Lead Channel Setting Sensing Sensitivity: 2 mV
MDC IDC LEAD LOCATION: 753859
MDC IDC LEAD LOCATION: 753860
MDC IDC MSMT BATTERY REMAINING LONGEVITY: 97 mo
MDC IDC MSMT BATTERY REMAINING PERCENTAGE: 95.5 %
MDC IDC MSMT LEADCHNL RA IMPEDANCE VALUE: 440 Ohm
MDC IDC MSMT LEADCHNL RV PACING THRESHOLD AMPLITUDE: 0.5 V
MDC IDC MSMT LEADCHNL RV PACING THRESHOLD PULSEWIDTH: 0.5 ms
MDC IDC MSMT LEADCHNL RV SENSING INTR AMPL: 9 mV
MDC IDC PG SERIAL: 3134370
MDC IDC SET LEADCHNL RA PACING AMPLITUDE: 2 V
MDC IDC SET LEADCHNL RV PACING PULSEWIDTH: 0.5 ms
MDC IDC STAT BRADY AP VS PERCENT: 1 %
MDC IDC STAT BRADY AS VP PERCENT: 2.5 %
MDC IDC STAT BRADY RA PERCENT PACED: 97 %
Pulse Gen Model: 2272

## 2018-05-31 NOTE — Progress Notes (Signed)
Electrophysiology Office Note Date: 06/01/2018  ID:  Justin Baker, DOB Mar 13, 1922, MRN [id number]  PCP: Patient, No Pcp Per Primary Cardiologist: Eden Emms Electrophysiologist: Allred  CC: Pacemaker follow-up  Justin Baker is a 84 y.o. male seen today for Dr Johney Frame.  He presents today for routine electrophysiology followup.  Since last being seen in our clinic, the patient reports doing reasonably well.  He continues to be the primary caregiver for his wife who has Alzheimers. He does not want to put her in a nursing home.  He is not aware of recurrence of AF.  He denies chest pain, palpitations, dyspnea, PND, orthopnea, nausea, vomiting, dizziness, syncope, edema, weight gain, or early satiety.  Device History: STJ dual chamber PPM implanted 2017 for tachy/brady   Past Medical History:  Diagnosis Date  . Atrial fibrillation (HCC)   . Back pain   . COPD (chronic obstructive pulmonary disease) (HCC)    self diagnosed  . Osteoarthritis    s/p right total hip  . Shoulder pain, bilateral   . Stroke (HCC)   . Tachy-brady syndrome (HCC)    a. s/p STJ dual chamber PPM   . Thrombocytopenia (HCC)    Past Surgical History:  Procedure Laterality Date  . CARDIOVERSION  05/17/2012   Procedure: CARDIOVERSION;  Surgeon: Dolores Patty, MD;  Location: [facility] ENDOSCOPY;  Service: Cardiovascular;  Laterality: [name];  . CATARACT EXTRACTION W/ INTRAOCULAR LENS IMPLANT     right  . EP IMPLANTABLE DEVICE [name] 11/12/2015   SJM Assurity MRI DR PPM implanted by Dr Graciela Husbands for tachy/brady syndrome  . TEE WITHOUT CARDIOVERSION  05/17/2012   Procedure: TRANSESOPHAGEAL ECHOCARDIOGRAM (TEE);  Surgeon: Dolores Patty, MD;  Location: [facility] ENDOSCOPY;  Service: Cardiovascular;  Laterality: [name];  . TOTAL HIP ARTHROPLASTY     right    Current Outpatient Medications  Medication Sig Dispense Refill  . atorvastatin (LIPITOR) 40 MG tablet Take 1 tablet (40 mg total) by mouth daily. 90 tablet 3  . ELIQUIS 2.5 MG  TABS tablet TAKE ONE TABLET BY MOUTH TWICE DAILY 60 tablet 5  . metoprolol tartrate (LOPRESSOR) 50 MG tablet TAKE ONE TABLET BY MOUTH TWICE DAILY 180 tablet 3  . Misc Natural Products (OSTEO BI-FLEX ADV JOINT SHIELD PO) Take 2 tablets by mouth daily.     No current facility-administered medications for this visit.     Allergies:   Patient has no known allergies.   Social History: Social History   Socioeconomic History  . Marital status: Married    Spouse name: Not on file  . Number of children: Not on file  . Years of education: Not on file  . Highest education level: Not on file  Occupational History  . Not on file  Social Needs  . Financial resource strain: Not on file  . Food insecurity:    Worry: Not on file    Inability: Not on file  . Transportation needs:    Medical: Not on file    Non-medical: Not on file  Tobacco Use  . Smoking status: Former Games developer  . Smokeless tobacco: Former Neurosurgeon    Quit date: 08/23/1976  Substance and Sexual Activity  . Alcohol use: No  . Drug use: No  . Sexual activity: Not on file  Lifestyle  . Physical activity:    Days per week: Not on file    Minutes per session: Not on file  . Stress: Not on file  Relationships  . Social connections:  Talks on phone: Not on file    Gets together: Not on file    Attends religious service: Not on file    Active member of club or organization: Not on file    Attends meetings of clubs or organizations: Not on file    Relationship status: Not on file  . Intimate partner violence:    Fear of current or ex partner: Not on file    Emotionally abused: Not on file    Physically abused: Not on file    Forced sexual activity: Not on file  Other Topics Concern  . Not on file  Social History Narrative  . Not on file    Family History: Family History  Problem Relation Age of Onset  . Tuberculosis Father   . Other Unknown        No known heart disease     Review of Systems: All other systems  reviewed and are otherwise negative except as noted above.   Physical Exam: VS:  BP 120/82   Pulse 61   Ht 5\' 7"  (1.702 m)   Wt 199 lb 6.4 oz (90.4 kg)   SpO2 90%   BMI 31.23 kg/m  , BMI Body mass index is 31.23 kg/m.  GEN- The patient is elderly appearing, alert and oriented x 3 today.   HEENT: normocephalic, atraumatic; sclera clear, conjunctiva pink; hearing intact; oropharynx clear; neck supple  Lungs- Clear to ausculation bilaterally, normal work of breathing.  No wheezes, rales, rhonchi Heart- Regular rate and rhythm GI- soft, non-tender, non-distended, bowel sounds present  Extremities- no clubbing, cyanosis, or edema  MS- no significant deformity or atrophy Skin- warm and dry, no rash or lesion; PPM pocket well healed Psych- euthymic mood, full affect Neuro- strength and sensation are intact  PPM Interrogation- reviewed in detail today,  See PACEART report  EKG:  EKG is not ordered today.  Recent Labs: 09/09/2017: BUN 24; Creatinine, Ser 1.50; Hemoglobin 14.5; Platelets 169; Potassium 4.3; Sodium 145   Wt Readings from Last 3 Encounters:  06/01/18 199 lb 6.4 oz (90.4 kg)  03/30/18 201 lb 8 oz (91.4 kg)  05/05/17 199 lb (90.3 kg)      Assessment and Plan:  1.  Tachy/brady syndrome Normal PPM function See Pace Art report No changes today  2.  Paroxysmal atrial fibrillation Maintaining SR off Amiodarone (stopped 2/2 abnormal PFT's).  If recurrence, AAD options are limited to Multaq Continue Eliquis for CHADS2VASC of 4 (decreased dose 2/2 age and creatinine)   Current medicines are reviewed at length with the patient today.   The patient does not have concerns regarding his medicines.  The following changes were made today:  none  Labs/ tests ordered today include: none   Disposition:   Follow up with Arsenio Katz, Dr Eden Emms as scheduled, me in 1 year   Signed, Gypsy Balsam, NP 06/01/2018 12:50 PM  [facility] [address] [place] [address] [phone] (office) [phone] (fax)

## 2018-06-01 ENCOUNTER — Encounter: Payer: Self-pay | Admitting: Nurse Practitioner

## 2018-06-01 ENCOUNTER — Ambulatory Visit: Payer: Medicare Other | Admitting: Nurse Practitioner

## 2018-06-01 VITALS — BP 120/82 | HR 61 | Ht 67.0 in | Wt 199.4 lb

## 2018-06-01 DIAGNOSIS — I495 Sick sinus syndrome: Secondary | ICD-10-CM | POA: Diagnosis not present

## 2018-06-01 DIAGNOSIS — I480 Paroxysmal atrial fibrillation: Secondary | ICD-10-CM | POA: Diagnosis not present

## 2018-06-01 NOTE — Patient Instructions (Addendum)
Medication Instructions:  Your physician recommends that you continue on your current medications as directed. Please refer to the Current Medication list given to you today.  -- If you need a refill on your cardiac medications before your next appointment, please call your pharmacy. --  Labwork: None ordered  Testing/Procedures: None ordered  Follow-Up: Remote monitoring is used to monitor your Pacemaker from home. This monitoring reduces the number of office visits required to check your device to one time per year. It allows [place] to keep an eye on the functioning of your device to ensure it is working properly. You are scheduled for a device check from home on 06/26/18 Justin Baker). You may send your transmission at any time that day. If you have a wireless device, the transmission will be sent automatically. After your physician reviews your transmission, you will receive a postcard with your next transmission date.   Your physician wants you to follow-up in: 1 year with  Justin Baker   You will receive a reminder letter in the mail two months in advance. If you don't receive a letter, please call our office to schedule the follow-up appointment.  Thank you for choosing [facility]!!    Any Other Special Instructions Will Be Listed Below (If Applicable).

## 2018-06-02 LAB — CUP PACEART INCLINIC DEVICE CHECK
Implantable Lead Implant Date: 20170322
Implantable Lead Implant Date: 20170322
Implantable Pulse Generator Implant Date: 20170322
MDC IDC LEAD LOCATION: 753859
MDC IDC LEAD LOCATION: 753860
MDC IDC PG SERIAL: 3134370
MDC IDC SESS DTM: 20191011080645
Pulse Gen Model: 2272

## 2018-06-26 ENCOUNTER — Telehealth: Payer: Self-pay

## 2018-06-26 ENCOUNTER — Ambulatory Visit (INDEPENDENT_AMBULATORY_CARE_PROVIDER_SITE_OTHER): Payer: Medicare Other | Admitting: *Deleted

## 2018-06-26 DIAGNOSIS — I495 Sick sinus syndrome: Secondary | ICD-10-CM | POA: Diagnosis not present

## 2018-06-26 NOTE — Telephone Encounter (Signed)
LMOVM reminding pt to send remote transmission.   

## 2018-06-27 NOTE — Progress Notes (Signed)
Remote pacemaker transmission.   

## 2018-06-29 ENCOUNTER — Encounter: Payer: Self-pay | Admitting: Cardiology

## 2018-07-17 ENCOUNTER — Other Ambulatory Visit: Payer: Self-pay | Admitting: *Deleted

## 2018-07-17 DIAGNOSIS — Z79899 Other long term (current) drug therapy: Secondary | ICD-10-CM

## 2018-07-17 DIAGNOSIS — E782 Mixed hyperlipidemia: Secondary | ICD-10-CM

## 2018-07-17 MED ORDER — ATORVASTATIN CALCIUM 40 MG PO TABS: 40.0000 mg | ORAL_TABLET | Freq: Every day | ORAL | 3 refills | Status: DC

## 2018-07-17 NOTE — Telephone Encounter (Signed)
Please advise on refill request as patient does not have a recent lipid panel in epic. Thanks, MI 

## 2018-07-17 NOTE — Telephone Encounter (Signed)
Left message for patient to call back  

## 2018-07-17 NOTE — Telephone Encounter (Signed)
Patient will come in on 07/27/18 for lab work.

## 2018-07-27 ENCOUNTER — Other Ambulatory Visit: Payer: Medicare Other | Admitting: *Deleted

## 2018-07-27 DIAGNOSIS — E782 Mixed hyperlipidemia: Secondary | ICD-10-CM

## 2018-07-27 DIAGNOSIS — Z79899 Other long term (current) drug therapy: Secondary | ICD-10-CM

## 2018-07-28 LAB — HEPATIC FUNCTION PANEL
ALT: 11 [IU]/L (ref 0–44)
AST: 17 [IU]/L (ref 0–40)
Albumin: 4.1 g/dL (ref 3.2–4.6)
Alkaline Phosphatase: 88 [IU]/L (ref 39–117)
Bilirubin Total: 1 mg/dL (ref 0.0–1.2)
Bilirubin, Direct: 0.31 mg/dL (ref 0.00–0.40)
Total Protein: 6 g/dL (ref 6.0–8.5)

## 2018-07-28 LAB — LIPID PANEL
Chol/HDL Ratio: 2.6 ratio (ref 0.0–5.0)
Cholesterol, Total: 92 mg/dL — ABNORMAL LOW (ref 100–199)
HDL: 35 mg/dL — ABNORMAL LOW (ref >39)
LDL Calculated: 40 mg/dL (ref 0–99)
Triglycerides: 86 mg/dL (ref 0–149)
VLDL Cholesterol Cal: 17 mg/dL (ref 5–40)

## 2018-08-25 LAB — CUP PACEART REMOTE DEVICE CHECK
Battery Remaining Longevity: 96 mo
Battery Remaining Percentage: 95.5 %
Battery Voltage: 2.98 V
Brady Statistic AP VP Percent: 95 %
Brady Statistic AP VS Percent: 1 %
Brady Statistic AS VS Percent: 1 %
Brady Statistic RV Percent Paced: 98 %
Date Time Interrogation Session: 20191105151410
Implantable Lead Implant Date: 20170322
Implantable Lead Implant Date: 20170322
Implantable Lead Location: 753860
Implantable Pulse Generator Implant Date: 20170322
Lead Channel Impedance Value: 400 Ohm
Lead Channel Impedance Value: 450 Ohm
Lead Channel Pacing Threshold Amplitude: 0.5 V
Lead Channel Pacing Threshold Amplitude: 0.75 V
Lead Channel Pacing Threshold Pulse Width: 0.5 ms
Lead Channel Pacing Threshold Pulse Width: 0.5 ms
Lead Channel Sensing Intrinsic Amplitude: 2.7 mV
Lead Channel Sensing Intrinsic Amplitude: 8.6 mV
Lead Channel Setting Pacing Amplitude: 2 V
Lead Channel Setting Pacing Amplitude: 2.5 V
Lead Channel Setting Pacing Pulse Width: 0.5 ms
Lead Channel Setting Sensing Sensitivity: 2 mV
MDC IDC LEAD LOCATION: 753859
MDC IDC STAT BRADY AS VP PERCENT: 4.1 %
MDC IDC STAT BRADY RA PERCENT PACED: 92 %
Pulse Gen Model: 2272
Pulse Gen Serial Number: 3134370

## 2018-09-25 ENCOUNTER — Ambulatory Visit (INDEPENDENT_AMBULATORY_CARE_PROVIDER_SITE_OTHER): Payer: Medicare HMO

## 2018-09-25 DIAGNOSIS — I495 Sick sinus syndrome: Secondary | ICD-10-CM

## 2018-09-27 LAB — CUP PACEART REMOTE DEVICE CHECK
Battery Remaining Longevity: 96 mo
Battery Remaining Percentage: 95.5 %
Battery Voltage: 2.98 V
Brady Statistic AP VP Percent: 96 %
Brady Statistic AS VP Percent: 2.8 %
Brady Statistic AS VS Percent: 1 %
Brady Statistic RA Percent Paced: 93 %
Brady Statistic RV Percent Paced: 98 %
Date Time Interrogation Session: 20200203075017
Implantable Lead Implant Date: 20170322
Implantable Lead Implant Date: 20170322
Implantable Lead Location: 753859
Implantable Pulse Generator Implant Date: 20170322
Lead Channel Impedance Value: 400 Ohm
Lead Channel Impedance Value: 450 Ohm
Lead Channel Pacing Threshold Amplitude: 0.75 V
Lead Channel Pacing Threshold Pulse Width: 0.5 ms
Lead Channel Pacing Threshold Pulse Width: 0.5 ms
Lead Channel Sensing Intrinsic Amplitude: 2.9 mV
Lead Channel Sensing Intrinsic Amplitude: 7.7 mV
Lead Channel Setting Pacing Amplitude: 2 V
Lead Channel Setting Pacing Amplitude: 2.5 V
Lead Channel Setting Pacing Pulse Width: 0.5 ms
Lead Channel Setting Sensing Sensitivity: 2 mV
MDC IDC LEAD LOCATION: 753860
MDC IDC MSMT LEADCHNL RV PACING THRESHOLD AMPLITUDE: 0.5 V
MDC IDC PG SERIAL: 3134370
MDC IDC STAT BRADY AP VS PERCENT: 1 %
Pulse Gen Model: 2272

## 2018-10-03 NOTE — Progress Notes (Signed)
Remote pacemaker transmission.   

## 2019-01-01 ENCOUNTER — Other Ambulatory Visit: Payer: Self-pay

## 2019-01-01 ENCOUNTER — Ambulatory Visit (INDEPENDENT_AMBULATORY_CARE_PROVIDER_SITE_OTHER): Payer: Medicare HMO | Admitting: *Deleted

## 2019-01-01 ENCOUNTER — Telehealth: Payer: Self-pay

## 2019-01-01 DIAGNOSIS — I480 Paroxysmal atrial fibrillation: Secondary | ICD-10-CM

## 2019-01-01 DIAGNOSIS — I495 Sick sinus syndrome: Secondary | ICD-10-CM

## 2019-01-01 NOTE — Telephone Encounter (Signed)
Spoke with patient to remind of missed remote transmission 

## 2019-01-02 LAB — CUP PACEART REMOTE DEVICE CHECK
Date Time Interrogation Session: 20200512101003
Implantable Lead Implant Date: 20170322
Implantable Lead Implant Date: 20170322
Implantable Lead Location: 753859
Implantable Lead Location: 753860
Implantable Pulse Generator Implant Date: 20170322
Pulse Gen Model: 2272
Pulse Gen Serial Number: 3134370

## 2019-01-08 NOTE — Progress Notes (Signed)
Remote pacemaker transmission.   

## 2019-01-29 ENCOUNTER — Other Ambulatory Visit: Payer: Self-pay | Admitting: *Deleted

## 2019-01-29 MED ORDER — APIXABAN 2.5 MG PO TABS: 2.5000 mg | ORAL_TABLET | Freq: Two times a day (BID) | ORAL | 3 refills | Status: DC

## 2019-01-29 NOTE — Telephone Encounter (Addendum)
Eliquis 2.5mg  paper refill request received from [facility]; pt is 85 yrs old, wt-90.4kg, Crea-1.50 on 09/09/2017, last seen by Chanetta Marshall on 06/01/2018 and has a recall for 03/30/2019 for Dr. Johnsie Cancel. Pt is at high risk for Covid-19 and being out is not permissible per patient as he cares for his wife who has alzheimer's. Placed a note on the recall to obtain labs if pt comes to an in office visit and send in a supply of Eliquis at this time. Pt was updated with this and states he verbalized understanding. He is aware he should be receiving a letter to schedule an appt with Dr. Johnsie Cancel soon per the recall information.

## 2019-04-03 ENCOUNTER — Ambulatory Visit (INDEPENDENT_AMBULATORY_CARE_PROVIDER_SITE_OTHER): Payer: Medicare HMO | Admitting: *Deleted

## 2019-04-03 DIAGNOSIS — I495 Sick sinus syndrome: Secondary | ICD-10-CM

## 2019-04-03 DIAGNOSIS — I4891 Unspecified atrial fibrillation: Secondary | ICD-10-CM

## 2019-04-05 LAB — CUP PACEART REMOTE DEVICE CHECK
Battery Remaining Longevity: 91 mo
Battery Remaining Percentage: 95.5 %
Battery Voltage: 2.96 V
Brady Statistic AP VP Percent: 95 %
Brady Statistic AP VS Percent: 1 %
Brady Statistic AS VP Percent: 2.4 %
Brady Statistic AS VS Percent: 1 %
Brady Statistic RA Percent Paced: 93 %
Brady Statistic RV Percent Paced: 97 %
Date Time Interrogation Session: 20200813051810
Implantable Lead Implant Date: 20170322
Implantable Lead Implant Date: 20170322
Implantable Lead Location: 753859
Implantable Lead Location: 753860
Implantable Pulse Generator Implant Date: 20170322
Lead Channel Impedance Value: 400 Ohm
Lead Channel Impedance Value: 450 Ohm
Lead Channel Sensing Intrinsic Amplitude: 10.1 mV
Lead Channel Sensing Intrinsic Amplitude: 2.5 mV
Lead Channel Setting Pacing Amplitude: 2 V
Lead Channel Setting Pacing Amplitude: 2.5 V
Lead Channel Setting Pacing Pulse Width: 0.5 ms
Lead Channel Setting Sensing Sensitivity: 2 mV
Pulse Gen Model: 2272
Pulse Gen Serial Number: 3134370

## 2019-04-12 NOTE — Progress Notes (Signed)
Remote pacemaker transmission.   

## 2019-04-16 ENCOUNTER — Other Ambulatory Visit: Payer: Self-pay | Admitting: Cardiovascular Disease

## 2019-04-16 MED ORDER — METOPROLOL TARTRATE 50 MG PO TABS: 50.0000 mg | ORAL_TABLET | Freq: Two times a day (BID) | ORAL | 0 refills | Status: DC

## 2019-05-28 ENCOUNTER — Other Ambulatory Visit: Payer: Self-pay | Admitting: *Deleted

## 2019-05-28 MED ORDER — APIXABAN 2.5 MG PO TABS: 2.5000 mg | ORAL_TABLET | Freq: Two times a day (BID) | ORAL | 0 refills | Status: DC

## 2019-05-28 NOTE — Addendum Note (Signed)
Addended by: Johny Shock B on: 05/28/2019 10:35 AM   Modules accepted: Orders

## 2019-05-28 NOTE — Telephone Encounter (Signed)
Prescription refill request for Eliquis received.  Last office visit: Justin Baker (06-01-2018) Scr: 1.50 (09-09-2017) Age: 85 yo Weight: 90.4kg  Pt is overdue for lab work, pt has an appointment with Dr. Johnsie Cancel on 06/08/2019 and on appointment notes it state to get CBC and Bmet. Will refill a 1 month supply.

## 2019-06-07 NOTE — Progress Notes (Signed)
Patient ID: Justin Baker, male   DOB: 05-06-1922, 85 y.o.   MRN: [id number]   06/08/2019 Kiante Ciavarella   04-May-1922  [id number]  Primary Physician Patient, No Pcp Per Primary Cardiologist: Dr. Johnsie Cancel   Reason for Visit/CC:  Pacer/ PAF   HPI: 85 y.o. with PAF first diagnosed in 2013. Initially poor risk for anticoagulation due to age and fall risk but had CVA at [facility] October 2016. TTE showed normal EF. Telemetry with PaF. Eventually settled on low dose Eliquis. CHADS2VASC 4   11/12/15 Had SJM Assurity MRI safe pacer implanted by Dr Caryl Comes for tachy brady syndrome Device normal function with no PAF Amiodarone d/c 2018 for decreased DLCO   Baseline Cr is around 2.0 and in future only Multaq may be option if AAT needed   Doing well with no palpitations or angina He has no primary care doctor   Current Outpatient Medications  Medication Sig Dispense Refill  . apixaban (ELIQUIS) 2.5 MG TABS tablet Take 1 tablet (2.5 mg total) by mouth 2 (two) times daily. 60 tablet 0  . atorvastatin (LIPITOR) 40 MG tablet Take 1 tablet (40 mg total) by mouth daily. 90 tablet 3  . metoprolol tartrate (LOPRESSOR) 50 MG tablet Take 1 tablet (50 mg total) by mouth 2 (two) times daily. Please keep upcoming appt in October with Dr. Johnsie Cancel for future refills. Thank you 180 tablet 0  . Misc Natural Products (OSTEO BI-FLEX ADV JOINT SHIELD PO) Take 2 tablets by mouth daily.     No current facility-administered medications for this visit.     No Known Allergies  Social History   Socioeconomic History  . Marital status: Married    Spouse name: Not on file  . Number of children: Not on file  . Years of education: Not on file  . Highest education level: Not on file  Occupational History  . Not on file  Social Needs  . Financial resource strain: Not on file  . Food insecurity    Worry: Not on file    Inability: Not on file  . Transportation needs    Medical: Not on file    Non-medical: Not on file   Tobacco Use  . Smoking status: Former Research scientist (life sciences)  . Smokeless tobacco: Former Systems developer    Quit date: 08/23/1976  Substance and Sexual Activity  . Alcohol use: No  . Drug use: No  . Sexual activity: Not on file  Lifestyle  . Physical activity    Days per week: Not on file    Minutes per session: Not on file  . Stress: Not on file  Relationships  . Social Herbalist on phone: Not on file    Gets together: Not on file    Attends religious service: Not on file    Active member of club or organization: Not on file    Attends meetings of clubs or organizations: Not on file    Relationship status: Not on file  . Intimate partner violence    Fear of current or ex partner: Not on file    Emotionally abused: Not on file    Physically abused: Not on file    Forced sexual activity: Not on file  Other Topics Concern  . Not on file  Social History Narrative  . Not on file     Review of Systems: General: negative for chills, fever, night sweats or weight changes.  Cardiovascular: negative for chest pain, dyspnea on exertion, edema, orthopnea,  palpitations, paroxysmal nocturnal dyspnea or shortness of breath Dermatological: negative for rash Respiratory: negative for cough or wheezing Urologic: negative for hematuria Abdominal: negative for nausea, vomiting, diarrhea, bright red blood per rectum, melena, or hematemesis Neurologic: negative for visual changes, syncope, or dizziness All other systems reviewed and are otherwise negative except as noted above.    Blood pressure 128/60, pulse 65, height 5\' 9"  (1.753 m), weight 193 lb (87.5 kg), SpO2 99 %.   Affect appropriate Chronically ill male  HEENT: normal Neck supple with no adenopathy JVP normal no bruits no thyromegaly Lungs clear with no wheezing and good diaphragmatic motion Heart:  S1/S2 no murmur, no rub, gallop or click PMI normal pacer under left clavicle  Abdomen: benighn, BS positve, no tenderness, no AAA no bruit.   No HSM or HJR Distal pulses intact with no bruits No edema Neuro lest dysarthria improved  Skin warm and dry No muscular weakness   EKG  06/16/15   sinus bradycardia. 45 bpm. 1st degree AVB.  09/02/16 AV pacing rate 56 03/30/18 AV pacing rate 63 no changes 06/08/19 AV pacing rate 65   ASSESSMENT AND PLAN:   1. PAF: currently in SR  Tolerating elquis with no bleeding issues Amiodarone d/c due to abnormal DLCO in 2018  If recurs only alternative would be Multaq.    2. CVA: speech is improved but not back to 100%. Continue low dose eliquis and statin   3. Pacer:  Normal function rate response adjusted but still with some fatigue f/u EP St Jude device implant 2017  4. HLD:  On statin  LDL 40 great  He has no primary care doctor he had reaction to high dose flu shot in past but willing to get low dose one. Also needs lab work will check CMP, CBC and PSA/TSH   PLAN  F/u with me in a year    2018  06/08/2019 3:46 PM

## 2019-06-08 ENCOUNTER — Ambulatory Visit: Payer: Medicare HMO | Admitting: Cardiovascular Disease

## 2019-06-08 ENCOUNTER — Encounter: Payer: Self-pay | Admitting: Cardiovascular Disease

## 2019-06-08 ENCOUNTER — Other Ambulatory Visit: Payer: Self-pay

## 2019-06-08 VITALS — BP 128/60 | HR 65 | Ht 69.0 in | Wt 193.0 lb

## 2019-06-08 DIAGNOSIS — I480 Paroxysmal atrial fibrillation: Secondary | ICD-10-CM

## 2019-06-08 DIAGNOSIS — Z23 Encounter for immunization: Secondary | ICD-10-CM

## 2019-06-08 DIAGNOSIS — R350 Frequency of micturition: Secondary | ICD-10-CM

## 2019-06-08 NOTE — Patient Instructions (Addendum)
Medication Instructions:   *If you need a refill on your cardiac medications before your next appointment, please call your pharmacy*  Lab Work: Your physician recommends that you have lab work today- CMET, CBC, PSA, and TSH  If you have labs (blood work) drawn today and your tests are completely normal, you will receive your results only by: Marland Kitchen MyChart Message (if you have MyChart) OR . A paper copy in the mail If you have any lab test that is abnormal or we need to change your treatment, we will call you to review the results.  Testing/Procedures: None ordered today.   Follow-Up: At [facility], you and your health needs are our priority.  As part of our continuing mission to provide you with exceptional heart care, we have created designated Provider Care Teams.  These Care Teams include your primary Cardiologist (physician) and Advanced Practice Providers (APPs -  Physician Assistants and Nurse Practitioners) who all work together to provide you with the care you need, when you need it.  Your next appointment:   6 months  The format for your next appointment:   In Person  Provider:   You may see Dr. Alvan Dame or one of the following Advanced Practice Providers on your designated Care Team:    Truitt Merle, NP  Cecilie Kicks, NP  Kathyrn Drown, NP

## 2019-06-09 LAB — CBC WITH DIFFERENTIAL/PLATELET
Basophils Absolute: 0.1 10*3/uL (ref 0.0–0.2)
Basos: 1 %
EOS (ABSOLUTE): 0.2 10*3/uL (ref 0.0–0.4)
Eos: 2 %
Hematocrit: 42.3 % (ref 37.5–51.0)
Hemoglobin: 14.4 g/dL (ref 13.0–17.7)
Immature Grans (Abs): 0.1 10*3/uL (ref 0.0–0.1)
Immature Granulocytes: 1 %
Lymphocytes Absolute: 2.8 10*3/uL (ref 0.7–3.1)
Lymphs: 26 %
MCH: 32.8 pg (ref 26.6–33.0)
MCHC: 34 g/dL (ref 31.5–35.7)
MCV: 96 fL (ref 79–97)
Monocytes Absolute: 0.7 10*3/uL (ref 0.1–0.9)
Monocytes: 7 %
Neutrophils Absolute: 6.9 10*3/uL (ref 1.4–7.0)
Neutrophils: 63 %
Platelets: 146 10*3/uL — ABNORMAL LOW (ref 150–450)
RBC: 4.39 x10E6/uL (ref 4.14–5.80)
RDW: 12 % (ref 11.6–15.4)
WBC: 10.7 10*3/uL (ref 3.4–10.8)

## 2019-06-09 LAB — TSH: TSH: 1.43 u[IU]/mL (ref 0.450–4.500)

## 2019-06-09 LAB — COMPREHENSIVE METABOLIC PANEL
ALT: 10 IU/L (ref 0–44)
AST: 17 IU/L (ref 0–40)
Albumin/Globulin Ratio: 1.6 (ref 1.2–2.2)
Albumin: 3.9 g/dL (ref 3.5–4.6)
Alkaline Phosphatase: 92 IU/L (ref 39–117)
BUN/Creatinine Ratio: 16 (ref 10–24)
BUN: 24 mg/dL (ref 10–36)
Bilirubin Total: 1 mg/dL (ref 0.0–1.2)
CO2: 22 mmol/L (ref 20–29)
Calcium: 9.5 mg/dL (ref 8.6–10.2)
Chloride: 110 mmol/L — ABNORMAL HIGH (ref 96–106)
Creatinine, Ser: 1.47 mg/dL — ABNORMAL HIGH (ref 0.76–1.27)
GFR calc Af Amer: 46 mL/min/{1.73_m2} — ABNORMAL LOW (ref >59)
GFR calc non Af Amer: 40 mL/min/{1.73_m2} — ABNORMAL LOW (ref >59)
Globulin, Total: 2.4 g/dL (ref 1.5–4.5)
Glucose: 99 mg/dL (ref 65–99)
Potassium: 4 mmol/L (ref 3.5–5.2)
Sodium: 145 mmol/L — ABNORMAL HIGH (ref 134–144)
Total Protein: 6.3 g/dL (ref 6.0–8.5)

## 2019-06-09 LAB — PSA: Prostate Specific Ag, Serum: 0.5 ng/mL (ref 0.0–4.0)

## 2019-06-11 ENCOUNTER — Telehealth: Payer: Self-pay

## 2019-06-11 DIAGNOSIS — R899 Unspecified abnormal finding in specimens from other organs, systems and tissues: Secondary | ICD-10-CM

## 2019-06-11 DIAGNOSIS — R7989 Other specified abnormal findings of blood chemistry: Secondary | ICD-10-CM

## 2019-06-11 NOTE — Telephone Encounter (Signed)
-----   Message from Josue Hector, MD sent at 06/11/2019  8:08 AM EDT ----- Labs ok chronic mild renal failure and low plt's f/u primary

## 2019-06-11 NOTE — Telephone Encounter (Signed)
Patient aware of lab work. Informed patient that Dr. Johnsie Cancel will check his lab work again in 6 months and would like for him to find a PCP in the mean time. Patient stated he is not going to find a PCP and he will have his lab work done at his next appointment with Dr. Johnsie Cancel. Patient does not want to have multiple trips to the office.

## 2019-06-11 NOTE — Telephone Encounter (Signed)
Patient aware of lab work. Informed patient that Dr. Nishan will check his lab work again in 6 months and would like for him to find a PCP in the mean time. Patient stated he is not going to find a PCP and he will have his lab work done at his next appointment with Dr. Nishan. Patient does not want to have multiple trips to the office.  

## 2019-07-01 ENCOUNTER — Other Ambulatory Visit: Payer: Self-pay | Admitting: Cardiovascular Disease

## 2019-07-02 NOTE — Telephone Encounter (Signed)
Pt last saw Dr Johnsie Cancel 06/08/19, last labs 06/08/19 Creat 1.47, age 85, weight 87.5kg, based on specified criteria and histrically Creat runs 1.5 or greater, will keep pt on Eliquis 2.5mg  BID.  Will refill rx.

## 2019-07-04 ENCOUNTER — Ambulatory Visit (INDEPENDENT_AMBULATORY_CARE_PROVIDER_SITE_OTHER): Payer: Medicare HMO | Admitting: *Deleted

## 2019-07-04 DIAGNOSIS — I495 Sick sinus syndrome: Secondary | ICD-10-CM | POA: Diagnosis not present

## 2019-07-04 DIAGNOSIS — I4891 Unspecified atrial fibrillation: Secondary | ICD-10-CM

## 2019-07-05 LAB — CUP PACEART REMOTE DEVICE CHECK
Battery Remaining Longevity: 92 mo
Battery Remaining Percentage: 95.5 %
Battery Voltage: 2.96 V
Brady Statistic AP VP Percent: 96 %
Brady Statistic AP VS Percent: 1 %
Brady Statistic AS VP Percent: 2.2 %
Brady Statistic AS VS Percent: 1 %
Brady Statistic RA Percent Paced: 93 %
Brady Statistic RV Percent Paced: 97 %
Date Time Interrogation Session: 20201112075032
Implantable Lead Implant Date: 20170322
Implantable Lead Implant Date: 20170322
Implantable Lead Location: 753859
Implantable Lead Location: 753860
Implantable Pulse Generator Implant Date: 20170322
Lead Channel Impedance Value: 410 Ohm
Lead Channel Impedance Value: 450 Ohm
Lead Channel Sensing Intrinsic Amplitude: 1.7 mV
Lead Channel Sensing Intrinsic Amplitude: 7.5 mV
Lead Channel Setting Pacing Amplitude: 2 V
Lead Channel Setting Pacing Amplitude: 2.5 V
Lead Channel Setting Pacing Pulse Width: 0.5 ms
Lead Channel Setting Sensing Sensitivity: 2 mV
Pulse Gen Model: 2272
Pulse Gen Serial Number: 3134370

## 2019-07-08 ENCOUNTER — Other Ambulatory Visit: Payer: Self-pay | Admitting: Cardiovascular Disease

## 2019-07-16 ENCOUNTER — Other Ambulatory Visit: Payer: Self-pay | Admitting: Cardiovascular Disease

## 2019-07-25 NOTE — Progress Notes (Signed)
Remote pacemaker transmission.   

## 2019-09-27 ENCOUNTER — Other Ambulatory Visit: Payer: Self-pay

## 2019-09-27 ENCOUNTER — Ambulatory Visit: Payer: Medicare HMO | Admitting: Student

## 2019-09-27 ENCOUNTER — Encounter: Payer: Self-pay | Admitting: Student

## 2019-09-27 VITALS — BP 126/64 | HR 67 | Ht 69.0 in | Wt 192.4 lb

## 2019-09-27 DIAGNOSIS — I480 Paroxysmal atrial fibrillation: Secondary | ICD-10-CM

## 2019-09-27 DIAGNOSIS — I495 Sick sinus syndrome: Secondary | ICD-10-CM

## 2019-09-27 LAB — CUP PACEART INCLINIC DEVICE CHECK
Battery Remaining Longevity: 86 mo
Battery Voltage: 2.96 V
Brady Statistic RA Percent Paced: 94 %
Brady Statistic RV Percent Paced: 97 %
Date Time Interrogation Session: 20210204143113
Implantable Lead Implant Date: 20170321200000
Implantable Lead Implant Date: 20170321200000
Implantable Lead Location: 753859
Implantable Lead Location: 753860
Implantable Pulse Generator Implant Date: 20170321200000
Lead Channel Impedance Value: 400 Ohm
Lead Channel Impedance Value: 462.5 Ohm
Lead Channel Pacing Threshold Amplitude: 0.75 V
Lead Channel Pacing Threshold Amplitude: 0.75 V
Lead Channel Pacing Threshold Amplitude: 0.75 V
Lead Channel Pacing Threshold Amplitude: 0.75 V
Lead Channel Pacing Threshold Pulse Width: 0.5 ms
Lead Channel Pacing Threshold Pulse Width: 0.5 ms
Lead Channel Pacing Threshold Pulse Width: 0.5 ms
Lead Channel Pacing Threshold Pulse Width: 0.5 ms
Lead Channel Sensing Intrinsic Amplitude: 2.9 mV
Lead Channel Sensing Intrinsic Amplitude: 5.5 mV
Lead Channel Setting Pacing Amplitude: 2 V
Lead Channel Setting Pacing Amplitude: 2.5 V
Lead Channel Setting Pacing Pulse Width: 0.5 ms
Lead Channel Setting Sensing Sensitivity: 2 mV
Pulse Gen Model: 2272
Pulse Gen Serial Number: 3134370

## 2019-09-27 NOTE — Progress Notes (Signed)
Electrophysiology Office Note Date: 09/27/2019  ID:  Justin Baker, DOB 1921-09-02, MRN [id number]  Primary Cardiologist: Dr. Johnsie Cancel Electrophysiologist: Dr. Rayann Heman   CC: Pacemaker follow-up  Justin Baker is a 86 y.o. male seen today for Dr. Rayann Heman . he presents today for routine electrophysiology followup.  Since last being seen in our clinic, the patient reports doing very well. He gets around and does whatever he needs to do. He is the primary caregiver for his wife with Alzheimers, and their son lives with and helps as well.  he denies chest pain, palpitations, dyspnea, PND, orthopnea, nausea, vomiting, dizziness, syncope, edema, weight gain, or early satiety. No bleeding on Eliquis.  Device History: St. Jude Dual Chamber PPM implanted 2017 for tachy/brady  Past Medical History:  Diagnosis Date  . Atrial fibrillation ([place])   . Back pain   . COPD (chronic obstructive pulmonary disease) ([place])    self diagnosed  . Osteoarthritis    s/p right total hip  . Shoulder pain, bilateral   . Stroke ([place])   . Tachy-brady syndrome ([facility])    a. s/p STJ dual chamber PPM   . Thrombocytopenia ([place])    Past Surgical History:  Procedure Laterality Date  . CARDIOVERSION  05/17/2012   Procedure: CARDIOVERSION;  Surgeon: Jolaine Artist, MD;  Location: [facility] ENDOSCOPY;  Service: Cardiovascular;  Laterality: [name];  . CATARACT EXTRACTION W/ INTRAOCULAR LENS IMPLANT     right  . EP IMPLANTABLE DEVICE [name] 11/12/2015   SJM Assurity MRI DR PPM implanted by Dr Caryl Comes for tachy/brady syndrome  . TEE WITHOUT CARDIOVERSION  05/17/2012   Procedure: TRANSESOPHAGEAL ECHOCARDIOGRAM (TEE);  Surgeon: Jolaine Artist, MD;  Location[facility] ENDOSCOPY;  Service: Cardiovascular;  Laterality: [name];  . TOTAL HIP ARTHROPLASTY     right    Current Outpatient Medications  Medication Sig Dispense Refill  . atorvastatin (LIPITOR) 40 MG tablet TAKE 1 TABLET BY MOUTH DAILY 90 tablet 3  . ELIQUIS 2.5 MG TABS tablet TAKE ONE  TABLET BY MOUTH TWICE DAILY 60 tablet 5  . metoprolol tartrate (LOPRESSOR) 50 MG tablet TAKE ONE TABLET BY MOUTH TWICE DAILY 180 tablet 2  . Misc Natural Products (OSTEO BI-FLEX ADV JOINT SHIELD PO) Take 2 tablets by mouth daily.     No current facility-administered medications for this visit.    Allergies:   Patient has no known allergies.   Social History: Social History   Socioeconomic History  . Marital status: Married    Spouse name: Not on file  . Number of children: Not on file  . Years of education: Not on file  . Highest education level: Not on file  Occupational History  . Not on file  Tobacco Use  . Smoking status: Former Research scientist (life sciences)  . Smokeless tobacco: Former Systems developer    Quit date: 08/23/1976  Substance and Sexual Activity  . Alcohol use: No  . Drug use: No  . Sexual activity: Not on file  Other Topics Concern  . Not on file  Social History Narrative  . Not on file   Social Determinants of Health   Financial Resource Strain:   . Difficulty of Paying Living Expenses: Not on file  Food Insecurity:   . Worried About Charity fundraiser in the Last Year: Not on file  . Ran Out of Food in the Last Year: Not on file  Transportation Needs:   . Lack of Transportation (Medical): Not on file  . Lack of Transportation (Non-Medical): Not on  file  Physical Activity:   . Days of Exercise per Week: Not on file  . Minutes of Exercise per Session: Not on file  Stress:   . Feeling of Stress : Not on file  Social Connections:   . Frequency of Communication with Friends and Family: Not on file  . Frequency of Social Gatherings with Friends and Family: Not on file  . Attends Religious Services: Not on file  . Active Member of Clubs or Organizations: Not on file  . Attends Banker Meetings: Not on file  . Marital Status: Not on file  Intimate Partner Violence:   . Fear of Current or Ex-Partner: Not on file  . Emotionally Abused: Not on file  . Physically Abused:  Not on file  . Sexually Abused: Not on file    Family History: Family History  Problem Relation Age of Onset  . Tuberculosis Father   . Other Unknown        No known heart disease     Review of Systems: All other systems reviewed and are otherwise negative except as noted above.  Physical Exam: Vitals:   09/27/19 1143  BP: 126/64  Pulse: 67  SpO2: 100%  Weight: 192 lb 6.4 oz (87.3 kg)  Height: 5\' 9"  (1.753 m)     GEN- The patient is well appearing, alert and oriented x 3 today.   HEENT: normocephalic, atraumatic; sclera clear, conjunctiva pink; hearing intact; oropharynx clear; neck supple  Lungs- Clear to ausculation bilaterally, normal work of breathing.  No wheezes, rales, rhonchi Heart- Regular rate and rhythm, no murmurs, rubs or gallops  GI- soft, non-tender, non-distended, bowel sounds present  Extremities- no clubbing, cyanosis, or edema  MS- no significant deformity or atrophy Skin- warm and dry, no rash or lesion; PPM pocket well healed Psych- euthymic mood, full affect Neuro- strength and sensation are intact  PPM Interrogation- reviewed in detail today,  See PACEART report  EKG:  EKG is not ordered today. The ekg ordered 06/08/2019 AV dual paced at 65 bpm  Recent Labs: 06/08/2019: ALT 10; BUN 24; Creatinine, Ser 1.47; Hemoglobin 14.4; Platelets 146; Potassium 4.0; Sodium 145; TSH 1.430   Wt Readings from Last 3 Encounters:  09/27/19 192 lb 6.4 oz (87.3 kg)  06/08/19 193 lb (87.5 kg)  06/01/18 199 lb 6.4 oz (90.4 kg)     Other studies Reviewed: Additional studies/ records that were reviewed today include: Previous EP office notes, Previous remote checks, Most recent labwork.   Assessment and Plan:  1. Symptomatic bradycardia due to  tachy/brady syndrome s/p St. Jude PPM  Normal PPM function See Pace Art report No changes today  2. Paroxysmal atrial fibrillation Maintaining SR off amiodarone (stopped 2/2 abnormal PFTs).  He has occasional Short  runs of AF, but overall burden minimal (~1%) If recurrence AAD options are limited to Multaq. Continue appropriately dosed Eliquis for CHA2DS2VASC of at least 4    Current medicines are reviewed at length with the patient today.   The patient does not have concerns regarding his medicines.  The following changes were made today:  none  Disposition:   Follow up with EP APP in 12 months.   08/01/18, PA-C  09/27/2019 11:54 AM  [facility] [address] [place] [phone] (office) [phone] (fax)

## 2019-09-27 NOTE — Patient Instructions (Addendum)
Medication Instructions:  none *If you need a refill on your cardiac medications before your next appointment, please call your pharmacy*  Lab Work: none If you have labs (blood work) drawn today and your tests are completely normal, you will receive your results only by: Marland Kitchen MyChart Message (if you have MyChart) OR . A paper copy in the mail If you have any lab test that is abnormal or we need to change your treatment, we will call you to review the results.  Testing/Procedures: none  Follow-Up: At [facility], you and your health needs are our priority.  As part of our continuing mission to provide you with exceptional heart care, we have created designated Provider Care Teams.  These Care Teams include your primary Cardiologist (physician) and Advanced Practice Providers (APPs -  Physician Assistants and Nurse Practitioners) who all work together to provide you with the care you need, when you need it.  Your next appointment:   1 year(s)  The format for your next appointment:   Either In Person or Virtual  Provider:   Otilio Saber, PA  Other Instructions Remote monitoring is used to monitor your Pacemaker  from home. This monitoring reduces the number of office visits required to check your device to one time per year. It allows [place] to keep an eye on the functioning of your device to ensure it is working properly. You are scheduled for a device check from home on 10/03/19. You may send your transmission at any time that day. If you have a wireless device, the transmission will be sent automatically. After your physician reviews your transmission, you will receive a postcard with your next transmission date.

## 2019-10-03 ENCOUNTER — Ambulatory Visit (INDEPENDENT_AMBULATORY_CARE_PROVIDER_SITE_OTHER): Payer: Medicare HMO | Admitting: *Deleted

## 2019-10-03 DIAGNOSIS — I495 Sick sinus syndrome: Secondary | ICD-10-CM | POA: Diagnosis not present

## 2019-10-04 LAB — CUP PACEART REMOTE DEVICE CHECK
Battery Remaining Longevity: 92 mo
Battery Remaining Percentage: 95.5 %
Battery Voltage: 2.96 V
Brady Statistic AP VP Percent: 97 %
Brady Statistic AP VS Percent: 1 %
Brady Statistic AS VP Percent: 1 %
Brady Statistic AS VS Percent: 1 %
Brady Statistic RA Percent Paced: 96 %
Brady Statistic RV Percent Paced: 98 %
Date Time Interrogation Session: 20210211110259
Implantable Lead Implant Date: 20170322
Implantable Lead Implant Date: 20170322
Implantable Lead Location: 753859
Implantable Lead Location: 753860
Implantable Pulse Generator Implant Date: 20170322
Lead Channel Impedance Value: 400 Ohm
Lead Channel Impedance Value: 450 Ohm
Lead Channel Pacing Threshold Amplitude: 0.75 V
Lead Channel Pacing Threshold Amplitude: 0.75 V
Lead Channel Pacing Threshold Pulse Width: 0.5 ms
Lead Channel Pacing Threshold Pulse Width: 0.5 ms
Lead Channel Sensing Intrinsic Amplitude: 1.5 mV
Lead Channel Sensing Intrinsic Amplitude: 5.5 mV
Lead Channel Setting Pacing Amplitude: 2 V
Lead Channel Setting Pacing Amplitude: 2.5 V
Lead Channel Setting Pacing Pulse Width: 0.5 ms
Lead Channel Setting Sensing Sensitivity: 2 mV
Pulse Gen Model: 2272
Pulse Gen Serial Number: 3134370

## 2019-10-04 NOTE — Progress Notes (Signed)
PPM Remote  

## 2019-10-29 DIAGNOSIS — Z7901 Long term (current) use of anticoagulants: Secondary | ICD-10-CM | POA: Insufficient documentation

## 2019-10-29 DIAGNOSIS — I609 Nontraumatic subarachnoid hemorrhage, unspecified: Secondary | ICD-10-CM | POA: Insufficient documentation

## 2019-10-31 NOTE — Progress Notes (Deleted)
Patient ID: Justin Baker, male   DOB: [birth year], 86 y.o.   MRN: [id number]   10/31/2019 Justin Baker   11-05-1921  [id number]  Primary Physician Patient, No Pcp Per Primary Cardiologist: Dr. Johnsie Cancel   Reason for Visit/CC:  Pacer/ PAF   HPI: 86 y.o. with PAF first diagnosed in 2013. Initially poor risk for anticoagulation due to age and fall risk but had CVA at [facility] October 2016. TTE showed normal EF. Telemetry with PaF. Eventually settled on low dose Eliquis. CHADS2VASC 4   11/12/15 Had SJM Assurity MRI safe pacer implanted by Dr Caryl Comes for tachy brady syndrome Device normal function with no PAF Amiodarone d/c 2018 for decreased DLCO   Baseline Cr is around 2.0 and in future only Multaq may be option if AAT needed  Seen at [facility] earlier this month for traumatic frontal lobe sulcal subarachnoid hemorrhage no mass effect or hydrocephalus he was leaning over to get something off floor and fell   ***  Current Outpatient Medications  Medication Sig Dispense Refill  . atorvastatin (LIPITOR) 40 MG tablet TAKE 1 TABLET BY MOUTH DAILY 90 tablet 3  . ELIQUIS 2.5 MG TABS tablet TAKE ONE TABLET BY MOUTH TWICE DAILY 60 tablet 5  . metoprolol tartrate (LOPRESSOR) 50 MG tablet TAKE ONE TABLET BY MOUTH TWICE DAILY 180 tablet 2  . Misc Natural Products (OSTEO BI-FLEX ADV JOINT SHIELD PO) Take 2 tablets by mouth daily.     No current facility-administered medications for this visit.    No Known Allergies  Social History   Socioeconomic History  . Marital status: Married    Spouse name: Not on file  . Number of children: Not on file  . Years of education: Not on file  . Highest education level: Not on file  Occupational History  . Not on file  Tobacco Use  . Smoking status: Former Research scientist (life sciences)  . Smokeless tobacco: Former Systems developer    Quit date: 08/23/1976  Substance and Sexual Activity  . Alcohol use: No  . Drug use: No  . Sexual activity: Not on file  Other Topics Concern  . Not on file    Social History Narrative  . Not on file   Social Determinants of Health   Financial Resource Strain:   . Difficulty of Paying Living Expenses: Not on file  Food Insecurity:   . Worried About Charity fundraiser in the Last Year: Not on file  . Ran Out of Food in the Last Year: Not on file  Transportation Needs:   . Lack of Transportation (Medical): Not on file  . Lack of Transportation (Non-Medical): Not on file  Physical Activity:   . Days of Exercise per Week: Not on file  . Minutes of Exercise per Session: Not on file  Stress:   . Feeling of Stress : Not on file  Social Connections:   . Frequency of Communication with Friends and Family: Not on file  . Frequency of Social Gatherings with Friends and Family: Not on file  . Attends Religious Services: Not on file  . Active Member of Clubs or Organizations: Not on file  . Attends Archivist Meetings: Not on file  . Marital Status: Not on file  Intimate Partner Violence:   . Fear of Current or Ex-Partner: Not on file  . Emotionally Abused: Not on file  . Physically Abused: Not on file  . Sexually Abused: Not on file     Review of Systems: General: negative  for chills, fever, night sweats or weight changes.  Cardiovascular: negative for chest pain, dyspnea on exertion, edema, orthopnea, palpitations, paroxysmal nocturnal dyspnea or shortness of breath Dermatological: negative for rash Respiratory: negative for cough or wheezing Urologic: negative for hematuria Abdominal: negative for nausea, vomiting, diarrhea, bright red blood per rectum, melena, or hematemesis Neurologic: negative for visual changes, syncope, or dizziness All other systems reviewed and are otherwise negative except as noted above.    There were no vitals taken for this visit.   Affect appropriate Chronically ill male  HEENT: normal Neck supple with no adenopathy JVP normal no bruits no thyromegaly Lungs clear with no wheezing and good  diaphragmatic motion Heart:  S1/S2 no murmur, no rub, gallop or click PMI normal pacer under left clavicle  Abdomen: benighn, BS positve, no tenderness, no AAA no bruit.  No HSM or HJR Distal pulses intact with no bruits No edema Neuro lest dysarthria improved  Skin warm and dry No muscular weakness   EKG  06/16/15   sinus bradycardia. 45 bpm. 1st degree AVB.  09/02/16 AV pacing rate 56 03/30/18 AV pacing rate 63 no changes 06/08/19 AV pacing rate 65   ASSESSMENT AND PLAN:   1. PAF: currently in SR  With AV pacing  Amiodarone d/c due to abnormal DLCO in 2018 ***  2. CVA/SAH: with recent fall and SAH on low dose eliquis ***  3. Pacer:  Normal function rate response adjusted but still with some fatigue f/u EP St Jude device implant 2017  4. HLD:  On statin  LDL 40 great  ***   PLAN  F/u with me in a year    [name]  10/31/2019 11:12 AM

## 2019-11-05 ENCOUNTER — Other Ambulatory Visit: Payer: Self-pay

## 2019-11-05 ENCOUNTER — Telehealth: Payer: Self-pay | Admitting: Cardiovascular Disease

## 2019-11-05 ENCOUNTER — Ambulatory Visit: Payer: Medicare HMO | Admitting: Nurse Practitioner

## 2019-11-05 ENCOUNTER — Encounter: Payer: Self-pay | Admitting: Nurse Practitioner

## 2019-11-05 VITALS — BP 128/72 | HR 64 | Ht 69.0 in | Wt 189.5 lb

## 2019-11-05 DIAGNOSIS — I609 Nontraumatic subarachnoid hemorrhage, unspecified: Secondary | ICD-10-CM

## 2019-11-05 DIAGNOSIS — Z7901 Long term (current) use of anticoagulants: Secondary | ICD-10-CM | POA: Diagnosis not present

## 2019-11-05 DIAGNOSIS — Z79899 Other long term (current) drug therapy: Secondary | ICD-10-CM

## 2019-11-05 DIAGNOSIS — I480 Paroxysmal atrial fibrillation: Secondary | ICD-10-CM

## 2019-11-05 DIAGNOSIS — Z7189 Other specified counseling: Secondary | ICD-10-CM

## 2019-11-05 NOTE — Progress Notes (Addendum)
CARDIOLOGY OFFICE NOTE  Date:  11/05/2019    Justin Baker Date of Birth: 02-02-1922 Medical Record #[id number]  PCP:  Patient, No Pcp Per  Cardiologist:  Eden Emms   Chief Complaint  Patient presents with  . Hospitalization Follow-up    Post hospital visit - seen for Dr. Eden Emms    History of Present Illness: Justin Baker is a 86 y.o. male who presents today for a work in/post hospital visit visit. Seen for Dr. Eden Emms.   He has a history of PAF - found in 2013 - felt to initially be poor candidate for anticoagulation but then had stroke in 2016. Has had normal EF by echo. He has been on Eliquis. Also with underlying PPM for tachy brady - sees Dr. Graciela Husbands - previously on Amiodarone but stopped due to decreased DLCO. Other issues include advanced age and CKD.   Last seen by Dr. Eden Emms in October. He saw Mardelle Matte last month for device check.   Noted recent admission to [facility] last week after a fall in the bathroom - he blocked the door - was "out" and subsequent SAH - anticoagulation has been stopped - there was noted expansion on an 8 hour repeat scan but "stable". He was seen by neurosurgery - no surgical intervention required - for outpatient follow up scans. Noted to have incidental thyroid nodule - to see ENT. He and his son care for the patient's wife - noted that the son also has chronic medical issues. He is a limited DNR. He was to hold his Eliquis for at least 4 weeks but advised to have discussion regarding overall use of Eliquis/anticoagulation.   The patient does not have symptoms concerning for COVID-19 infection (fever, chills, cough, or new shortness of breath).   Comes in today. Here with another son - he notes the patient did not have his shoes on - was on tile floor - did have his walker - this fall was not witnessed and he does not really remember the events. He fell on his right side. He has extensive bruising. They are asking about resuming Eliquis. He has no chest pain. They  are asking about the ENT referral and the lung nodule follow up. Tells me that Dr. Eden Emms does his primary care.   Past Medical History:  Diagnosis Date  . Atrial fibrillation ([facility])   . Back pain   . COPD (chronic obstructive pulmonary disease) ([facility])    self diagnosed  . Osteoarthritis    s/p right total hip  . Shoulder pain, bilateral   . Stroke ([facility])   . Tachy-brady syndrome ([facility])    a. s/p STJ dual chamber PPM   . Thrombocytopenia ([facility])     Past Surgical History:  Procedure Laterality Date  . CARDIOVERSION  05/17/2012   Procedure: CARDIOVERSION;  Surgeon: Dolores Patty, MD;  Location: [facility] ENDOSCOPY;  Service: Cardiovascular;  Laterality: [name];  . CATARACT EXTRACTION W/ INTRAOCULAR LENS IMPLANT     right  . EP IMPLANTABLE DEVICE [name] 11/12/2015   SJM Assurity MRI DR PPM implanted by Dr Graciela Husbands for tachy/brady syndrome  . TEE WITHOUT CARDIOVERSION  05/17/2012   Procedure: TRANSESOPHAGEAL ECHOCARDIOGRAM (TEE);  Surgeon: Dolores Patty, MD;  Location: [facility] ENDOSCOPY;  Service: Cardiovascular;  Laterality: [name];  . TOTAL HIP ARTHROPLASTY     right     Medications: Current Meds  Medication Sig  . atorvastatin (LIPITOR) 40 MG tablet TAKE 1 TABLET BY MOUTH DAILY  . metoprolol tartrate (LOPRESSOR) 50  MG tablet TAKE ONE TABLET BY MOUTH TWICE DAILY  . Misc Natural Products (OSTEO BI-FLEX ADV JOINT SHIELD PO) Take 2 tablets by mouth daily.  . [DISCONTINUED] ELIQUIS 2.5 MG TABS tablet TAKE ONE TABLET BY MOUTH TWICE DAILY     Allergies: No Known Allergies  Social History: The patient  reports that he has quit smoking. He quit smokeless tobacco use about 43 years ago. He reports that he does not drink alcohol or use drugs.   Family History: The patient's family history includes Other in his unknown relative; Tuberculosis in his father.   Review of Systems: Please see the history of present illness.   All other systems are reviewed and negative.   Physical Exam: VS:  BP 128/72    Pulse 64   Ht 5\' 9"  (1.753 m)   Wt 189 lb 8 oz (86 kg)   SpO2 98%   BMI 27.98 kg/m  .  BMI Body mass index is 27.98 kg/m.  Wt Readings from Last 3 Encounters:  11/05/19 189 lb 8 oz (86 kg)  09/27/19 192 lb 6.4 oz (87.3 kg)  06/08/19 193 lb (87.5 kg)    General: 86 years old. He looks younger than his stated agel. Alert and in no acute distress.  He is in a wheelchair. Has a right black eye. He has bruising down the left side of his face too. Extensive bruising down right hip.   Cardiac: Fairly regular.  No edema.  Respiratory:  Lungs are clear to auscultation bilaterally with normal work of breathing.  GI: Soft and nontender.  MS: No deformity or atrophy. Gait not tested.   Skin: Warm and dry. Color is normal.  Neuro:  Strength and sensation are intact and no gross focal deficits noted.  Psych: Alert, appropriate and with normal affect.   LABORATORY DATA:  EKG:  EKG is not ordered today.   Lab Results  Component Value Date   WBC 10.7 06/08/2019   HGB 14.4 06/08/2019   HCT 42.3 06/08/2019   PLT 146 (L) 06/08/2019   GLUCOSE 99 06/08/2019   CHOL 92 (L) 07/27/2018   TRIG 86 07/27/2018   HDL 35 (L) 07/27/2018   LDLCALC 40 07/27/2018   ALT 10 06/08/2019   AST 17 06/08/2019   NA 145 (H) 06/08/2019   K 4.0 06/08/2019   CL 110 (H) 06/08/2019   CREATININE 1.47 (H) 06/08/2019   BUN 24 06/08/2019   CO2 22 06/08/2019   TSH 1.430 06/08/2019   INR 1.02 05/15/2012   HGBA1C 5.6 05/15/2012     BNP (last 3 results) No results for input(s): BNP in the last 8760 hours.  ProBNP (last 3 results) No results for input(s): PROBNP in the last 8760 hours.   Other Studies Reviewed Today:  CT HEAD CONCLUSION 10/30/2019 From [facility]:  1.  Similar small volume traumatic sulcal subarachnoid hemorrhage. No new areas of hemorrhage, significant intracranial mass effect, or hydrocephalus. 2.  Chronic senescent changes as detailed above.   Echo Study Conclusions 2017  - Procedure  narrative: Transthoracic echocardiography. Image  quality was poor. The study was technically difficult, as a  result of restricted patient mobility.  - Left ventricle: The cavity size was normal. Wall thickness was  increased in a pattern of mild LVH. Systolic function was normal.  The estimated ejection fraction was in the range of 60% to 65%.  Images were inadequate for LV wall motion assessment. The study  is not technically sufficient to allow evaluation of LV diastolic  function.  - Left atrium: The atrium was normal in size.  - Tricuspid valve: There was trivial regurgitation.  - Pulmonary arteries: PA peak pressure: 25 mm Hg (S) + RAP.  - Systemic veins: Not visualized.   Impressions:   - Compared to the prior echo in 2013, EF has improved to 60-65%.   Assessment/Plan:  1. Recent fall with subsequent SAH - required trauma admission to [facility] - now off Eliquis - noted that there was expansion on a follow up scan 8 hours - yet stable - to see neurosurgery with repeat scan in about 4 weeks. They are asking about resuming Eliquis - I explained that we would not do this until after repeat scan (scheduled for mid April) and even then, I think it is not feasible to resume given this recent event.   2. Thyroid nodule - it has been suggested to see ENT - I favor getting this bleeding/head issue resolved and then we can revisit this.   3. Lung nodule - recommended to have repeat study in one year.   4. Underlying PPM - no recent device check - they will send a manual download tonight for review to make sure no other precipitating events from last week.   5. Prior anticoagulation - seems to now be at higher risk for bleeding given recent [facility].   6. COVID-19 Education: The signs and symptoms of COVID-19 were discussed with the patient and how to seek care for testing (follow up with PCP or arrange E-visit).  The importance of social distancing, staying at home, hand hygiene and  wearing a mask when out in public were discussed today.  Current medicines are reviewed with the patient today.  The patient does not have concerns regarding medicines other than what has been noted above.  The following changes have been made:  See above.  Labs/ tests ordered today include:   No orders of the defined types were placed in this encounter.    Disposition:   FU with Dr. Johnsie Cancel after his repeat scan for final decisions per family's request.   Patient is agreeable to this plan and will call if any problems develop in the interim.   SignedTruitt Merle, NP  11/05/2019 3:55 PM  [facility] [address] [place], [place]  [address] Phone: [phone] Fax: [phone]      Addendum 11/08/19  Mechele Dawley, RN sent to Burtis Junes, NP    Mr. Grandt 3/15 transmission shows normal PPM function. No alerts. 1.8% AT/AF burden, longest AF episode was 17hrs 51min on 10/31/19. No high ventricular rate episodes.

## 2019-11-05 NOTE — Telephone Encounter (Signed)
Darnelle, from [facility] is calling for verbal orders, physical therapy 1 week 1x, 2 weeks 3x's.

## 2019-11-05 NOTE — Telephone Encounter (Signed)
Called Darnelle, [facility], with Alejandro Mulling PA's response. Darnelle thanked [place] for the call back.

## 2019-11-05 NOTE — Telephone Encounter (Signed)
That is fine 

## 2019-11-05 NOTE — Patient Instructions (Addendum)
After Visit Summary:  We will be checking the following labs today - NONE   Medication Instructions:    Continue with your current medicines. BUT  We are staying off the Eliquis for now   If you need a refill on your cardiac medications before your next appointment, please call your pharmacy.     Testing/Procedures To Be Arranged:  [name]  Follow-Up:   See Dr. Eden Emms towards the end of April for further discussion.   You do not need to come here next week    At [facility], you and your health needs are our priority.  As part of our continuing mission to provide you with exceptional heart care, we have created designated Provider Care Teams.  These Care Teams include your primary Cardiologist (physician) and Advanced Practice Providers (APPs -  Physician Assistants and Nurse Practitioners) who all work together to provide you with the care you need, when you need it.  Special Instructions:  . Stay safe, stay home, wash your hands for at least 20 seconds and wear a mask when out in public.  . It was good to talk with you today.  . I will check about your pacemaker and make sure that is still working ok.    Call the [facility] office at [phone] if you have any questions, problems or concerns.

## 2019-11-05 NOTE — Telephone Encounter (Signed)
Called patient's daughter-in-law back. She stated patient needs to be seen sooner than later to discuss starting back on eliquis. She stated that on discharge from hospital in [facility] that he was to follow up with cardiology in 2 to 3 days, and it's been 7. The reason for patient being in the hospital and stopping eliquis was due to a fall and having a head bleed. She does not want to wait to see Dr. Eden Emms next week. Norma Fredrickson NP had an opening this afternoon, she was agreeable to have patient be seen today.

## 2019-11-05 NOTE — Telephone Encounter (Signed)
Patient was discharged from the hospital on 10/29/19, he was to f/u with cardiology in 2-3 days. Patient is off of eliquis and it has now been 7 days since being discharged. Patients daughter in law called in to see if patient could be seen sooner, did not see any earlier appointments besides the one today with Norma Fredrickson. Called her Pod but got no answer. Please advise if patient can be seen today or earlier than his appt on 11/13/19.

## 2019-11-08 ENCOUNTER — Telehealth: Payer: Self-pay | Admitting: *Deleted

## 2019-11-08 NOTE — Telephone Encounter (Signed)
S/w pt's son per [facility]) is aware device ok, stay off eliquis.

## 2019-11-08 NOTE — Telephone Encounter (Signed)
-----   Message from Rosalio Macadamia, NP sent at 11/08/2019  8:59 AM EDT ----- Regarding: FW: 3/15 transmission Ok to let him know that his device check showed normal function. Has had some atrial fib.   Still needs to stay off Eliquis.   See back as planned.   Lawson Fiscal ----- Message ----- From: Nigel Sloop, RN Sent: 11/07/2019   6:57 PM EDT To: Rosalio Macadamia, NP Subject: 3/15 transmission                              Felecia Shelling,  Mr. Suarez 3/15 transmission shows normal PPM function. No alerts. 1.8% AT/AF burden, longest AF episode was 17hrs on 10/31/19. No high ventricular rate episodes.  Thanks, Irving Burton

## 2019-11-13 ENCOUNTER — Ambulatory Visit: Payer: Medicare HMO | Admitting: Cardiovascular Disease

## 2019-11-23 ENCOUNTER — Telehealth: Payer: Self-pay | Admitting: Cardiovascular Disease

## 2019-11-23 NOTE — Telephone Encounter (Signed)
Called Cecila with [facility] and informed her that Dr. Eden Emms was okay to extend to 3 weeks PT. Cecila thank [place] for the call.

## 2019-11-23 NOTE — Telephone Encounter (Signed)
   Cecilia physical therapist from [facility] calling, she would like to ask Dr. Eden Emms if he can extend PT for once a week for 3 weeks.. She said pt doesn't have a pcp   Please advise

## 2019-11-23 NOTE — Telephone Encounter (Signed)
Ok to extend to 3 weeks PT

## 2019-12-10 NOTE — Progress Notes (Signed)
Patient ID: Justin Baker, male   DOB: Jul 19, 1922, 86 y.o.   MRN: [id number]   12/17/2019 Equan Cogbill   July 05, 1922  [id number]  Primary Physician Patient, No Pcp Per Primary Cardiologist: Dr. Eden Emms   Reason for Visit/CC:  Pacer/ PAF   HPI: 86 y.o. with PAF first diagnosed in 2013. Initially poor risk for anticoagulation due to age and fall risk but had CVA at [facility] October 2016. TTE showed normal EF. Telemetry with PaF. Eventually settled on low dose Eliquis. CHADS2VASC 4   11/12/15 Had SJM Assurity MRI safe pacer implanted by Dr Graciela Husbands for tachy brady syndrome Device normal function with no PAF Amiodarone d/c 2018 for decreased DLCO   Baseline Cr is around 2.0 and in future only Multaq may be option if AAT needed  Seen at [facility] April 2021 for traumatic frontal lobe sulcal subarachnoid hemorrhage no mass effect or hydrocephalus he was leaning over to get something off floor and fell F/U CT 12/03/19 with resolution of subarachnoid hemorrhage His eliquis has been on hold   He feels much better off eliquis He is in NSR today on exam Son/patient and I agreed That stopping DOAC is best. Wife has dementia and is getting harder to care for at home  He is walking well and more stable on feet with walker   Current Outpatient Medications  Medication Sig Dispense Refill  . atorvastatin (LIPITOR) 40 MG tablet TAKE 1 TABLET BY MOUTH DAILY 90 tablet 3  . metoprolol tartrate (LOPRESSOR) 50 MG tablet TAKE ONE TABLET BY MOUTH TWICE DAILY 180 tablet 2  . Misc Natural Products (OSTEO BI-FLEX ADV JOINT SHIELD PO) Take 2 tablets by mouth daily.     No current facility-administered medications for this visit.    No Known Allergies  Social History   Socioeconomic History  . Marital status: Married    Spouse name: Not on file  . Number of children: Not on file  . Years of education: Not on file  . Highest education level: Not on file  Occupational History  . Not on file  Tobacco Use  . Smoking  status: Former Games developer  . Smokeless tobacco: Former Neurosurgeon    Quit date: 08/23/1976  Substance and Sexual Activity  . Alcohol use: No  . Drug use: No  . Sexual activity: Not on file  Other Topics Concern  . Not on file  Social History Narrative  . Not on file   Social Determinants of Health   Financial Resource Strain:   . Difficulty of Paying Living Expenses:   Food Insecurity:   . Worried About Programme researcher, broadcasting/film/video in the Last Year:   . Barista in the Last Year:   Transportation Needs:   . Freight forwarder (Medical):   Marland Kitchen Lack of Transportation (Non-Medical):   Physical Activity:   . Days of Exercise per Week:   . Minutes of Exercise per Session:   Stress:   . Feeling of Stress :   Social Connections:   . Frequency of Communication with Friends and Family:   . Frequency of Social Gatherings with Friends and Family:   . Attends Religious Services:   . Active Member of Clubs or Organizations:   . Attends Banker Meetings:   Marland Kitchen Marital Status:   Intimate Partner Violence:   . Fear of Current or Ex-Partner:   . Emotionally Abused:   Marland Kitchen Physically Abused:   . Sexually Abused:  Review of Systems: General: negative for chills, fever, night sweats or weight changes.  Cardiovascular: negative for chest pain, dyspnea on exertion, edema, orthopnea, palpitations, paroxysmal nocturnal dyspnea or shortness of breath Dermatological: negative for rash Respiratory: negative for cough or wheezing Urologic: negative for hematuria Abdominal: negative for nausea, vomiting, diarrhea, bright red blood per rectum, melena, or hematemesis Neurologic: negative for visual changes, syncope, or dizziness All other systems reviewed and are otherwise negative except as noted above.    Blood pressure 110/72, pulse 60, height 5\' 9"  (1.753 m), weight 189 lb (85.7 kg), SpO2 98 %.   Affect appropriate Chronically ill male  HEENT: normal Neck supple with no adenopathy JVP  normal no bruits no thyromegaly Lungs clear with no wheezing and good diaphragmatic motion Heart:  S1/S2 no murmur, no rub, gallop or click PMI normal pacer under left clavicle  Abdomen: benighn, BS positve, no tenderness, no AAA no bruit.  No HSM or HJR Distal pulses intact with no bruits No edema Neuro lest dysarthria improved  Skin warm and dry No muscular weakness   EKG  06/16/15  06/08/19 AV pacing rate 65   ASSESSMENT AND PLAN:   1. PAF: currently in SR  With AV pacing  Amiodarone d/c due to abnormal DLCO in 2018    2. CVA/SAH: traumatic from fall f/u CT 12/03/19 with resolution no further DOAC  3. Pacer:  Normal function rate response adjusted but still with some fatigue f/u EP St Jude device implant 2017  4. HLD:  On statin  LDL 40 great    PLAN  F/u with me in  6 months   [name]  12/17/2019 11:37 AM

## 2019-12-17 ENCOUNTER — Encounter (INDEPENDENT_AMBULATORY_CARE_PROVIDER_SITE_OTHER): Payer: Self-pay

## 2019-12-17 ENCOUNTER — Ambulatory Visit: Payer: Medicare HMO | Admitting: Cardiovascular Disease

## 2019-12-17 ENCOUNTER — Encounter: Payer: Self-pay | Admitting: Cardiovascular Disease

## 2019-12-17 ENCOUNTER — Other Ambulatory Visit: Payer: Self-pay

## 2019-12-17 VITALS — BP 110/72 | HR 60 | Ht 69.0 in | Wt 189.0 lb

## 2019-12-17 DIAGNOSIS — I480 Paroxysmal atrial fibrillation: Secondary | ICD-10-CM | POA: Diagnosis not present

## 2019-12-17 NOTE — Patient Instructions (Addendum)

## 2020-01-02 ENCOUNTER — Telehealth: Payer: Self-pay

## 2020-01-02 ENCOUNTER — Ambulatory Visit (INDEPENDENT_AMBULATORY_CARE_PROVIDER_SITE_OTHER): Payer: Medicare HMO | Admitting: *Deleted

## 2020-01-02 DIAGNOSIS — I495 Sick sinus syndrome: Secondary | ICD-10-CM | POA: Diagnosis not present

## 2020-01-02 LAB — CUP PACEART REMOTE DEVICE CHECK
Battery Remaining Longevity: 91 mo
Battery Remaining Percentage: 95.5 %
Battery Voltage: 2.96 V
Brady Statistic AP VP Percent: 97 %
Brady Statistic AP VS Percent: 1 %
Brady Statistic AS VP Percent: 1.5 %
Brady Statistic AS VS Percent: 1 %
Brady Statistic RA Percent Paced: 96 %
Brady Statistic RV Percent Paced: 98 %
Date Time Interrogation Session: 20210512133552
Implantable Lead Implant Date: 20170322
Implantable Lead Implant Date: 20170322
Implantable Lead Location: 753859
Implantable Lead Location: 753860
Implantable Pulse Generator Implant Date: 20170322
Lead Channel Impedance Value: 400 Ohm
Lead Channel Impedance Value: 440 Ohm
Lead Channel Pacing Threshold Amplitude: 0.75 V
Lead Channel Pacing Threshold Amplitude: 0.75 V
Lead Channel Pacing Threshold Pulse Width: 0.5 ms
Lead Channel Pacing Threshold Pulse Width: 0.5 ms
Lead Channel Sensing Intrinsic Amplitude: 1.6 mV
Lead Channel Sensing Intrinsic Amplitude: 7.5 mV
Lead Channel Setting Pacing Amplitude: 2 V
Lead Channel Setting Pacing Amplitude: 2.5 V
Lead Channel Setting Pacing Pulse Width: 0.5 ms
Lead Channel Setting Sensing Sensitivity: 2 mV
Pulse Gen Model: 2272
Pulse Gen Serial Number: 3134370

## 2020-01-02 NOTE — Telephone Encounter (Signed)
Spoke with patient to remind of missed remote transmission 

## 2020-01-03 NOTE — Progress Notes (Signed)
Remote pacemaker transmission.   

## 2020-04-02 ENCOUNTER — Ambulatory Visit (INDEPENDENT_AMBULATORY_CARE_PROVIDER_SITE_OTHER): Payer: Medicare HMO | Admitting: *Deleted

## 2020-04-02 DIAGNOSIS — I4891 Unspecified atrial fibrillation: Secondary | ICD-10-CM

## 2020-04-03 LAB — CUP PACEART REMOTE DEVICE CHECK
Battery Remaining Longevity: 84 mo
Battery Remaining Percentage: 89 %
Battery Voltage: 2.95 V
Brady Statistic AP VP Percent: 98 %
Brady Statistic AP VS Percent: 1 %
Brady Statistic AS VP Percent: 1 %
Brady Statistic AS VS Percent: 1 %
Brady Statistic RA Percent Paced: 97 %
Brady Statistic RV Percent Paced: 99 %
Date Time Interrogation Session: 20210812125522
Implantable Lead Implant Date: 20170322
Implantable Lead Implant Date: 20170322
Implantable Lead Location: 753859
Implantable Lead Location: 753860
Implantable Pulse Generator Implant Date: 20170322
Lead Channel Impedance Value: 400 Ohm
Lead Channel Impedance Value: 440 Ohm
Lead Channel Pacing Threshold Amplitude: 0.75 V
Lead Channel Pacing Threshold Amplitude: 0.75 V
Lead Channel Pacing Threshold Pulse Width: 0.5 ms
Lead Channel Pacing Threshold Pulse Width: 0.5 ms
Lead Channel Sensing Intrinsic Amplitude: 1.4 mV
Lead Channel Sensing Intrinsic Amplitude: 8.6 mV
Lead Channel Setting Pacing Amplitude: 2 V
Lead Channel Setting Pacing Amplitude: 2.5 V
Lead Channel Setting Pacing Pulse Width: 0.5 ms
Lead Channel Setting Sensing Sensitivity: 2 mV
Pulse Gen Model: 2272
Pulse Gen Serial Number: 3134370

## 2020-04-04 NOTE — Progress Notes (Signed)
Remote pacemaker transmission.   

## 2020-04-06 ENCOUNTER — Other Ambulatory Visit: Payer: Self-pay | Admitting: Cardiovascular Disease

## 2020-06-30 NOTE — Progress Notes (Signed)
Patient ID: Justin Baker, male   DOB: 12/15/1921, 86 y.o.   MRN: [id number]   07/01/2020 Justin Baker   06-23-1922  [id number]  Primary Physician Patient, No Pcp Per Primary Cardiologist: Dr. Eden Emms   Reason for Visit/CC:  Pacer/ PAF   HPI: 86 y.o. with PAF first diagnosed in 2013. Initially poor risk for anticoagulation due to age and fall risk but had CVA at [facility] October 2016. TTE showed normal EF. Telemetry with PAF. Eventually settled on low dose Eliquis. CHADS2VASC 4   11/12/15 Had SJM Assurity MRI safe pacer implanted by Dr Graciela Husbands for tachy brady syndrome Device normal function with no PAF Amiodarone d/c 2018 for decreased DLCO   Baseline Cr is around 2.0 and in future only Multaq may be option if AAT needed  Seen at [facility] April 2021 for traumatic frontal lobe sulcal subarachnoid hemorrhage no mass effect or hydrocephalus he was leaning over to get something off floor and fell F/U CT 12/03/19 with resolution of subarachnoid hemorrhage His eliquis has been on hold   He feels much better off eliquis He is in NSR today on exam Son/patient and I agreed That stopping DOAC is best. Wife has dementia and is getting harder to care for at home  He is walking well and more stable on feet with walker    Current Outpatient Medications  Medication Sig Dispense Refill  . atorvastatin (LIPITOR) 40 MG tablet TAKE 1 TABLET BY MOUTH DAILY 90 tablet 3  . metoprolol tartrate (LOPRESSOR) 50 MG tablet TAKE ONE TABLET BY MOUTH TWICE DAILY 180 tablet 2  . OVER THE COUNTER MEDICATION Super mega prostate     No current facility-administered medications for this visit.    No Known Allergies  Social History   Socioeconomic History  . Marital status: Married    Spouse name: Not on file  . Number of children: Not on file  . Years of education: Not on file  . Highest education level: Not on file  Occupational History  . Not on file  Tobacco Use  . Smoking status: Former Games developer  . Smokeless  tobacco: Former Neurosurgeon    Quit date: 08/23/1976  Vaping Use  . Vaping Use: Never used  Substance and Sexual Activity  . Alcohol use: No  . Drug use: No  . Sexual activity: Not on file  Other Topics Concern  . Not on file  Social History Narrative  . Not on file   Social Determinants of Health   Financial Resource Strain:   . Difficulty of Paying Living Expenses: Not on file  Food Insecurity:   . Worried About Programme researcher, broadcasting/film/video in the Last Year: Not on file  . Ran Out of Food in the Last Year: Not on file  Transportation Needs:   . Lack of Transportation (Medical): Not on file  . Lack of Transportation (Non-Medical): Not on file  Physical Activity:   . Days of Exercise per Week: Not on file  . Minutes of Exercise per Session: Not on file  Stress:   . Feeling of Stress : Not on file  Social Connections:   . Frequency of Communication with Friends and Family: Not on file  . Frequency of Social Gatherings with Friends and Family: Not on file  . Attends Religious Services: Not on file  . Active Member of Clubs or Organizations: Not on file  . Attends Banker Meetings: Not on file  . Marital Status: Not on file  Intimate  Partner Violence:   . Fear of Current or Ex-Partner: Not on file  . Emotionally Abused: Not on file  . Physically Abused: Not on file  . Sexually Abused: Not on file     Review of Systems: General: negative for chills, fever, night sweats or weight changes.  Cardiovascular: negative for chest pain, dyspnea on exertion, edema, orthopnea, palpitations, paroxysmal nocturnal dyspnea or shortness of breath Dermatological: negative for rash Respiratory: negative for cough or wheezing Urologic: negative for hematuria Abdominal: negative for nausea, vomiting, diarrhea, bright red blood per rectum, melena, or hematemesis Neurologic: negative for visual changes, syncope, or dizziness All other systems reviewed and are otherwise negative except as noted  above.    Blood pressure 128/66, pulse 61, height 5\' 9"  (1.753 m), weight 85.1 kg, SpO2 98 %.   Affect appropriate Chronically ill male  HEENT: normal Neck supple with no adenopathy JVP normal no bruits no thyromegaly Lungs clear with no wheezing and good diaphragmatic motion Heart:  S1/S2 no murmur, no rub, gallop or click PMI normal pacer under left clavicle  Abdomen: benighn, BS positve, no tenderness, no AAA no bruit.  No HSM or HJR Distal pulses intact with no bruits No edema Neuro lest dysarthria improved  Skin warm and dry No muscular weakness   EKG  06/16/15  06/08/19 AV pacing rate 61 LBBB pattern   ASSESSMENT AND PLAN:   1. PAF: currently in SR  With AV pacing  Amiodarone d/c due to abnormal DLCO in 2018    2. CVA/SAH: traumatic from fall f/u CT 12/03/19 with resolution no further DOAC Given age not clear He would be a Watchman FLX candidate but will forward chart to Dr 02/02/20 and Lalla Brothers   3. Pacer:  Normal function rate response adjusted but still with some fatigue f/u EP St Jude device implant 2017  4. HLD:  On statin  LDL 40 great  5. Health Maintenance : no primary flu shot given today     PLAN  F/u with me in  6 months   2018  07/01/2020 2:59 PM

## 2020-07-01 ENCOUNTER — Other Ambulatory Visit: Payer: Self-pay

## 2020-07-01 ENCOUNTER — Ambulatory Visit: Payer: Medicare HMO | Admitting: Cardiovascular Disease

## 2020-07-01 ENCOUNTER — Encounter: Payer: Self-pay | Admitting: Cardiovascular Disease

## 2020-07-01 VITALS — BP 128/66 | HR 61 | Ht 69.0 in | Wt 187.6 lb

## 2020-07-01 DIAGNOSIS — Z23 Encounter for immunization: Secondary | ICD-10-CM

## 2020-07-01 DIAGNOSIS — I4891 Unspecified atrial fibrillation: Secondary | ICD-10-CM

## 2020-07-01 NOTE — Patient Instructions (Signed)

## 2020-07-02 ENCOUNTER — Ambulatory Visit (INDEPENDENT_AMBULATORY_CARE_PROVIDER_SITE_OTHER): Payer: Medicare HMO

## 2020-07-02 DIAGNOSIS — I495 Sick sinus syndrome: Secondary | ICD-10-CM | POA: Diagnosis not present

## 2020-07-03 LAB — CUP PACEART REMOTE DEVICE CHECK
Battery Remaining Longevity: 84 mo
Battery Remaining Percentage: 89 %
Battery Voltage: 2.95 V
Brady Statistic AP VP Percent: 98 %
Brady Statistic AP VS Percent: 1 %
Brady Statistic AS VP Percent: 1 %
Brady Statistic AS VS Percent: 1 %
Brady Statistic RA Percent Paced: 97 %
Brady Statistic RV Percent Paced: 99 %
Date Time Interrogation Session: 20211111153017
Implantable Lead Implant Date: 20170322
Implantable Lead Implant Date: 20170322
Implantable Lead Location: 753859
Implantable Lead Location: 753860
Implantable Pulse Generator Implant Date: 20170322
Lead Channel Impedance Value: 400 Ohm
Lead Channel Impedance Value: 440 Ohm
Lead Channel Pacing Threshold Amplitude: 0.75 V
Lead Channel Pacing Threshold Amplitude: 0.75 V
Lead Channel Pacing Threshold Pulse Width: 0.5 ms
Lead Channel Pacing Threshold Pulse Width: 0.5 ms
Lead Channel Sensing Intrinsic Amplitude: 1.8 mV
Lead Channel Sensing Intrinsic Amplitude: 9.1 mV
Lead Channel Setting Pacing Amplitude: 2 V
Lead Channel Setting Pacing Amplitude: 2.5 V
Lead Channel Setting Pacing Pulse Width: 0.5 ms
Lead Channel Setting Sensing Sensitivity: 2 mV
Pulse Gen Model: 2272
Pulse Gen Serial Number: 3134370

## 2020-07-04 NOTE — Progress Notes (Signed)
Remote pacemaker transmission.   

## 2020-07-07 ENCOUNTER — Other Ambulatory Visit: Payer: Self-pay | Admitting: Cardiovascular Disease

## 2020-09-26 ENCOUNTER — Encounter: Payer: Medicare HMO | Admitting: Student

## 2020-10-01 ENCOUNTER — Ambulatory Visit (INDEPENDENT_AMBULATORY_CARE_PROVIDER_SITE_OTHER): Payer: Medicare HMO

## 2020-10-01 DIAGNOSIS — I495 Sick sinus syndrome: Secondary | ICD-10-CM | POA: Diagnosis not present

## 2020-10-03 ENCOUNTER — Encounter: Payer: Self-pay | Admitting: Physician Assistant

## 2020-10-03 ENCOUNTER — Ambulatory Visit: Payer: Medicare HMO | Admitting: Physician Assistant

## 2020-10-03 ENCOUNTER — Other Ambulatory Visit: Payer: Self-pay

## 2020-10-03 VITALS — BP 138/62 | HR 64 | Ht 69.0 in | Wt 188.0 lb

## 2020-10-03 DIAGNOSIS — I4891 Unspecified atrial fibrillation: Secondary | ICD-10-CM

## 2020-10-03 DIAGNOSIS — I480 Paroxysmal atrial fibrillation: Secondary | ICD-10-CM | POA: Diagnosis not present

## 2020-10-03 DIAGNOSIS — Z950 Presence of cardiac pacemaker: Secondary | ICD-10-CM

## 2020-10-03 LAB — CUP PACEART INCLINIC DEVICE CHECK
Battery Remaining Longevity: 79 mo
Battery Voltage: 2.95 V
Brady Statistic RA Percent Paced: 97 %
Brady Statistic RV Percent Paced: 99 %
Date Time Interrogation Session: 20220211174308
Implantable Lead Implant Date: 20170322
Implantable Lead Implant Date: 20170322
Implantable Lead Location: 753859
Implantable Lead Location: 753860
Implantable Pulse Generator Implant Date: 20170322
Lead Channel Impedance Value: 412.5 Ohm
Lead Channel Impedance Value: 412.5 Ohm
Lead Channel Impedance Value: 437.5 Ohm
Lead Channel Impedance Value: 437.5 Ohm
Lead Channel Pacing Threshold Amplitude: 0.75 V
Lead Channel Pacing Threshold Amplitude: 0.75 V
Lead Channel Pacing Threshold Amplitude: 0.75 V
Lead Channel Pacing Threshold Amplitude: 0.75 V
Lead Channel Pacing Threshold Amplitude: 0.75 V
Lead Channel Pacing Threshold Amplitude: 0.75 V
Lead Channel Pacing Threshold Amplitude: 0.75 V
Lead Channel Pacing Threshold Amplitude: 0.75 V
Lead Channel Pacing Threshold Pulse Width: 0.5 ms
Lead Channel Pacing Threshold Pulse Width: 0.5 ms
Lead Channel Pacing Threshold Pulse Width: 0.5 ms
Lead Channel Pacing Threshold Pulse Width: 0.5 ms
Lead Channel Pacing Threshold Pulse Width: 0.5 ms
Lead Channel Pacing Threshold Pulse Width: 0.5 ms
Lead Channel Pacing Threshold Pulse Width: 0.5 ms
Lead Channel Pacing Threshold Pulse Width: 0.5 ms
Lead Channel Sensing Intrinsic Amplitude: 1.5 mV
Lead Channel Sensing Intrinsic Amplitude: 1.5 mV
Lead Channel Sensing Intrinsic Amplitude: 6.7 mV
Lead Channel Sensing Intrinsic Amplitude: 6.7 mV
Lead Channel Setting Pacing Amplitude: 2 V
Lead Channel Setting Pacing Amplitude: 2.5 V
Lead Channel Setting Pacing Pulse Width: 0.5 ms
Lead Channel Setting Sensing Sensitivity: 2 mV
Pulse Gen Model: 2272
Pulse Gen Serial Number: 3134370

## 2020-10-03 LAB — CUP PACEART REMOTE DEVICE CHECK
Battery Remaining Longevity: 84 mo
Battery Remaining Percentage: 89 %
Battery Voltage: 2.95 V
Brady Statistic AP VP Percent: 99 %
Brady Statistic AP VS Percent: 1 %
Brady Statistic AS VP Percent: 1 %
Brady Statistic AS VS Percent: 1 %
Brady Statistic RA Percent Paced: 97 %
Brady Statistic RV Percent Paced: 99 %
Date Time Interrogation Session: 20220209042414
Implantable Lead Implant Date: 20170322
Implantable Lead Implant Date: 20170322
Implantable Lead Location: 753859
Implantable Lead Location: 753860
Implantable Pulse Generator Implant Date: 20170322
Lead Channel Impedance Value: 400 Ohm
Lead Channel Impedance Value: 430 Ohm
Lead Channel Pacing Threshold Amplitude: 0.75 V
Lead Channel Pacing Threshold Amplitude: 0.75 V
Lead Channel Pacing Threshold Pulse Width: 0.5 ms
Lead Channel Pacing Threshold Pulse Width: 0.5 ms
Lead Channel Sensing Intrinsic Amplitude: 1.5 mV
Lead Channel Sensing Intrinsic Amplitude: 10.1 mV
Lead Channel Setting Pacing Amplitude: 2 V
Lead Channel Setting Pacing Amplitude: 2.5 V
Lead Channel Setting Pacing Pulse Width: 0.5 ms
Lead Channel Setting Sensing Sensitivity: 2 mV
Pulse Gen Model: 2272
Pulse Gen Serial Number: 3134370

## 2020-10-03 NOTE — Patient Instructions (Signed)
Medication Instructions:   Your physician recommends that you continue on your current medications as directed. Please refer to the Current Medication list given to you today.  *If you need a refill on your cardiac medications before your next appointment, please call your pharmacy*   Lab Work: NONE ORDERED  TODAY   If you have labs (blood work) drawn today and your tests are completely normal, you will receive your results only by: Marland Kitchen MyChart Message (if you have MyChart) OR . A paper copy in the mail If you have any lab test that is abnormal or we need to change your treatment, we will call you to review the results.   Testing/Procedures: NONE ORDERED  TODAY    Follow-Up: At [facility], you and your health needs are our priority.  As part of our continuing mission to provide you with exceptional heart care, we have created designated Provider Care Teams.  These Care Teams include your primary Cardiologist (physician) and Advanced Practice Providers (APPs -  Physician Assistants and Nurse Practitioners) who all work together to provide you with the care you need, when you need it.  We recommend signing up for the patient portal called "MyChart".  Sign up information is provided on this After Visit Summary.  MyChart is used to connect with patients for Virtual Visits (Telemedicine).  Patients are able to view lab/test results, encounter notes, upcoming appointments, etc.  Non-urgent messages can be sent to your provider as well.   To learn more about what you can do with MyChart, go to [email or web address].    Your next appointment:    Dr. Eden Emms May..              AND   1 year(s)  The format for your next appointment:   In Person  Provider:   You may see Dr. Johney Frame  or one of the following Advanced Practice Providers on your designated Care Team:    Gypsy Balsam, NP  Francis Dowse, PA-C  Casimiro Needle "Otilio Saber, [place]    Other Instructions

## 2020-10-03 NOTE — Progress Notes (Signed)
Cardiology Office Note Date:  10/03/2020  Patient ID:  Justin, Baker [birth year], MRN [id number] PCP:  Patient, No Pcp Per  Cardiologist:  Dr. Eden Emms Electrophysiologist: Dr. Johney Frame    Chief Complaint: annual device visit  History of Present Illness: Justin Baker is a 87 y.o. male with history of AFib, initially not a/c 2/2 falls though suffered a stroke> eliquis.  tachy-brady w/PPM, COPD, traumatic SDH April 2021 > eliquis held  She comes in today to be seen for Dr. Johney Frame, last seen by him 2018. Seen most recently for EP by A. Virgil, [place] Feb 2021.  AFib burden approx 1%, felt on AAD if needed would be multaq.  Most recently saw Dr. Eden Emms Nov 2021, discussed sending chart to Drs Lambert/Cooper to consider watchman, though given age, not clear if he would be a candidate. No changes were made, planned for 21mo visit.  TODAY He is with his son, comes in a wheelchair though they report is easier getting around in the building that way.  Ambulates at  Home with a walk and does well. He denies any cardiac concerns.  No CP, palpitations. No dizzy spells, near syncope or syncope. Not particularly active, though denies SOB. Says his R hip is what bothers him most.  Device information SJM dual chamber PPM, implanted    AFib Hx Diagnosed 2013  AAD amiodarone stopped 2018 2/2 abn PFTs  Past Medical History:  Diagnosis Date  . Atrial fibrillation (HCC)   . Back pain   . COPD (chronic obstructive pulmonary disease) (HCC)    self diagnosed  . Osteoarthritis    s/p right total hip  . Shoulder pain, bilateral   . Stroke (HCC)   . Tachy-brady syndrome (HCC)    a. s/p STJ dual chamber PPM   . Thrombocytopenia (HCC)     Past Surgical History:  Procedure Laterality Date  . CARDIOVERSION  05/17/2012   Procedure: CARDIOVERSION;  Surgeon: Dolores Patty, MD;  Location: [facility] ENDOSCOPY;  Service: Cardiovascular;  Laterality: [name];  . CATARACT EXTRACTION W/ INTRAOCULAR LENS IMPLANT      right  . EP IMPLANTABLE DEVICE [name] 11/12/2015   SJM Assurity MRI DR PPM implanted by Dr Graciela Husbands for tachy/brady syndrome  . TEE WITHOUT CARDIOVERSION  05/17/2012   Procedure: TRANSESOPHAGEAL ECHOCARDIOGRAM (TEE);  Surgeon: Dolores Patty, MD;  Location: [facility] ENDOSCOPY;  Service: Cardiovascular;  Laterality: [name];  . TOTAL HIP ARTHROPLASTY     right    Current Outpatient Medications  Medication Sig Dispense Refill  . atorvastatin (LIPITOR) 40 MG tablet TAKE 1 TABLET BY MOUTH DAILY 90 tablet 3  . metoprolol tartrate (LOPRESSOR) 50 MG tablet TAKE ONE TABLET BY MOUTH TWICE DAILY 180 tablet 2  . OVER THE COUNTER MEDICATION Super mega prostate     No current facility-administered medications for this visit.    Allergies:   Patient has no known allergies.   Social History:  The patient  reports that he has quit smoking. He quit smokeless tobacco use about 44 years ago. He reports that he does not drink alcohol and does not use drugs.   Family History:  The patient's family history includes Other in his unknown relative; Tuberculosis in his father.  ROS:  Please see the history of present illness.    All other systems are reviewed and otherwise negative.   PHYSICAL EXAM:  VS:  There were no vitals taken for this visit. BMI: There is no height or weight on file to calculate BMI.  Well nourished, well developed, in no acute distress HEENT: normocephalic, atraumatic Neck: no JVD, carotid bruits or masses Cardiac: RRR; no significant murmurs, no rubs, or gallops Lungs:  CTA b/l, no wheezing, rhonchi or rales Abd: soft, nontender MS: no deformity, age appropriate atrophy Ext: trace edema Skin: warm and dry, no rash Neuro:  No gross deficits appreciated Psych: euthymic mood, full affect  PPM site is stable, no tethering or discomfort   EKG:  Not done today  Device interrogation done today and reviewed by myself:  Battery and lead measurements are good No P waves at 40 today AMS  episodes are noted, only 2 EGMs One is a very short AFlutter, the other not true AF, with intermittent farfield on the A channel Longest AMS episode was 17 hours march 2021 (no EGM) Burden <1% PMT noted, algorithm works PVARP at Retrograde conduction noted today at and falls in AR Max tracking is 110bpm No programming changes made   11/13/2015: TTE Study Conclusions  - Procedure narrative: Transthoracic echocardiography. Image  quality was poor. The study was technically difficult, as a  result of restricted patient mobility.  - Left ventricle: The cavity size was normal. Wall thickness was  increased in a pattern of mild LVH. Systolic function was normal.  The estimated ejection fraction was in the range of 60% to 65%.  Images were inadequate for LV wall motion assessment. The study  is not technically sufficient to allow evaluation of LV diastolic  function.  - Left atrium: The atrium was normal in size.  - Tricuspid valve: There was trivial regurgitation.  - Pulmonary arteries: PA peak pressure: 25 mm Hg (S) + RAP.  - Systemic veins: Not visualized.   Impressions:  - Compared to the prior echo in 2013, EF has improved to 60-65%.     07/10/2012: stress myoview Impression Exercise Capacity:  Lexiscan with no exercise. BP Response:  Normal blood pressure response. Clinical Symptoms:  headache ECG Impression:  No significant ST segment change suggestive of ischemia. Comparison with Prior Nuclear Study: No images to compare  Overall Impression:  Normal stress nuclear study.  LV Ejection Fraction: 65%.  LV Wall Motion:  Normal Wall Motion.    Recent Labs: No results found for requested labs within last 8760 hours.  No results found for requested labs within last 8760 hours.   CrCl cannot be calculated (Patient's most recent lab result is older than the maximum 21 days allowed.).   Wt Readings from Last 3 Encounters:  07/01/20 187 lb 9.6 oz  (85.1 kg)  12/17/19 189 lb (85.7 kg)  11/05/19 189 lb 8 oz (86 kg)     Other studies reviewed: Additional studies/records reviewed today include: summarized above  ASSESSMENT AND PLAN:  1. PPM     Intact function, no programming changes made     Findings discussed above  2. Paroxysmal Afib     CHA2DS2Vasc is 4, off a/c 2/2 traumatic SDH     <1 % burden  Dr. Eden Emms mentions perhaps consideration for LAA closure device, the patient though says he would not want to have procedures.  Says when he had the bleeding on his brain he denied surgical option for management.    Disposition: F/u with remotes as usual an in clinic with EP in a year, sooner if needed.  C.w Dr. Eden Emms as recommended by him, due in May.  Current medicines are reviewed at length with the patient today.  The patient did not have  any concerns regarding medicines.  Norma Fredrickson, PA-C 10/03/2020 4:08 AM     [facility] [address] [place] [address] [phone] (office)  [phone] (fax)

## 2020-10-07 NOTE — Progress Notes (Signed)
Remote pacemaker transmission.   

## 2020-10-13 ENCOUNTER — Other Ambulatory Visit: Payer: Self-pay | Admitting: Cardiovascular Disease

## 2020-12-31 ENCOUNTER — Ambulatory Visit (INDEPENDENT_AMBULATORY_CARE_PROVIDER_SITE_OTHER): Payer: Medicare HMO

## 2020-12-31 DIAGNOSIS — I495 Sick sinus syndrome: Secondary | ICD-10-CM

## 2020-12-31 LAB — CUP PACEART REMOTE DEVICE CHECK
Battery Remaining Longevity: 83 mo
Battery Remaining Percentage: 89 %
Battery Voltage: 2.95 V
Brady Statistic AP VP Percent: 99 %
Brady Statistic AP VS Percent: 1 %
Brady Statistic AS VP Percent: 1 %
Brady Statistic AS VS Percent: 1 %
Brady Statistic RA Percent Paced: 95 %
Brady Statistic RV Percent Paced: 96 %
Date Time Interrogation Session: 20220511075848
Implantable Lead Implant Date: 20170322
Implantable Lead Implant Date: 20170322
Implantable Lead Location: 753859
Implantable Lead Location: 753860
Implantable Pulse Generator Implant Date: 20170322
Lead Channel Impedance Value: 400 Ohm
Lead Channel Impedance Value: 440 Ohm
Lead Channel Pacing Threshold Amplitude: 0.75 V
Lead Channel Pacing Threshold Amplitude: 0.75 V
Lead Channel Pacing Threshold Pulse Width: 0.5 ms
Lead Channel Pacing Threshold Pulse Width: 0.5 ms
Lead Channel Sensing Intrinsic Amplitude: 1.5 mV
Lead Channel Sensing Intrinsic Amplitude: 9.5 mV
Lead Channel Setting Pacing Amplitude: 2 V
Lead Channel Setting Pacing Amplitude: 2.5 V
Lead Channel Setting Pacing Pulse Width: 0.5 ms
Lead Channel Setting Sensing Sensitivity: 2 mV
Pulse Gen Model: 2272
Pulse Gen Serial Number: 3134370

## 2021-01-21 DIAGNOSIS — U071 COVID-19: Secondary | ICD-10-CM

## 2021-01-21 HISTORY — DX: COVID-19: U07.1

## 2021-01-22 NOTE — Progress Notes (Signed)
Remote pacemaker transmission.   

## 2021-03-16 ENCOUNTER — Telehealth: Payer: Self-pay | Admitting: Cardiovascular Disease

## 2021-03-16 NOTE — Telephone Encounter (Signed)
Tried to call Occupational therapist back, left message for her to call our office back.

## 2021-03-16 NOTE — Telephone Encounter (Signed)
OT called back and informed them they will need to contact patient's PCP.

## 2021-03-16 NOTE — Telephone Encounter (Signed)
Crystal w/ [facility] has orders for pt please advise

## 2021-03-24 ENCOUNTER — Telehealth: Payer: Self-pay | Admitting: Cardiovascular Disease

## 2021-03-24 NOTE — Telephone Encounter (Signed)
Verbal given to [place]. She will fax over paper work to be signed.

## 2021-03-24 NOTE — Telephone Encounter (Signed)
New Message:    She needs an order for Physical Therapy for 1 time a week for 7 weeks.  She needs a verbal order please.

## 2021-03-30 ENCOUNTER — Telehealth: Payer: Self-pay | Admitting: Cardiovascular Disease

## 2021-03-30 NOTE — Telephone Encounter (Signed)
Crystal was calling to request verbal orders for Occupational Therapy 1x weekly for 4 weeks. Please call to provide verbal orders

## 2021-03-30 NOTE — Telephone Encounter (Signed)
Called Crystal with [facility] and per Dr. Eden Emms okay to give verbal order. Crystal verbalized understanding.

## 2021-04-01 ENCOUNTER — Ambulatory Visit (INDEPENDENT_AMBULATORY_CARE_PROVIDER_SITE_OTHER): Payer: Medicare HMO

## 2021-04-01 DIAGNOSIS — I495 Sick sinus syndrome: Secondary | ICD-10-CM

## 2021-04-01 LAB — CUP PACEART REMOTE DEVICE CHECK
Battery Remaining Longevity: 36 mo
Battery Remaining Percentage: 38 %
Battery Voltage: 2.93 V
Brady Statistic AP VP Percent: 96 %
Brady Statistic AP VS Percent: 1 %
Brady Statistic AS VP Percent: 2 %
Brady Statistic AS VS Percent: 1 %
Brady Statistic RA Percent Paced: 86 %
Brady Statistic RV Percent Paced: 89 %
Date Time Interrogation Session: 20220810063925
Implantable Lead Implant Date: 20170322
Implantable Lead Implant Date: 20170322
Implantable Lead Location: 753859
Implantable Lead Location: 753860
Implantable Pulse Generator Implant Date: 20170322
Lead Channel Impedance Value: 410 Ohm
Lead Channel Impedance Value: 440 Ohm
Lead Channel Pacing Threshold Amplitude: 0.75 V
Lead Channel Pacing Threshold Amplitude: 0.75 V
Lead Channel Pacing Threshold Pulse Width: 0.5 ms
Lead Channel Pacing Threshold Pulse Width: 0.5 ms
Lead Channel Sensing Intrinsic Amplitude: 1.6 mV
Lead Channel Sensing Intrinsic Amplitude: 10.3 mV
Lead Channel Setting Pacing Amplitude: 2 V
Lead Channel Setting Pacing Amplitude: 2.5 V
Lead Channel Setting Pacing Pulse Width: 0.5 ms
Lead Channel Setting Sensing Sensitivity: 2 mV
Pulse Gen Model: 2272
Pulse Gen Serial Number: 3134370

## 2021-04-10 NOTE — Progress Notes (Signed)
Patient ID: Justin Baker, male   DOB: [birth year], 87 y.o.   MRN: [id number]   04/15/2021 [place]   06-24-1922  [id number]  Primary Physician Patient, No Pcp Per (Inactive) Primary Cardiologist: Dr. Eden Emms   Reason for Visit/CC:  Pacer/ PAF   HPI: 87 y.o. with PAF first diagnosed in 2013. Initially poor risk for anticoagulation due to age and fall risk but had CVA at [facility] October 2016. TTE showed normal EF. Telemetry with PAF. Eventually settled on low dose Eliquis. CHADS2VASC 4   11/12/15 Had SJM Assurity MRI safe pacer implanted by Dr Graciela Husbands for tachy brady syndrome Device normal function with no PAF Amiodarone d/c 2018 for decreased DLCO   Baseline Cr is around 2.0 and in future only Multaq may be option if AAT needed  Seen at [facility] April 2021 for traumatic frontal lobe sulcal subarachnoid hemorrhage no mass effect or hydrocephalus he was leaning over to get something off floor and fell F/U CT 12/03/19 with resolution of subarachnoid hemorrhage His eliquis has been on hold   He feels much better off eliquis He is in NSR  Eliquis d/c 06/2020 shared decision making with patient and son . He also did not want to entertain the idea of Watchman  Wife has dementia and in NH now  He is walking well and more stable on feet with walker   Had COVID in June hospitalized in [facility] for 3 weeks and sent to rehab got to room with his wife for a few weeks.  Still some weak from infection Had not been vaccinated   Current Outpatient Medications  Medication Sig Dispense Refill   atorvastatin (LIPITOR) 40 MG tablet TAKE 1 TABLET BY MOUTH DAILY 90 tablet 3   metoprolol tartrate (LOPRESSOR) 50 MG tablet TAKE ONE TABLET BY MOUTH TWICE DAILY 180 tablet 2   OVER THE COUNTER MEDICATION Super mega prostate (Patient not taking: Reported on 04/15/2021)     No current facility-administered medications for this visit.    No Known Allergies  Social History   Socioeconomic History   Marital status:  Married    Spouse name: Not on file   Number of children: Not on file   Years of education: Not on file   Highest education level: Not on file  Occupational History   Not on file  Tobacco Use   Smoking status: Former   Smokeless tobacco: Former    Quit date: 08/23/1976  Vaping Use   Vaping Use: Never used  Substance and Sexual Activity   Alcohol use: No   Drug use: No   Sexual activity: Not on file  Other Topics Concern   Not on file  Social History Narrative   Not on file   Social Determinants of Health   Financial Resource Strain: Not on file  Food Insecurity: Not on file  Transportation Needs: Not on file  Physical Activity: Not on file  Stress: Not on file  Social Connections: Not on file  Intimate Partner Violence: Not on file     Review of Systems: General: negative for chills, fever, night sweats or weight changes.  Cardiovascular: negative for chest pain, dyspnea on exertion, edema, orthopnea, palpitations, paroxysmal nocturnal dyspnea or shortness of breath Dermatological: negative for rash Respiratory: negative for cough or wheezing Urologic: negative for hematuria Abdominal: negative for nausea, vomiting, diarrhea, bright red blood per rectum, melena, or hematemesis Neurologic: negative for visual changes, syncope, or dizziness All other systems reviewed and are otherwise negative except  as noted above.    Blood pressure 100/68, pulse 64, height 5\' 9"  (1.753 m), weight 86.2 kg, SpO2 97 %.   Affect appropriate Chronically ill male  HEENT: normal Neck supple with no adenopathy JVP normal no bruits no thyromegaly Lungs clear with no wheezing and good diaphragmatic motion Heart:  S1/S2 no murmur, no rub, gallop or click PMI normal pacer under left clavicle  Abdomen: benighn, BS positve, no tenderness, no AAA no bruit.  No HSM or HJR Distal pulses intact with no bruits No edema Neuro lest dysarthria improved  Skin warm and dry No muscular  weakness   EKG  06/16/15  06/08/19 AV pacing rate 61 LBBB pattern   ASSESSMENT AND PLAN:   1. PAF: currently in SR  With AV pacing  Amiodarone d/c due to abnormal DLCO in 2018    2. CVA/SAH: traumatic from fall f/u CT 12/03/19 with resolution no further DOAC Given age not clear He would be a Watchman FLX candidate and patient declines anyway    3. Pacer:  Normal function rate response adjusted but still with some fatigue f/u EP St Jude device implant 2017  4. HLD:  On statin  LDL 40 great  5. Health Maintenance : no primary  Discussed getting flu shot next month and COVID vaccine this winter post infection    PLAN  F/u with EP only given age and only issues being PAF/PPM  2018  04/15/2021 11:19 AM

## 2021-04-15 ENCOUNTER — Ambulatory Visit: Payer: Medicare HMO | Admitting: Cardiovascular Disease

## 2021-04-15 ENCOUNTER — Other Ambulatory Visit: Payer: Self-pay

## 2021-04-15 ENCOUNTER — Encounter: Payer: Self-pay | Admitting: Cardiovascular Disease

## 2021-04-15 VITALS — BP 100/68 | HR 64 | Ht 69.0 in | Wt 190.0 lb

## 2021-04-15 DIAGNOSIS — I480 Paroxysmal atrial fibrillation: Secondary | ICD-10-CM

## 2021-04-15 DIAGNOSIS — E782 Mixed hyperlipidemia: Secondary | ICD-10-CM

## 2021-04-15 DIAGNOSIS — Z950 Presence of cardiac pacemaker: Secondary | ICD-10-CM | POA: Diagnosis not present

## 2021-04-15 NOTE — Patient Instructions (Signed)
Medication Instructions:  Your physician recommends that you continue on your current medications as directed. Please refer to the Current Medication list given to you today.  *If you need a refill on your cardiac medications before your next appointment, please call your pharmacy*  Lab Work: If you have labs (blood work) drawn today and your tests are completely normal, you will receive your results only by: MyChart Message (if you have MyChart) OR A paper copy in the mail If you have any lab test that is abnormal or we need to change your treatment, we will call you to review the results.  Testing/Procedures: None ordered today.  Follow-Up: At [facility], you and your health needs are our priority.  As part of our continuing mission to provide you with exceptional heart care, we have created designated Provider Care Teams.  These Care Teams include your primary Cardiologist (physician) and Advanced Practice Providers (APPs -  Physician Assistants and Nurse Practitioners) who all work together to provide you with the care you need, when you need it.  We recommend signing up for the patient portal called "MyChart".  Sign up information is provided on this After Visit Summary.  MyChart is used to connect with patients for Virtual Visits (Telemedicine).  Patients are able to view lab/test results, encounter notes, upcoming appointments, etc.  Non-urgent messages can be sent to your provider as well.   To learn more about what you can do with MyChart, go to [email or web address].    Your next appointment:   3 month(s)  The format for your next appointment:   In Person  Provider:   You may see Dr. Johney Frame or one of the following Advanced Practice Providers on your designated Care Team:   Francis Dowse, [place] Casimiro Needle "[facility]" [place], [place]

## 2021-04-22 ENCOUNTER — Telehealth: Payer: Self-pay | Admitting: Cardiovascular Disease

## 2021-04-22 NOTE — Telephone Encounter (Signed)
Will forward to Dr. Nishan so he is aware. 

## 2021-04-22 NOTE — Telephone Encounter (Signed)
Victorino Dike with [facility] states the patient requested to be discharged from home health, so they are releasing him today, per his request. If questions, return call to Victorino Dike to discuss at [phone].

## 2021-04-23 NOTE — Progress Notes (Signed)
Remote pacemaker transmission.   

## 2021-07-01 ENCOUNTER — Ambulatory Visit (INDEPENDENT_AMBULATORY_CARE_PROVIDER_SITE_OTHER): Payer: Medicare HMO

## 2021-07-01 DIAGNOSIS — I495 Sick sinus syndrome: Secondary | ICD-10-CM

## 2021-07-01 LAB — CUP PACEART REMOTE DEVICE CHECK
Battery Remaining Longevity: 32 mo
Battery Remaining Percentage: 35 %
Battery Voltage: 2.93 V
Brady Statistic AP VP Percent: 93 %
Brady Statistic AP VS Percent: 1 %
Brady Statistic AS VP Percent: 3.2 %
Brady Statistic AS VS Percent: 1 %
Brady Statistic RA Percent Paced: 83 %
Brady Statistic RV Percent Paced: 89 %
Date Time Interrogation Session: 20221109042421
Implantable Lead Implant Date: 20170322
Implantable Lead Implant Date: 20170322
Implantable Lead Location: 753859
Implantable Lead Location: 753860
Implantable Pulse Generator Implant Date: 20170322
Lead Channel Impedance Value: 400 Ohm
Lead Channel Impedance Value: 450 Ohm
Lead Channel Pacing Threshold Amplitude: 0.75 V
Lead Channel Pacing Threshold Amplitude: 0.75 V
Lead Channel Pacing Threshold Pulse Width: 0.5 ms
Lead Channel Pacing Threshold Pulse Width: 0.5 ms
Lead Channel Sensing Intrinsic Amplitude: 2.5 mV
Lead Channel Sensing Intrinsic Amplitude: 6.6 mV
Lead Channel Setting Pacing Amplitude: 2 V
Lead Channel Setting Pacing Amplitude: 2.5 V
Lead Channel Setting Pacing Pulse Width: 0.5 ms
Lead Channel Setting Sensing Sensitivity: 2 mV
Pulse Gen Model: 2272
Pulse Gen Serial Number: 3134370

## 2021-07-09 NOTE — Progress Notes (Signed)
Remote pacemaker transmission.   

## 2021-07-22 ENCOUNTER — Encounter: Payer: Medicare HMO | Admitting: Internal Medicine

## 2021-07-27 ENCOUNTER — Other Ambulatory Visit: Payer: Self-pay

## 2021-07-27 ENCOUNTER — Ambulatory Visit: Payer: Medicare HMO | Admitting: Internal Medicine

## 2021-07-27 ENCOUNTER — Encounter: Payer: Self-pay | Admitting: Internal Medicine

## 2021-07-27 VITALS — BP 138/60 | HR 81 | Ht 69.0 in | Wt 196.6 lb

## 2021-07-27 DIAGNOSIS — R001 Bradycardia, unspecified: Secondary | ICD-10-CM

## 2021-07-27 DIAGNOSIS — R000 Tachycardia, unspecified: Secondary | ICD-10-CM

## 2021-07-27 DIAGNOSIS — I480 Paroxysmal atrial fibrillation: Secondary | ICD-10-CM

## 2021-07-27 NOTE — Progress Notes (Signed)
    PCP: Patient, No Pcp Per (Inactive) Primary Cardiologist: Dr Eden Emms Primary EP:  Dr Johney Frame  Justin Baker is a 87 y.o. male who presents today for routine electrophysiology followup.  Since last being seen in our clinic, the patient reports doing reasonably well.  He has chronic SOB.  He also has venous insufficiency with edema.  Today, he denies symptoms of palpitations, chest pain, dizziness, presyncope, or syncope.  The patient is otherwise without complaint today.   Past Medical History:  Diagnosis Date   Atrial fibrillation (HCC)    Back pain    COPD (chronic obstructive pulmonary disease) (HCC)    self diagnosed   Osteoarthritis    s/p right total hip   Shoulder pain, bilateral    Stroke (HCC)    Tachy-brady syndrome (HCC)    a. s/p STJ dual chamber PPM    Thrombocytopenia (HCC)    Past Surgical History:  Procedure Laterality Date   CARDIOVERSION  05/17/2012   Procedure: CARDIOVERSION;  Surgeon: Dolores Patty, MD;  Location: [facility] ENDOSCOPY;  Service: Cardiovascular;  Laterality: [name];   CATARACT EXTRACTION W/ INTRAOCULAR LENS IMPLANT     right   EP IMPLANTABLE DEVICE [name] 11/12/2015   SJM Assurity MRI DR PPM implanted by Dr Graciela Husbands for tachy/brady syndrome   TEE WITHOUT CARDIOVERSION  05/17/2012   Procedure: TRANSESOPHAGEAL ECHOCARDIOGRAM (TEE);  Surgeon: Dolores Patty, MD;  Location: [facility] ENDOSCOPY;  Service: Cardiovascular;  Laterality: [name];   TOTAL HIP ARTHROPLASTY     right    ROS- all systems are reviewed and negative except as per HPI above  Current Outpatient Medications  Medication Sig Dispense Refill   atorvastatin (LIPITOR) 40 MG tablet TAKE 1 TABLET BY MOUTH DAILY 90 tablet 3   metoprolol tartrate (LOPRESSOR) 50 MG tablet TAKE ONE TABLET BY MOUTH TWICE DAILY 180 tablet 2   No current facility-administered medications for this visit.    Physical Exam: Vitals:   07/27/21 1521  BP: 138/60  Pulse: 81  SpO2: 99%  Weight: 196 lb 9.6 oz (89.2 kg)   Height: 5\' 9"  (1.753 m)    GEN- The patient is elderly and frail  appearing, alert and oriented x 3 today.  In a wheelchair today Head- normocephalic, atraumatic Eyes-  Sclera clear, conjunctiva pink Ears- hearing intact Oropharynx- clear Lungs-  normal work of breathing Chest- pacemaker pocket is well healed Heart- Regular rate and rhythm with ectopy GI- soft  Extremities- ++ edema  Pacemaker interrogation- reviewed in detail today,  See PACEART report  ekg tracing ordered today is personally reviewed and shows sinus with demand atrial and ventricular pacing, PACs, PVCs  Assessment and Plan:  1. Symptomatic sinus bradycardia, second degree AV block Normal pacemaker function See Pace Art report No changes today he is not device dependant today  2. Paroxysmal atrial fibrillation Maintaining sinus off amiodarone eliquis has been discontinued following fall 2021.  He has declined 2022. AF burden is 8%  3. Venous insufficiency We discussed support hose and leg elevation as options today  Return to see EP APP in a year  Youth worker MD, [facility] 07/27/2021 3:34 PM

## 2021-07-27 NOTE — Patient Instructions (Addendum)
Medication Instructions:  Your physician recommends that you continue on your current medications as directed. Please refer to the Current Medication list given to you today. *If you need a refill on your cardiac medications before your next appointment, please call your pharmacy*  Lab Work: None. If you have labs (blood work) drawn today and your tests are completely normal, you will receive your results only by: MyChart Message (if you have MyChart) OR A paper copy in the mail If you have any lab test that is abnormal or we need to change your treatment, we will call you to review the results.  Testing/Procedures: None.  Follow-Up: At [facility], you and your health needs are our priority.  As part of our continuing mission to provide you with exceptional heart care, we have created designated Provider Care Teams.  These Care Teams include your primary Cardiologist (physician) and Advanced Practice Providers (APPs -  Physician Assistants and Nurse Practitioners) who all work together to provide you with the care you need, when you need it.  Your physician wants you to follow-up in: 12 months with  Hillis Range, MD or one of the following Advanced Practice Providers on your designated Care Team:    Francis Dowse, PA-C    You will receive a reminder letter in the mail two months in advance. If you don't receive a letter, please call our office to schedule the follow-up appointment.  Remote monitoring is used to monitor your Pacemaker from home. This monitoring reduces the number of office visits required to check your device to one time per year. It allows [place] to keep an eye on the functioning of your device to ensure it is working properly. You are scheduled for a device check from home on 09/30/21. You may send your transmission at any time that day. If you have a wireless device, the transmission will be sent automatically. After your physician reviews your transmission, you will receive a  postcard with your next transmission date.  We recommend signing up for the patient portal called "MyChart".  Sign up information is provided on this After Visit Summary.  MyChart is used to connect with patients for Virtual Visits (Telemedicine).  Patients are able to view lab/test results, encounter notes, upcoming appointments, etc.  Non-urgent messages can be sent to your provider as well.   To learn more about what you can do with MyChart, go to [email or web address].    Any Other Special Instructions Will Be Listed Below (If Applicable).

## 2021-08-24 ENCOUNTER — Other Ambulatory Visit: Payer: Self-pay | Admitting: Cardiovascular Disease

## 2021-09-22 ENCOUNTER — Telehealth: Payer: Self-pay

## 2021-09-22 NOTE — Telephone Encounter (Addendum)
[facility] alert for exceed AT/AF Presenting rhythm AF/flutter ongoing from 1/29 @ 17:38, poor rate control Hx of PAF with RVR, Metoprolol, OAC contraindicated secondary to age/falls Burden 21% Route to triage per protocol [place]  Successful telephone encounter to patient and son, Justin Baker (on DPR) to follow up on s/s of AF with intermittent RVR. Patient denies worsening shortness of breath, hx of COPD,  but does endorse sever lower extremity edema and unable to put on shoes or socks. He utilizes his wheelchair for mobilization secondary to fall risk and lack of lower extremity strength. He is taking his metoprolol as prescribed. No OAC as indicated above. Discuss AF clinic referral for rate control. Patient and son agreeable. Spoke with [name], RN who advised of 10 am appointment with R. Fenton, [place]. Son provided appointment time, directions to AF clinic and parking code. ED precautions given.

## 2021-09-23 ENCOUNTER — Ambulatory Visit (HOSPITAL_COMMUNITY)
Admission: RE | Admit: 2021-09-23 | Discharge: 2021-09-23 | Disposition: A | Discharge status: Home / Self Care | Payer: Medicare HMO | Source: Ambulatory Visit | Attending: Physician Assistant | Admitting: Physician Assistant

## 2021-09-23 ENCOUNTER — Encounter (HOSPITAL_COMMUNITY): Payer: Self-pay | Admitting: Physician Assistant

## 2021-09-23 ENCOUNTER — Other Ambulatory Visit: Payer: Self-pay

## 2021-09-23 VITALS — BP 148/90 | HR 98 | Ht 69.0 in | Wt 196.2 lb

## 2021-09-23 DIAGNOSIS — J449 Chronic obstructive pulmonary disease, unspecified: Secondary | ICD-10-CM | POA: Insufficient documentation

## 2021-09-23 DIAGNOSIS — Z7901 Long term (current) use of anticoagulants: Secondary | ICD-10-CM | POA: Insufficient documentation

## 2021-09-23 DIAGNOSIS — Z950 Presence of cardiac pacemaker: Secondary | ICD-10-CM | POA: Insufficient documentation

## 2021-09-23 DIAGNOSIS — Z8673 Personal history of transient ischemic attack (TIA), and cerebral infarction without residual deficits: Secondary | ICD-10-CM | POA: Insufficient documentation

## 2021-09-23 DIAGNOSIS — D6869 Other thrombophilia: Secondary | ICD-10-CM

## 2021-09-23 DIAGNOSIS — Z79899 Other long term (current) drug therapy: Secondary | ICD-10-CM | POA: Insufficient documentation

## 2021-09-23 DIAGNOSIS — R600 Localized edema: Secondary | ICD-10-CM | POA: Insufficient documentation

## 2021-09-23 DIAGNOSIS — I4819 Other persistent atrial fibrillation: Secondary | ICD-10-CM | POA: Insufficient documentation

## 2021-09-23 DIAGNOSIS — R001 Bradycardia, unspecified: Secondary | ICD-10-CM | POA: Insufficient documentation

## 2021-09-23 MED ORDER — METOPROLOL TARTRATE 25 MG PO TABS: 25.0000 mg | ORAL_TABLET | Freq: Two times a day (BID) | ORAL | 3 refills | Status: DC

## 2021-09-23 MED ORDER — FUROSEMIDE 20 MG PO TABS: ORAL_TABLET | ORAL | 0 refills | Status: DC

## 2021-09-23 NOTE — Addendum Note (Signed)
Encounter addended by: Oliver Barre, PA on: 09/23/2021 4:34 PM  Actions taken: Clinical Note Signed

## 2021-09-23 NOTE — Progress Notes (Addendum)
Primary Care Physician: Patient, No Pcp Per (Inactive) Primary Cardiologist: Dr Johnsie Cancel Primary Electrophysiologist: Dr Rayann Heman Referring Physician: Device clinic   Justin Baker is a 88 y.o. male with a history of symptomatic bradycardia s/p PPM, traumatic SDH, COPD, CVA, atrial fibrillation who presents for follow up in the [facility]. Patient has a CHADS2VASC score of 4 but his Eliquis was discontinued after his SDH in 2021. The device clinic received an alert for an ongoing afib episode starting 09/20/21. Patient has noticed more foot and ankle swelling recently. He has intermittent palpitations. There were no specific triggers that he could identify.   Today, he denies symptoms of chest pain, orthopnea, PND, dizziness, presyncope, syncope, snoring, daytime somnolence, bleeding, or neurologic sequela. The patient is tolerating medications without difficulties and is otherwise without complaint today.    Atrial Fibrillation Risk Factors:  he does not have symptoms or diagnosis of sleep apnea. he does not have a history of rheumatic fever.   he has a BMI of Body mass index is 28.97 kg/m.Marland Kitchen Filed Weights   09/23/21 0944  Weight: 89 kg    Family History  Problem Relation Age of Onset   Tuberculosis Father    Other Unknown        No known heart disease     Atrial Fibrillation Management history:  Previous antiarrhythmic drugs: amiodarone  Previous cardioversions: 2013 Previous ablations: none CHADS2VASC score: 4 Anticoagulation history: Eliquis   Past Medical History:  Diagnosis Date   Atrial fibrillation (HCC)    Back pain    COPD (chronic obstructive pulmonary disease) (HCC)    self diagnosed   Osteoarthritis    s/p right total hip   Shoulder pain, bilateral    Stroke ([place])    Tachy-brady syndrome ([place])    a. s/p STJ dual chamber PPM    Thrombocytopenia ([place])    Past Surgical History:  Procedure Laterality Date   CARDIOVERSION   05/17/2012   Procedure: CARDIOVERSION;  Surgeon: Jolaine Artist, MD;  Location: [facility];  Service: Cardiovascular;  Laterality: [name];   CATARACT EXTRACTION W/ INTRAOCULAR LENS IMPLANT     right   EP IMPLANTABLE DEVICE [name] 11/12/2015   SJM Assurity MRI DR PPM implanted by Dr Caryl Comes for tachy/brady syndrome   TEE WITHOUT CARDIOVERSION  05/17/2012   Procedure: TRANSESOPHAGEAL ECHOCARDIOGRAM (TEE);  Surgeon: Jolaine Artist, MD;  Location: [facility] ENDOSCOPY;  Service: Cardiovascular;  Laterality: [name];   TOTAL HIP ARTHROPLASTY     right    Current Outpatient Medications  Medication Sig Dispense Refill   atorvastatin (LIPITOR) 40 MG tablet TAKE 1 TABLET BY MOUTH DAILY 90 tablet 3   furosemide (LASIX) 20 MG tablet Take 1 tablet by mouth daily for 3 days then only as needed for weight gain/swelling 15 tablet 0   metoprolol tartrate (LOPRESSOR) 25 MG tablet Take 1 tablet (25 mg total) by mouth 2 (two) times daily. In addition to 50mg  twice a day to equal 75mg  twice a day 60 tablet 3   metoprolol tartrate (LOPRESSOR) 50 MG tablet TAKE ONE TABLET BY MOUTH TWICE DAILY 180 tablet 3   Misc Natural Products (OSTEO BI-FLEX ADV JOINT SHIELD) TABS Take by mouth.     No current facility-administered medications for this encounter.    No Known Allergies  Social History   Socioeconomic History   Marital status: Married    Spouse name: Not on file   Number of children: Not on file   Years  of education: Not on file   Highest education level: Not on file  Occupational History   Not on file  Tobacco Use   Smoking status: Former   Smokeless tobacco: Former    Quit date: 08/23/1976   Tobacco comments:    Former smoker 09/24/21  Vaping Use   Vaping Use: Never used  Substance and Sexual Activity   Alcohol use: No   Drug use: No   Sexual activity: Not on file  Other Topics Concern   Not on file  Social History Narrative   Not on file   Social Determinants of Health   Financial Resource  Strain: Not on file  Food Insecurity: Not on file  Transportation Needs: Not on file  Physical Activity: Not on file  Stress: Not on file  Social Connections: Not on file  Intimate Partner Violence: Not on file     ROS- All systems are reviewed and negative except as per the HPI above.  Physical Exam: Vitals:   09/23/21 0944  BP: (!) 148/90  Pulse: 98  Weight: 89 kg  Height: 5\' 9"  (1.753 m)    GEN- The patient is a well appearing elderly male, alert and oriented x 3 today.   Head- normocephalic, atraumatic Eyes-  Sclera clear, conjunctiva pink Ears- hearing intact Oropharynx- clear Neck- supple  Lungs- Clear to ausculation bilaterally, normal work of breathing Heart- irregular rate and rhythm, no murmurs, rubs or gallops  GI- soft, NT, ND, + BS Extremities- no clubbing, cyanosis. 1-2+ bilateral edema. MS- no significant deformity or atrophy Skin- no rash or lesion Psych- euthymic mood, full affect Neuro- strength and sensation are intact  Wt Readings from Last 3 Encounters:  09/23/21 89 kg  07/27/21 89.2 kg  04/15/21 86.2 kg    EKG today demonstrates  Afib with intermittent V pacing  Vent. rate 98 BPM PR interval * ms QRS duration 82 ms QT/QTcB 298/380 ms  Echo 11/13/15 demonstrated  Procedure narrative: Transthoracic echocardiography. Image    quality was poor. The study was technically difficult, as a    result of restricted patient mobility.  - Left ventricle: The cavity size was normal. Wall thickness was    increased in a pattern of mild LVH. Systolic function was normal.    The estimated ejection fraction was in the range of 60% to 65%.    Images were inadequate for LV wall motion assessment. The study    is not technically sufficient to allow evaluation of LV diastolic    function.  - Left atrium: The atrium was normal in size.  - Tricuspid valve: There was trivial regurgitation.  - Pulmonary arteries: PA peak pressure: 25 mm Hg (S) + RAP.  -  Systemic veins: Not visualized.   Impressions:   - Compared to the prior echo in 2013, EF has improved to 60-65%.   Epic records are reviewed at length today  CHA2DS2-VASc Score = 4  The patient's score is based upon: CHF History: 0 HTN History: 0 Diabetes History: 0 Stroke History: 2 Vascular Disease History: 0 Age Score: 2 Gender Score: 0       ASSESSMENT AND PLAN: 1. Persistent Atrial Fibrillation (ICD10:  I48.19) The patient's CHA2DS2-VASc score is 4, indicating a 4.8% annual risk of stroke.   Rhythm control options are limited as he is not on anticoagulation.  Recall he failed amiodarone with abnormal PFTs Will increase metoprolol to 75 mg BID  2. Secondary Hypercoagulable State (ICD10:  [id number]) The patient is  at significant risk for stroke/thromboembolism based upon his CHA2DS2-VASc Score of 4.  However, the patient is not on anticoagulation due to his high bleeding risk.     3. Symptomatic sinus bradycardia S/p PPM, followed by the device clinic.  4. Lower extremity edema Will start Lasix 20 mg x 3 days, then PRN thereafter Check bmet at follow up.    Follow up in the AF clinic in 2 weeks.    [facility] [address] [place], [place] [address] [phone] 09/23/2021 10:59 AM

## 2021-09-23 NOTE — Patient Instructions (Signed)
Start lasix 20mg  once a day for the next 3 days then only AS NEEDED for weight gain or swelling   Increase metoprolol to 75mg  twice a day

## 2021-09-29 LAB — CUP PACEART REMOTE DEVICE CHECK
Battery Remaining Longevity: 31 mo
Battery Remaining Percentage: 32 %
Battery Voltage: 2.92 V
Brady Statistic AP VP Percent: 91 %
Brady Statistic AP VS Percent: 1 %
Brady Statistic AS VP Percent: 2.7 %
Brady Statistic AS VS Percent: 1 %
Brady Statistic RA Percent Paced: 67 %
Brady Statistic RV Percent Paced: 75 %
Date Time Interrogation Session: 20230207020012
Implantable Lead Implant Date: 20170322
Implantable Lead Implant Date: 20170322
Implantable Lead Location: 753859
Implantable Lead Location: 753860
Implantable Pulse Generator Implant Date: 20170322
Lead Channel Impedance Value: 400 Ohm
Lead Channel Impedance Value: 440 Ohm
Lead Channel Pacing Threshold Amplitude: 0.75 V
Lead Channel Pacing Threshold Amplitude: 0.75 V
Lead Channel Pacing Threshold Pulse Width: 0.5 ms
Lead Channel Pacing Threshold Pulse Width: 0.5 ms
Lead Channel Sensing Intrinsic Amplitude: 2.3 mV
Lead Channel Sensing Intrinsic Amplitude: 7.6 mV
Lead Channel Setting Pacing Amplitude: 2 V
Lead Channel Setting Pacing Amplitude: 2.5 V
Lead Channel Setting Pacing Pulse Width: 0.5 ms
Lead Channel Setting Sensing Sensitivity: 2 mV
Pulse Gen Model: 2272
Pulse Gen Serial Number: 3134370

## 2021-09-30 ENCOUNTER — Ambulatory Visit (INDEPENDENT_AMBULATORY_CARE_PROVIDER_SITE_OTHER): Payer: Medicare HMO

## 2021-09-30 DIAGNOSIS — I495 Sick sinus syndrome: Secondary | ICD-10-CM

## 2021-10-01 NOTE — Telephone Encounter (Signed)
Transmission received 09/29/21 shows  presenting rhythm A/V paced at 62bpm.

## 2021-10-05 ENCOUNTER — Telehealth: Payer: Self-pay | Admitting: Cardiovascular Disease

## 2021-10-05 NOTE — Telephone Encounter (Signed)
[facility] calling about outdated order

## 2021-10-05 NOTE — Telephone Encounter (Signed)
Spoke with [name] and needs order for OT signed from July will await order to be faxed and will get MD to sign ./cy

## 2021-10-05 NOTE — Progress Notes (Signed)
Remote pacemaker transmission.   

## 2021-10-06 ENCOUNTER — Ambulatory Visit (HOSPITAL_COMMUNITY)
Admission: RE | Admit: 2021-10-06 | Discharge: 2021-10-06 | Disposition: A | Discharge status: Home / Self Care | Payer: Medicare HMO | Source: Ambulatory Visit | Attending: Physician Assistant | Admitting: Physician Assistant

## 2021-10-06 ENCOUNTER — Encounter (HOSPITAL_COMMUNITY): Payer: Self-pay | Admitting: Physician Assistant

## 2021-10-06 ENCOUNTER — Other Ambulatory Visit: Payer: Self-pay

## 2021-10-06 VITALS — BP 152/80 | HR 69 | Ht 69.0 in | Wt 198.4 lb

## 2021-10-06 DIAGNOSIS — I4819 Other persistent atrial fibrillation: Secondary | ICD-10-CM | POA: Diagnosis present

## 2021-10-06 DIAGNOSIS — Z8673 Personal history of transient ischemic attack (TIA), and cerebral infarction without residual deficits: Secondary | ICD-10-CM | POA: Insufficient documentation

## 2021-10-06 DIAGNOSIS — Z79899 Other long term (current) drug therapy: Secondary | ICD-10-CM | POA: Insufficient documentation

## 2021-10-06 DIAGNOSIS — D6869 Other thrombophilia: Secondary | ICD-10-CM | POA: Insufficient documentation

## 2021-10-06 DIAGNOSIS — Z950 Presence of cardiac pacemaker: Secondary | ICD-10-CM | POA: Insufficient documentation

## 2021-10-06 DIAGNOSIS — I493 Ventricular premature depolarization: Secondary | ICD-10-CM | POA: Insufficient documentation

## 2021-10-06 DIAGNOSIS — J449 Chronic obstructive pulmonary disease, unspecified: Secondary | ICD-10-CM | POA: Diagnosis not present

## 2021-10-06 LAB — BASIC METABOLIC PANEL
Anion gap: 7 (ref 5–15)
BUN: 20 mg/dL (ref 8–23)
CO2: 27 mmol/L (ref 22–32)
Calcium: 8.8 mg/dL — ABNORMAL LOW (ref 8.9–10.3)
Chloride: 109 mmol/L (ref 98–111)
Creatinine, Ser: 1.42 mg/dL — ABNORMAL HIGH (ref 0.61–1.24)
GFR, Estimated: 45 mL/min — ABNORMAL LOW (ref >60)
Glucose, Bld: 128 mg/dL — ABNORMAL HIGH (ref 70–99)
Potassium: 3.6 mmol/L (ref 3.5–5.1)
Sodium: 143 mmol/L (ref 135–145)

## 2021-10-06 NOTE — Progress Notes (Signed)
Primary Care Physician: Patient, No Pcp Per (Inactive) Primary Cardiologist: Dr Johnsie Cancel Primary Electrophysiologist: Dr Rayann Heman Referring Physician: Device clinic   Amori Mcnabb is a 88 y.o. male with a history of symptomatic bradycardia s/p PPM, traumatic SDH, COPD, CVA, atrial fibrillation who presents for follow up in the [facility]. Patient has a CHADS2VASC score of 4 but his Eliquis was discontinued after his SDH in 2021. The device clinic received an alert for an ongoing afib episode starting 09/20/21. Patient has noticed more foot and ankle swelling recently. He has intermittent palpitations. There were no specific triggers that he could identify.   On follow up today, patient reports that about one week ago he felt that he converted back to SR. He does feel improved overall. His lower extremity edema has improved.   Today, he denies symptoms of palpitations, chest pain, orthopnea, PND, dizziness, presyncope, syncope, snoring, daytime somnolence, bleeding, or neurologic sequela. The patient is tolerating medications without difficulties and is otherwise without complaint today.    Atrial Fibrillation Risk Factors:  he does not have symptoms or diagnosis of sleep apnea. he does not have a history of rheumatic fever.   he has a BMI of Body mass index is 29.3 kg/m.Marland Kitchen Filed Weights   10/06/21 1325  Weight: 90 kg     Family History  Problem Relation Age of Onset   Tuberculosis Father    Other Unknown        No known heart disease     Atrial Fibrillation Management history:  Previous antiarrhythmic drugs: amiodarone  Previous cardioversions: 2013 Previous ablations: none CHADS2VASC score: 4 Anticoagulation history: Eliquis   Past Medical History:  Diagnosis Date   Atrial fibrillation (HCC)    Back pain    COPD (chronic obstructive pulmonary disease) (HCC)    self diagnosed   Osteoarthritis    s/p right total hip   Shoulder pain,  bilateral    Stroke ([place])    Tachy-brady syndrome ([place])    a. s/p STJ dual chamber PPM    Thrombocytopenia ([place])    Past Surgical History:  Procedure Laterality Date   CARDIOVERSION  05/17/2012   Procedure: CARDIOVERSION;  Surgeon: Jolaine Artist, MD;  Location: [facility];  Service: Cardiovascular;  Laterality: [name];   CATARACT EXTRACTION W/ INTRAOCULAR LENS IMPLANT     right   EP IMPLANTABLE DEVICE [name] 11/12/2015   SJM Assurity MRI DR PPM implanted by Dr Caryl Comes for tachy/brady syndrome   TEE WITHOUT CARDIOVERSION  05/17/2012   Procedure: TRANSESOPHAGEAL ECHOCARDIOGRAM (TEE);  Surgeon: Jolaine Artist, MD;  Location: [facility] ENDOSCOPY;  Service: Cardiovascular;  Laterality: [name];   TOTAL HIP ARTHROPLASTY     right    Current Outpatient Medications  Medication Sig Dispense Refill   atorvastatin (LIPITOR) 40 MG tablet TAKE 1 TABLET BY MOUTH DAILY 90 tablet 3   furosemide (LASIX) 20 MG tablet Take 1 tablet by mouth daily for 3 days then only as needed for weight gain/swelling 15 tablet 0   metoprolol tartrate (LOPRESSOR) 25 MG tablet Take 1 tablet (25 mg total) by mouth 2 (two) times daily. In addition to 50mg  twice a day to equal 75mg  twice a day 60 tablet 3   metoprolol tartrate (LOPRESSOR) 50 MG tablet TAKE ONE TABLET BY MOUTH TWICE DAILY 180 tablet 3   Misc Natural Products (OSTEO BI-FLEX ADV JOINT SHIELD) TABS Take by mouth.     No current facility-administered medications for this encounter.  No Known Allergies  Social History   Socioeconomic History   Marital status: Married    Spouse name: Not on file   Number of children: Not on file   Years of education: Not on file   Highest education level: Not on file  Occupational History   Not on file  Tobacco Use   Smoking status: Former   Smokeless tobacco: Former    Quit date: 08/23/1976   Tobacco comments:    Former smoker 09/24/21  Vaping Use   Vaping Use: Never used  Substance and Sexual Activity   Alcohol use: No    Drug use: No   Sexual activity: Not on file  Other Topics Concern   Not on file  Social History Narrative   Not on file   Social Determinants of Health   Financial Resource Strain: Not on file  Food Insecurity: Not on file  Transportation Needs: Not on file  Physical Activity: Not on file  Stress: Not on file  Social Connections: Not on file  Intimate Partner Violence: Not on file     ROS- All systems are reviewed and negative except as per the HPI above.  Physical Exam: Vitals:   10/06/21 1325  BP: (!) 152/80  Pulse: 69  Weight: 90 kg  Height: 5\' 9"  (1.753 m)    GEN- The patient is a well appearing elderly male, alert and oriented x 3 today.   HEENT-head normocephalic, atraumatic, sclera clear, conjunctiva pink, hearing intact, trachea midline. Lungs- Clear to ausculation bilaterally, normal work of breathing Heart- Regular rate and rhythm, no murmurs, rubs or gallops  GI- soft, NT, ND, + BS Extremities- no clubbing, cyanosis. 1+ bilateral edema MS- no significant deformity or atrophy Skin- no rash or lesion Psych- euthymic mood, full affect Neuro- strength and sensation are intact   Wt Readings from Last 3 Encounters:  10/06/21 90 kg  09/23/21 89 kg  07/27/21 89.2 kg    EKG today demonstrates  AV dual paced rhythm, PVCs Vent. rate 69 BPM PR interval * ms QRS duration 174 ms QT/QTcB 470/503 ms  Echo 11/13/15 demonstrated  Procedure narrative: Transthoracic echocardiography. Image    quality was poor. The study was technically difficult, as a    result of restricted patient mobility.  - Left ventricle: The cavity size was normal. Wall thickness was    increased in a pattern of mild LVH. Systolic function was normal.    The estimated ejection fraction was in the range of 60% to 65%.    Images were inadequate for LV wall motion assessment. The study    is not technically sufficient to allow evaluation of LV diastolic    function.  - Left atrium: The  atrium was normal in size.  - Tricuspid valve: There was trivial regurgitation.  - Pulmonary arteries: PA peak pressure: 25 mm Hg (S) + RAP.  - Systemic veins: Not visualized.   Impressions:   - Compared to the prior echo in 2013, EF has improved to 60-65%.   Epic records are reviewed at length today  CHA2DS2-VASc Score = 4  The patient's score is based upon: CHF History: 0 HTN History: 0 Diabetes History: 0 Stroke History: 2 Vascular Disease History: 0 Age Score: 2 Gender Score: 0       ASSESSMENT AND PLAN: 1. Persistent Atrial Fibrillation (ICD10:  I48.19) The patient's CHA2DS2-VASc score is 4, indicating a 4.8% annual risk of stroke.   Rhythm control options are limited as he is not  on anticoagulation.  Recall he failed amiodarone with abnormal PFTs He has converted to SR. Continue metoprolol 75 mg BID  2. Secondary Hypercoagulable State (ICD10:  D68.69) The patient is at significant risk for stroke/thromboembolism based upon his CHA2DS2-VASc Score of 4.  However, the patient is not on anticoagulation due to his high bleeding risk.     3. Symptomatic sinus bradycardia S/p PPM, followed by the device clinic.  4. Lower extremity edema Improved, continue Lasix PRN Check bmet today.   Follow up with EP APP per recall. AF clinic as needed.    [facility] [address] [place], [place] [address] [phone] 10/06/2021 1:35 PM

## 2021-10-12 NOTE — Telephone Encounter (Signed)
Re faxed signed orders for 3rd time. Received confirmation that fax went through to number provided [phone].

## 2021-12-28 ENCOUNTER — Telehealth: Payer: Self-pay

## 2021-12-28 NOTE — Telephone Encounter (Signed)
Spoke with patient's son, patient is HOH per son Regarding alert from [employer]  ? ?Alert remote reviewed. Normal device function.   ?Known AF, not on OAC, contraindicated due to falls, AF burden is 19% of the time, not always good ventricular rate control as HRs are greater than 110 bpm much of time when in AF.  Sent to triage due to ongoing AF.  ?Next remote 12/30/2021. ? ?Son asked patient if he felt like his heart was racing patient stated he felt fine asked son to send a manual transmission next Monday 01/04/22 to check AF/ Heart rate Patients sone voiced understanding, verbal instructions given on how to send transmission  ?

## 2021-12-30 ENCOUNTER — Ambulatory Visit (INDEPENDENT_AMBULATORY_CARE_PROVIDER_SITE_OTHER): Payer: Medicare HMO

## 2021-12-30 DIAGNOSIS — I495 Sick sinus syndrome: Secondary | ICD-10-CM | POA: Diagnosis not present

## 2021-12-30 LAB — CUP PACEART REMOTE DEVICE CHECK
Battery Remaining Longevity: 27 mo
Battery Remaining Percentage: 29 %
Battery Voltage: 2.92 V
Brady Statistic AP VP Percent: 94 %
Brady Statistic AP VS Percent: 1 %
Brady Statistic AS VP Percent: 1.9 %
Brady Statistic AS VS Percent: 1 %
Brady Statistic RA Percent Paced: 71 %
Brady Statistic RV Percent Paced: 77 %
Date Time Interrogation Session: 20230510044410
Implantable Lead Implant Date: 20170322
Implantable Lead Implant Date: 20170322
Implantable Lead Location: 753859
Implantable Lead Location: 753860
Implantable Pulse Generator Implant Date: 20170322
Lead Channel Impedance Value: 390 Ohm
Lead Channel Impedance Value: 430 Ohm
Lead Channel Pacing Threshold Amplitude: 0.75 V
Lead Channel Pacing Threshold Amplitude: 0.75 V
Lead Channel Pacing Threshold Pulse Width: 0.5 ms
Lead Channel Pacing Threshold Pulse Width: 0.5 ms
Lead Channel Sensing Intrinsic Amplitude: 2.1 mV
Lead Channel Sensing Intrinsic Amplitude: 7.5 mV
Lead Channel Setting Pacing Amplitude: 2 V
Lead Channel Setting Pacing Amplitude: 2.5 V
Lead Channel Setting Pacing Pulse Width: 0.5 ms
Lead Channel Setting Sensing Sensitivity: 2 mV
Pulse Gen Model: 2272
Pulse Gen Serial Number: 3134370

## 2022-01-04 ENCOUNTER — Telehealth: Payer: Self-pay

## 2022-01-04 ENCOUNTER — Ambulatory Visit (INDEPENDENT_AMBULATORY_CARE_PROVIDER_SITE_OTHER): Payer: Medicare HMO

## 2022-01-04 DIAGNOSIS — I495 Sick sinus syndrome: Secondary | ICD-10-CM | POA: Diagnosis not present

## 2022-01-04 NOTE — Telephone Encounter (Signed)
[facility] alert for AT/AF ?AF/flutter  event in progress, clinic aware, pt asymptomatic, request for transmission 5/15 ?Burden 23%, not always good rate control, Metoprolol, OAC contraindicated ?Route to triage ?LA  ? ?Successful telephone encounter to patient to follow up on s/s of ongoing AF./AFL. Patient states he "feels pretty good". Denies symptoms of shortness of breath, palpitations, dizziness/lightheadedness, or increased tiredness. Patient recalls appointment with AF clinic in 09/2021 and states he has AF contact. He will request appointment with AF clinic if symptoms arise. For now watchful waiting.  ? ? ? ? ? ? ? ? ? ?

## 2022-01-04 NOTE — Telephone Encounter (Signed)
Pt son Justin Baker states the nurse called the patient but the patient does not hear well. Justin Baker will like for the nurse to call him at [phone]. ?

## 2022-01-04 NOTE — Telephone Encounter (Signed)
Returning son's Loraine Leriche) call. Reviewed conversation had with patient (see previous note). Loraine Leriche confirms he has contact to AF clinic if patient should develop symptoms. Son appreciative of follow up.  ?

## 2022-01-07 LAB — CUP PACEART REMOTE DEVICE CHECK
Battery Remaining Longevity: 25 mo
Battery Remaining Percentage: 28 %
Battery Voltage: 2.92 V
Brady Statistic AP VP Percent: 94 %
Brady Statistic AP VS Percent: 1 %
Brady Statistic AS VP Percent: 1.9 %
Brady Statistic AS VS Percent: 1 %
Brady Statistic RA Percent Paced: 69 %
Brady Statistic RV Percent Paced: 75 %
Date Time Interrogation Session: 20230515101816
Implantable Lead Implant Date: 20170322
Implantable Lead Implant Date: 20170322
Implantable Lead Location: 753859
Implantable Lead Location: 753860
Implantable Pulse Generator Implant Date: 20170322
Lead Channel Impedance Value: 390 Ohm
Lead Channel Impedance Value: 430 Ohm
Lead Channel Pacing Threshold Amplitude: 0.75 V
Lead Channel Pacing Threshold Amplitude: 0.75 V
Lead Channel Pacing Threshold Pulse Width: 0.5 ms
Lead Channel Pacing Threshold Pulse Width: 0.5 ms
Lead Channel Sensing Intrinsic Amplitude: 3.8 mV
Lead Channel Sensing Intrinsic Amplitude: 7.2 mV
Lead Channel Setting Pacing Amplitude: 2 V
Lead Channel Setting Pacing Amplitude: 2.5 V
Lead Channel Setting Pacing Pulse Width: 0.5 ms
Lead Channel Setting Sensing Sensitivity: 2 mV
Pulse Gen Model: 2272
Pulse Gen Serial Number: 3134370

## 2022-01-07 NOTE — Progress Notes (Signed)
Remote pacemaker transmission.   

## 2022-01-18 ENCOUNTER — Other Ambulatory Visit (HOSPITAL_COMMUNITY): Payer: Self-pay | Admitting: Physician Assistant

## 2022-01-21 NOTE — Progress Notes (Signed)
Remote pacemaker transmission.   

## 2022-03-31 ENCOUNTER — Ambulatory Visit (INDEPENDENT_AMBULATORY_CARE_PROVIDER_SITE_OTHER): Payer: Medicare HMO

## 2022-03-31 DIAGNOSIS — I495 Sick sinus syndrome: Secondary | ICD-10-CM | POA: Diagnosis not present

## 2022-03-31 LAB — CUP PACEART REMOTE DEVICE CHECK
Battery Remaining Longevity: 23 mo
Battery Remaining Percentage: 26 %
Battery Voltage: 2.9 V
Brady Statistic AP VP Percent: 96 %
Brady Statistic AP VS Percent: 1 %
Brady Statistic AS VP Percent: 1.6 %
Brady Statistic AS VS Percent: 1 %
Brady Statistic RA Percent Paced: 70 %
Brady Statistic RV Percent Paced: 75 %
Date Time Interrogation Session: 20230809052429
Implantable Lead Implant Date: 20170322
Implantable Lead Implant Date: 20170322
Implantable Lead Location: 753859
Implantable Lead Location: 753860
Implantable Pulse Generator Implant Date: 20170322
Lead Channel Impedance Value: 390 Ohm
Lead Channel Impedance Value: 450 Ohm
Lead Channel Pacing Threshold Amplitude: 0.75 V
Lead Channel Pacing Threshold Amplitude: 0.75 V
Lead Channel Pacing Threshold Pulse Width: 0.5 ms
Lead Channel Pacing Threshold Pulse Width: 0.5 ms
Lead Channel Sensing Intrinsic Amplitude: 2.1 mV
Lead Channel Sensing Intrinsic Amplitude: 5.9 mV
Lead Channel Setting Pacing Amplitude: 2 V
Lead Channel Setting Pacing Amplitude: 2.5 V
Lead Channel Setting Pacing Pulse Width: 0.5 ms
Lead Channel Setting Sensing Sensitivity: 2 mV
Pulse Gen Model: 2272
Pulse Gen Serial Number: 3134370

## 2022-05-03 NOTE — Progress Notes (Signed)
Remote pacemaker transmission.   

## 2022-05-18 ENCOUNTER — Emergency Department (HOSPITAL_BASED_OUTPATIENT_CLINIC_OR_DEPARTMENT_OTHER): Payer: Medicare HMO

## 2022-05-18 ENCOUNTER — Other Ambulatory Visit: Payer: Self-pay

## 2022-05-18 ENCOUNTER — Encounter (HOSPITAL_COMMUNITY): Payer: Self-pay

## 2022-05-18 ENCOUNTER — Inpatient Hospital Stay (HOSPITAL_BASED_OUTPATIENT_CLINIC_OR_DEPARTMENT_OTHER)
Admission: EM | Admit: 2022-05-18 | Discharge: 2022-05-25 | DRG: 871 | Disposition: A | Discharge status: Skilled Nursing Facility | Payer: Medicare HMO | Attending: Internal Medicine | Admitting: Internal Medicine

## 2022-05-18 ENCOUNTER — Encounter (HOSPITAL_BASED_OUTPATIENT_CLINIC_OR_DEPARTMENT_OTHER): Payer: Self-pay

## 2022-05-18 DIAGNOSIS — Z515 Encounter for palliative care: Secondary | ICD-10-CM

## 2022-05-18 DIAGNOSIS — N179 Acute kidney failure, unspecified: Secondary | ICD-10-CM | POA: Diagnosis present

## 2022-05-18 DIAGNOSIS — A419 Sepsis, unspecified organism: Secondary | ICD-10-CM | POA: Diagnosis not present

## 2022-05-18 DIAGNOSIS — Z79899 Other long term (current) drug therapy: Secondary | ICD-10-CM

## 2022-05-18 DIAGNOSIS — H109 Unspecified conjunctivitis: Secondary | ICD-10-CM | POA: Diagnosis present

## 2022-05-18 DIAGNOSIS — J690 Pneumonitis due to inhalation of food and vomit: Secondary | ICD-10-CM | POA: Diagnosis present

## 2022-05-18 DIAGNOSIS — N3090 Cystitis, unspecified without hematuria: Secondary | ICD-10-CM | POA: Diagnosis present

## 2022-05-18 DIAGNOSIS — R1310 Dysphagia, unspecified: Secondary | ICD-10-CM | POA: Diagnosis present

## 2022-05-18 DIAGNOSIS — Z87891 Personal history of nicotine dependence: Secondary | ICD-10-CM

## 2022-05-18 DIAGNOSIS — Z96641 Presence of right artificial hip joint: Secondary | ICD-10-CM | POA: Diagnosis present

## 2022-05-18 DIAGNOSIS — Z66 Do not resuscitate: Secondary | ICD-10-CM | POA: Diagnosis present

## 2022-05-18 DIAGNOSIS — R931 Abnormal findings on diagnostic imaging of heart and coronary circulation: Secondary | ICD-10-CM | POA: Diagnosis present

## 2022-05-18 DIAGNOSIS — Z831 Family history of other infectious and parasitic diseases: Secondary | ICD-10-CM | POA: Diagnosis not present

## 2022-05-18 DIAGNOSIS — I495 Sick sinus syndrome: Secondary | ICD-10-CM | POA: Diagnosis present

## 2022-05-18 DIAGNOSIS — Z8673 Personal history of transient ischemic attack (TIA), and cerebral infarction without residual deficits: Secondary | ICD-10-CM | POA: Diagnosis not present

## 2022-05-18 DIAGNOSIS — J9601 Acute respiratory failure with hypoxia: Secondary | ICD-10-CM | POA: Diagnosis present

## 2022-05-18 DIAGNOSIS — R6520 Severe sepsis without septic shock: Secondary | ICD-10-CM | POA: Diagnosis present

## 2022-05-18 DIAGNOSIS — K5792 Diverticulitis of intestine, part unspecified, without perforation or abscess without bleeding: Secondary | ICD-10-CM | POA: Diagnosis present

## 2022-05-18 DIAGNOSIS — N390 Urinary tract infection, site not specified: Secondary | ICD-10-CM | POA: Diagnosis present

## 2022-05-18 DIAGNOSIS — Z1152 Encounter for screening for COVID-19: Secondary | ICD-10-CM

## 2022-05-18 DIAGNOSIS — E876 Hypokalemia: Secondary | ICD-10-CM | POA: Diagnosis present

## 2022-05-18 DIAGNOSIS — E860 Dehydration: Secondary | ICD-10-CM | POA: Diagnosis present

## 2022-05-18 DIAGNOSIS — G9340 Encephalopathy, unspecified: Secondary | ICD-10-CM | POA: Diagnosis present

## 2022-05-18 DIAGNOSIS — Z950 Presence of cardiac pacemaker: Secondary | ICD-10-CM

## 2022-05-18 DIAGNOSIS — J189 Pneumonia, unspecified organism: Secondary | ICD-10-CM | POA: Diagnosis present

## 2022-05-18 DIAGNOSIS — I5032 Chronic diastolic (congestive) heart failure: Secondary | ICD-10-CM | POA: Diagnosis present

## 2022-05-18 DIAGNOSIS — I4891 Unspecified atrial fibrillation: Secondary | ICD-10-CM | POA: Diagnosis present

## 2022-05-18 DIAGNOSIS — J449 Chronic obstructive pulmonary disease, unspecified: Secondary | ICD-10-CM | POA: Diagnosis present

## 2022-05-18 DIAGNOSIS — N3001 Acute cystitis with hematuria: Secondary | ICD-10-CM

## 2022-05-18 DIAGNOSIS — R7401 Elevation of levels of liver transaminase levels: Secondary | ICD-10-CM | POA: Diagnosis present

## 2022-05-18 DIAGNOSIS — I493 Ventricular premature depolarization: Secondary | ICD-10-CM | POA: Diagnosis present

## 2022-05-18 DIAGNOSIS — N3000 Acute cystitis without hematuria: Secondary | ICD-10-CM | POA: Diagnosis not present

## 2022-05-18 HISTORY — DX: Chest pain, unspecified: R07.9

## 2022-05-18 HISTORY — DX: Traumatic subdural hemorrhage with loss of consciousness status unknown, initial encounter: S06.5XAA

## 2022-05-18 LAB — CBC WITH DIFFERENTIAL/PLATELET
Abs Immature Granulocytes: 0.1 10*3/uL — ABNORMAL HIGH (ref 0.00–0.07)
Basophils Absolute: 0.1 10*3/uL (ref 0.0–0.1)
Basophils Relative: 0 %
Eosinophils Absolute: 0.1 10*3/uL (ref 0.0–0.5)
Eosinophils Relative: 1 %
HCT: 38.9 % — ABNORMAL LOW (ref 39.0–52.0)
Hemoglobin: 13.1 g/dL (ref 13.0–17.0)
Immature Granulocytes: 1 %
Lymphocytes Relative: 13 %
Lymphs Abs: 1.8 10*3/uL (ref 0.7–4.0)
MCH: 33.2 pg (ref 26.0–34.0)
MCHC: 33.7 g/dL (ref 30.0–36.0)
MCV: 98.7 fL (ref 80.0–100.0)
Monocytes Absolute: 1 10*3/uL (ref 0.1–1.0)
Monocytes Relative: 7 %
Neutro Abs: 11 10*3/uL — ABNORMAL HIGH (ref 1.7–7.7)
Neutrophils Relative %: 78 %
Platelets: 185 10*3/uL (ref 150–400)
RBC: 3.94 MIL/uL — ABNORMAL LOW (ref 4.22–5.81)
RDW: 13.2 % (ref 11.5–15.5)
WBC: 14.1 10*3/uL — ABNORMAL HIGH (ref 4.0–10.5)
nRBC: 0 % (ref 0.0–0.2)

## 2022-05-18 LAB — URINALYSIS, ROUTINE W REFLEX MICROSCOPIC
Bilirubin Urine: NEGATIVE
Glucose, UA: NEGATIVE mg/dL
Ketones, ur: NEGATIVE mg/dL
Nitrite: NEGATIVE
Protein, ur: 100 mg/dL — AB
Specific Gravity, Urine: 1.015 (ref 1.005–1.030)
pH: 7 (ref 5.0–8.0)

## 2022-05-18 LAB — RAPID URINE DRUG SCREEN, HOSP PERFORMED
Amphetamines: NOT DETECTED
Barbiturates: NOT DETECTED
Benzodiazepines: NOT DETECTED
Cocaine: NOT DETECTED
Opiates: NOT DETECTED
Tetrahydrocannabinol: NOT DETECTED

## 2022-05-18 LAB — I-STAT VENOUS BLOOD GAS, ED
Acid-Base Excess: 1 mmol/L (ref 0.0–2.0)
Bicarbonate: 26.1 mmol/L (ref 20.0–28.0)
Calcium, Ion: 1.14 mmol/L — ABNORMAL LOW (ref 1.15–1.40)
HCT: 41 % (ref 39.0–52.0)
Hemoglobin: 13.9 g/dL (ref 13.0–17.0)
O2 Saturation: 38 %
Patient temperature: 98.4
Potassium: 3.4 mmol/L — ABNORMAL LOW (ref 3.5–5.1)
Sodium: 139 mmol/L (ref 135–145)
TCO2: 27 mmol/L (ref 22–32)
pCO2, Ven: 44.2 mmHg (ref 44–60)
pH, Ven: 7.379 (ref 7.25–7.43)
pO2, Ven: 23 mmHg — CL (ref 32–45)

## 2022-05-18 LAB — COMPREHENSIVE METABOLIC PANEL
ALT: 52 U/L — ABNORMAL HIGH (ref 0–44)
AST: 83 U/L — ABNORMAL HIGH (ref 15–41)
Albumin: 2.8 g/dL — ABNORMAL LOW (ref 3.5–5.0)
Alkaline Phosphatase: 64 U/L (ref 38–126)
Anion gap: 9 (ref 5–15)
BUN: 26 mg/dL — ABNORMAL HIGH (ref 8–23)
CO2: 25 mmol/L (ref 22–32)
Calcium: 8.1 mg/dL — ABNORMAL LOW (ref 8.9–10.3)
Chloride: 103 mmol/L (ref 98–111)
Creatinine, Ser: 1.98 mg/dL — ABNORMAL HIGH (ref 0.61–1.24)
GFR, Estimated: 30 mL/min — ABNORMAL LOW (ref >60)
Glucose, Bld: 132 mg/dL — ABNORMAL HIGH (ref 70–99)
Potassium: 3.3 mmol/L — ABNORMAL LOW (ref 3.5–5.1)
Sodium: 137 mmol/L (ref 135–145)
Total Bilirubin: 1.5 mg/dL — ABNORMAL HIGH (ref 0.3–1.2)
Total Protein: 6 g/dL — ABNORMAL LOW (ref 6.5–8.1)

## 2022-05-18 LAB — APTT: aPTT: 35 seconds (ref 24–36)

## 2022-05-18 LAB — TSH: TSH: 1.144 u[IU]/mL (ref 0.350–4.500)

## 2022-05-18 LAB — URINALYSIS, MICROSCOPIC (REFLEX)

## 2022-05-18 LAB — AMMONIA: Ammonia: 12 umol/L (ref 9–35)

## 2022-05-18 LAB — RESP PANEL BY RT-PCR (FLU A&B, COVID) ARPGX2
Influenza A by PCR: NEGATIVE
Influenza B by PCR: NEGATIVE
SARS Coronavirus 2 by RT PCR: NEGATIVE

## 2022-05-18 LAB — LACTIC ACID, PLASMA: Lactic Acid, Venous: 1.5 mmol/L (ref 0.5–1.9)

## 2022-05-18 LAB — MAGNESIUM: Magnesium: 1.7 mg/dL (ref 1.7–2.4)

## 2022-05-18 LAB — LIPASE, BLOOD: Lipase: 30 U/L (ref 11–51)

## 2022-05-18 LAB — PROTIME-INR
INR: 1.2 (ref 0.8–1.2)
Prothrombin Time: 15.2 seconds (ref 11.4–15.2)

## 2022-05-18 LAB — BRAIN NATRIURETIC PEPTIDE: B Natriuretic Peptide: 479.2 pg/mL — ABNORMAL HIGH (ref 0.0–100.0)

## 2022-05-18 LAB — TROPONIN I (HIGH SENSITIVITY)
Troponin I (High Sensitivity): 41 ng/L — ABNORMAL HIGH (ref <18)
Troponin I (High Sensitivity): 37 ng/L — ABNORMAL HIGH (ref <18)

## 2022-05-18 LAB — HEPARIN LEVEL (UNFRACTIONATED): Heparin Unfractionated: 0.42 IU/mL (ref 0.30–0.70)

## 2022-05-18 MED ORDER — HEPARIN (PORCINE) 25000 UT/250ML-% IV SOLN
1100.0000 [IU]/h | INTRAVENOUS | Status: DC
  Administered 2022-05-18: 1100 [IU]/h via INTRAVENOUS
  Filled 2022-05-18: qty 250

## 2022-05-18 MED ORDER — SODIUM CHLORIDE 0.9% FLUSH
3.0000 mL | Freq: Two times a day (BID) | INTRAVENOUS | Status: DC
  Administered 2022-05-18 – 2022-05-24 (×5): 3 mL via INTRAVENOUS

## 2022-05-18 MED ORDER — POTASSIUM CHLORIDE CRYS ER 20 MEQ PO TBCR
40.0000 meq | EXTENDED_RELEASE_TABLET | Freq: Once | ORAL | Status: AC
  Administered 2022-05-18: 40 meq via ORAL
  Filled 2022-05-18: qty 2

## 2022-05-18 MED ORDER — SODIUM CHLORIDE 0.9 % IV SOLN
500.0000 mg | INTRAVENOUS | Status: AC
  Administered 2022-05-18 – 2022-05-22 (×5): 500 mg via INTRAVENOUS
  Filled 2022-05-18 (×5): qty 5

## 2022-05-18 MED ORDER — METOPROLOL TARTRATE 50 MG PO TABS: 50.0000 mg | ORAL_TABLET | Freq: Two times a day (BID) | ORAL | Status: DC

## 2022-05-18 MED ORDER — METOPROLOL TARTRATE 50 MG PO TABS
75.0000 mg | ORAL_TABLET | Freq: Two times a day (BID) | ORAL | Status: DC
  Administered 2022-05-18 – 2022-05-25 (×14): 75 mg via ORAL
  Filled 2022-05-18 (×14): qty 1

## 2022-05-18 MED ORDER — ORAL CARE MOUTH RINSE: 15.0000 mL | OROMUCOSAL | Status: DC | PRN

## 2022-05-18 MED ORDER — SODIUM CHLORIDE 0.9 % IV SOLN
2.0000 g | INTRAVENOUS | Status: AC
  Administered 2022-05-18 – 2022-05-22 (×5): 2 g via INTRAVENOUS
  Filled 2022-05-18 (×5): qty 20

## 2022-05-18 MED ORDER — ACETAMINOPHEN 325 MG PO TABS: 650.0000 mg | ORAL_TABLET | Freq: Four times a day (QID) | ORAL | Status: DC | PRN

## 2022-05-18 MED ORDER — METRONIDAZOLE 500 MG/100ML IV SOLN
500.0000 mg | Freq: Once | INTRAVENOUS | Status: AC
  Administered 2022-05-18: 500 mg via INTRAVENOUS
  Filled 2022-05-18: qty 100

## 2022-05-18 MED ORDER — ACETAMINOPHEN 650 MG RE SUPP: 650.0000 mg | Freq: Four times a day (QID) | RECTAL | Status: DC | PRN

## 2022-05-18 MED ORDER — SODIUM CHLORIDE 0.9 % IV BOLUS
500.0000 mL | Freq: Once | INTRAVENOUS | Status: AC
Start: 2022-05-18 — End: 2022-05-18
  Administered 2022-05-18: 500 mL via INTRAVENOUS

## 2022-05-18 MED ORDER — IOHEXOL 350 MG/ML SOLN
80.0000 mL | Freq: Once | INTRAVENOUS | Status: AC | PRN
  Administered 2022-05-18: 80 mL via INTRAVENOUS

## 2022-05-18 MED ORDER — CIPROFLOXACIN HCL 0.3 % OP SOLN
2.0000 [drp] | OPHTHALMIC | Status: DC
  Administered 2022-05-18 – 2022-05-25 (×31): 2 [drp] via OPHTHALMIC
  Filled 2022-05-18 (×2): qty 2.5

## 2022-05-18 MED ORDER — HEPARIN BOLUS VIA INFUSION
4000.0000 [IU] | Freq: Once | INTRAVENOUS | Status: AC
  Administered 2022-05-18: 4000 [IU] via INTRAVENOUS

## 2022-05-18 MED ORDER — METOPROLOL TARTRATE 50 MG PO TABS: 75.0000 mg | ORAL_TABLET | Freq: Two times a day (BID) | ORAL | Status: DC

## 2022-05-18 MED ORDER — GUAIFENESIN 100 MG/5ML PO LIQD
5.0000 mL | Freq: Once | ORAL | Status: AC
  Administered 2022-05-18: 5 mL via ORAL
  Filled 2022-05-18: qty 10

## 2022-05-18 MED ORDER — METOPROLOL TARTRATE 25 MG PO TABS
25.0000 mg | ORAL_TABLET | Freq: Once | ORAL | Status: AC
  Administered 2022-05-18: 25 mg via ORAL
  Filled 2022-05-18: qty 1

## 2022-05-18 MED ORDER — ATORVASTATIN CALCIUM 40 MG PO TABS
40.0000 mg | ORAL_TABLET | Freq: Every day | ORAL | Status: DC
  Administered 2022-05-19 – 2022-05-25 (×7): 40 mg via ORAL
  Filled 2022-05-18 (×7): qty 1

## 2022-05-18 MED ORDER — POLYETHYLENE GLYCOL 3350 17 G PO PACK: 17.0000 g | PACK | Freq: Every day | ORAL | Status: DC | PRN

## 2022-05-18 MED ORDER — METOPROLOL TARTRATE 25 MG PO TABS: 25.0000 mg | ORAL_TABLET | Freq: Two times a day (BID) | ORAL | Status: DC

## 2022-05-18 MED ORDER — SODIUM CHLORIDE 0.9 % IV SOLN: INTRAVENOUS | Status: DC

## 2022-05-18 MED ORDER — ENOXAPARIN SODIUM 30 MG/0.3ML IJ SOSY
30.0000 mg | PREFILLED_SYRINGE | INTRAMUSCULAR | Status: DC
  Administered 2022-05-18 – 2022-05-19 (×2): 30 mg via SUBCUTANEOUS
  Filled 2022-05-18 (×2): qty 0.3

## 2022-05-18 MED ORDER — LACTATED RINGERS IV SOLN: INTRAVENOUS | Status: AC

## 2022-05-18 MED ORDER — METRONIDAZOLE 500 MG/100ML IV SOLN
500.0000 mg | Freq: Two times a day (BID) | INTRAVENOUS | Status: DC
  Administered 2022-05-18 – 2022-05-25 (×14): 500 mg via INTRAVENOUS
  Filled 2022-05-18 (×14): qty 100

## 2022-05-18 MED ORDER — GUAIFENESIN-DM 100-10 MG/5ML PO SYRP
5.0000 mL | ORAL_SOLUTION | ORAL | Status: DC | PRN
  Administered 2022-05-23 (×2): 5 mL via ORAL
  Filled 2022-05-18 (×2): qty 10

## 2022-05-18 MED ORDER — LACTATED RINGERS IV BOLUS (SEPSIS)
500.0000 mL | Freq: Once | INTRAVENOUS | Status: AC
  Administered 2022-05-18: 500 mL via INTRAVENOUS

## 2022-05-18 NOTE — ED Notes (Signed)
RT at bedside to evaluate pt as well.

## 2022-05-18 NOTE — H&P (Signed)
History and Physical   Justin Baker ZOX:[id number] DOB: [birth year] DOA: 05/18/2022  PCP: Patient, No Pcp Per   Patient coming from: Home  Chief Complaint: AMS, Abd Pain, Dysuria, Prod Cough, Dyspnea  HPI: Justin Baker is a 88 y.o. male with medical history significant of atrial fibrillation, sick sinus syndrome status post pacemaker, decreased EF, pain presenting with altered mental status and multiple other complaints.  History obtained with assistance of chart review and family.  Patient has reportedly had a productive cough for the past 1-1/2 to 2 weeks.  This has been gradually worsening and has had intermittent green to brown sputum production.  Also has had some associated intermittent chills but no reported fever.  In addition has had some decreased appetite and decreased p.o. intake.  He is also had some intermittent confusion for the past week.  In addition he has had some nausea and there was some report of abdominal pai but he denied this to me.  For the past several days he is began to experience shortness of breath, dyspnea on exertion, palpitations.  He is also noted to have dysuria and increased urinary frequency for the past several days.  His confusion has worsened and yesterday he was found by family in his living room with his shirt off not sure why sure it was all, leaving a lot of lights on and sleeping all hours of the day which is unusual for him.  He denies chest pain, constipation, diarrhea.  ED Course: Vital signs in the ED significant for heart rate in the 90s to 120s, blood pressure in the 110s 140s systolic, respiratory rate in 81X, requiring 2 L to maintain saturations.  Lab work-up included CMP with potassium 3.3, great creatinine elevated to 1.98 from baseline of 1.5, glucose 132, calcium 8.1, protein 6.0, albumin 2.8, AST 83, ALT is 52, T. bili 1.3.  CBC with leukocytosis to 14.1.  PT, PTT, INR within normal limits.  Lactic acid normal with repeat pending.  Lipase  normal.  TSH normal.  BMP mildly elevated at 479.  Troponin flat at 41 and then 37 on repeat.  Respiratory panel for flu and COVID-negative.  Ammonia level normal.  VBG with normal PCO2 and normal pH.  Urinalysis with hemoglobin, protein, small leukocytes, rare bacteria.  UDS negative.  Blood cultures pending.  Magnesium level normal.  Imaging in the ED included chest x-ray showed mild atelectasis versus scarring at the bases.  CT head showed no acute abnormality.  CTA PE study with a CT of the abdomen pelvis was negative for acute PE, did show lower lobe infiltrates consistent with pneumonia as well as bronchial wall thickening and secretions in the trachea which could indicate aspiration as etiology, there was mild diverticulitis changes noted as well as bladder wall thickening that could be consistent with cystitis.  Patient received ceftriaxone, azithromycin, Flagyl in the ED.  Also given 50 mg p.o. metoprolol, 40 mEq p.o. potassium, 500 cc IV fluids.  Started on heparin drip.  Review of Systems: As per HPI otherwise all other systems reviewed and are negative.  Past Medical History:  Diagnosis Date   ARF (acute renal failure) (HCC) 05/16/2012   Atrial fibrillation (HCC)    Back pain    COPD (chronic obstructive pulmonary disease) (HCC)    self diagnosed   Osteoarthritis    s/p right total hip   Pain in the chest    Shoulder pain, bilateral    Stroke (HCC)    Tachy-brady syndrome (HCC)  a. s/p STJ dual chamber PPM    Thrombocytopenia (HCC)    Viral respiratory illness 07/09/2014    Past Surgical History:  Procedure Laterality Date   CARDIOVERSION  05/17/2012   Procedure: CARDIOVERSION;  Surgeon: Dolores Patty, MD;  Location: [facility] ENDOSCOPY;  Service: Cardiovascular;  Laterality: [name];   CATARACT EXTRACTION W/ INTRAOCULAR LENS IMPLANT     right   EP IMPLANTABLE DEVICE [name] 11/12/2015   SJM Assurity MRI DR PPM implanted by Dr Graciela Husbands for tachy/brady syndrome   TEE WITHOUT CARDIOVERSION   05/17/2012   Procedure: TRANSESOPHAGEAL ECHOCARDIOGRAM (TEE);  Surgeon: Dolores Patty, MD;  Location: [facility] ENDOSCOPY;  Service: Cardiovascular;  Laterality: [name];   TOTAL HIP ARTHROPLASTY     right    Social History  reports that he has quit smoking. He quit smokeless tobacco use about 45 years ago. He reports that he does not drink alcohol and does not use drugs.  No Known Allergies  Family History  Problem Relation Age of Onset   Tuberculosis Father    Other Unknown        No known heart disease  Reviewed on mission  Prior to Admission medications   Medication Sig Start Date End Date Taking? Authorizing Provider  atorvastatin (LIPITOR) 40 MG tablet TAKE 1 TABLET BY MOUTH DAILY Patient taking differently: Take 40 mg by mouth daily. 08/25/21  Yes [name], MD  furosemide (LASIX) 20 MG tablet Take 1 tablet by mouth daily for 3 days then only as needed for weight gain/swelling Patient taking differently: Take 20 mg by mouth See admin instructions. Take 1 tablet by mouth daily for 3 days then only as needed for weight gain/swelling. 09/23/21  Yes Fenton, Clint R, PA  metoprolol tartrate (LOPRESSOR) 25 MG tablet TAKE ONE TABLET BY MOUTH TWICE DAILY (INADDITION TO 50MG  TWICE A DAY) Patient taking differently: Take 25 mg by mouth 2 (two) times daily. Take in addition to 50 mg twice a day. 01/19/22  Yes Fenton, Clint R, PA  metoprolol tartrate (LOPRESSOR) 50 MG tablet TAKE ONE TABLET BY MOUTH TWICE DAILY Patient taking differently: Take 50 mg by mouth 2 (two) times daily. 08/25/21  Yes 10/23/21, MD    Physical Exam: Vitals:   05/18/22 1645 05/18/22 1700 05/18/22 1702 05/18/22 1812  BP:  (!) 139/95 (!) 139/95 (!) 142/84  Pulse: (!) 108  (!) 120 95  Resp: (!) 31 (!) 28  20  Temp:    98.6 F (37 C)  TempSrc:    Oral  SpO2: 97%   98%  Weight:      Height:        Physical Exam Constitutional:      General: He is not in acute distress.    Appearance: Normal appearance.   HENT:     Head: Normocephalic and atraumatic.     Mouth/Throat:     Mouth: Mucous membranes are moist.     Pharynx: Oropharynx is clear.  Eyes:     Extraocular Movements: Extraocular movements intact.     Pupils: Pupils are equal, round, and reactive to light.  Cardiovascular:     Rate and Rhythm: Normal rate. Rhythm irregular.     Pulses: Normal pulses.     Heart sounds: Normal heart sounds.  Pulmonary:     Effort: Pulmonary effort is normal. No respiratory distress.     Comments: Transmitted upper airway sounds Abdominal:     General: Bowel sounds are normal. There is no distension.  Palpations: Abdomen is soft.     Tenderness: There is no abdominal tenderness.  Musculoskeletal:        General: No swelling or deformity.     Right lower leg: Edema present.     Left lower leg: Edema present.  Skin:    General: Skin is warm and dry.  Neurological:     General: No focal deficit present.     Mental Status: Mental status is at baseline.    Labs on Admission: I have personally reviewed following labs and imaging studies  CBC: Recent Labs  Lab 05/18/22 0058 05/18/22 0109  WBC 14.1*  --   NEUTROABS 11.0*  --   HGB 13.1 13.9  HCT 38.9* 41.0  MCV 98.7  --   PLT 185  --     Basic Metabolic Panel: Recent Labs  Lab 05/18/22 0058 05/18/22 0109 05/18/22 0305  NA 137 139  --   K 3.3* 3.4*  --   CL 103  --   --   CO2 25  --   --   GLUCOSE 132*  --   --   BUN 26*  --   --   CREATININE 1.98*  --   --   CALCIUM 8.1*  --   --   MG  --   --  1.7    GFR: Estimated Creatinine Clearance: 22.7 mL/min (A) (by C-G formula based on SCr of 1.98 mg/dL (H)).  Liver Function Tests: Recent Labs  Lab 05/18/22 0058  AST 83*  ALT 52*  ALKPHOS 64  BILITOT 1.5*  PROT 6.0*  ALBUMIN 2.8*    Urine analysis:    Component Value Date/Time   COLORURINE YELLOW 05/18/2022 0319   APPEARANCEUR CLOUDY (A) 05/18/2022 0319   LABSPEC 1.015 05/18/2022 0319   PHURINE 7.0 05/18/2022  0319   GLUCOSEU NEGATIVE 05/18/2022 0319   HGBUR SMALL (A) 05/18/2022 0319   BILIRUBINUR NEGATIVE 05/18/2022 0319   KETONESUR NEGATIVE 05/18/2022 0319   PROTEINUR 100 (A) 05/18/2022 0319   UROBILINOGEN 1.0 05/15/2012 0733   NITRITE NEGATIVE 05/18/2022 0319   LEUKOCYTESUR SMALL (A) 05/18/2022 0319    Radiological Exams on Admission: CT Angio Chest PE W and/or Wo Contrast  Result Date: 05/18/2022 CLINICAL DATA:  Low oxygen saturation PE suspected Acute nonlocalized abdominal pain and diarrhea EXAM: CT ANGIOGRAPHY CHEST CT ABDOMEN AND PELVIS WITH CONTRAST TECHNIQUE: Multidetector CT imaging of the chest was performed using the standard protocol during bolus administration of intravenous contrast. Multiplanar CT image reconstructions and MIPs were obtained to evaluate the vascular anatomy. Multidetector CT imaging of the abdomen and pelvis was performed using the standard protocol during bolus administration of intravenous contrast. RADIATION DOSE REDUCTION: This exam was performed according to the departmental dose-optimization program which includes automated exposure control, adjustment of the mA and/or kV according to patient size and/or use of iterative reconstruction technique. CONTRAST:  51mL OMNIPAQUE IOHEXOL 350 MG/ML SOLN COMPARISON:  CT chest 02/28/2021 FINDINGS: CTA CHEST FINDINGS Cardiovascular: Satisfactory opacification of the pulmonary arteries to the segmental level. No evidence of pulmonary embolism. Normal heart size. No pericardial effusion. Left chest wall pacemaker. Mediastinum/Nodes: Prominent mediastinal lymph nodes are favored reactive. Thyroid gland and esophagus demonstrate no significant findings. Lungs/Pleura: Diffuse moderate bronchial wall thickening. Patchy infiltrates greatest in the lower lungs. Layering secretions in the trachea. No pleural effusion or pneumothorax. Emphysema in the apices. Musculoskeletal: No chest wall abnormality. No acute osseous findings. Review of  the MIP images confirms the above findings. CT  ABDOMEN and PELVIS FINDINGS Hepatobiliary: No focal liver abnormality is seen. No gallstones, gallbladder wall thickening, or biliary dilatation. Pancreas: Fatty atrophy.  Otherwise unremarkable. Spleen: Unremarkable. Adrenals/Urinary Tract: Unremarkable adrenal glands. Multiple large simple appearing bilateral renal cysts. No follow-up is required. Large nonobstructing 1.2 cm renal calculus in the lower pole of the right kidney. Diffuse bladder wall thickening. Stomach/Bowel: Unremarkable stomach. Normal caliber large and small bowel. Colonic diverticulosis. Question mild wall thickening about a segment of the distal descending and proximal sigmoid colon. Normal appendix. Vascular/Lymphatic: Aortic atherosclerosis. No enlarged abdominal or pelvic lymph nodes. Reproductive: Unremarkable. Other: No free intraperitoneal fluid or air. Musculoskeletal: Right THA.  No acute osseous abnormality. Review of the MIP images confirms the above findings. IMPRESSION: 1. Negative for acute pulmonary embolism. 2. Infiltrates in the lower lungs compatible with pneumonia. Diffuse bronchial wall thickening with secretions in the trachea. Consider aspiration as potential etiology. 3. Possible mild colonic diverticulitis at the descending/sigmoid junction. 4. Diffuse bladder wall thickening. Correlation with urinalysis is recommended to exclude cystitis. 5.  Aortic Atherosclerosis (ICD10-I70.0). Electronically Signed   By: Minerva Festeryler  Stutzman M.D.   On: 05/18/2022 02:36   CT ABDOMEN PELVIS W CONTRAST  Result Date: 05/18/2022 CLINICAL DATA:  Low oxygen saturation PE suspected Acute nonlocalized abdominal pain and diarrhea EXAM: CT ANGIOGRAPHY CHEST CT ABDOMEN AND PELVIS WITH CONTRAST TECHNIQUE: Multidetector CT imaging of the chest was performed using the standard protocol during bolus administration of intravenous contrast. Multiplanar CT image reconstructions and MIPs were obtained to  evaluate the vascular anatomy. Multidetector CT imaging of the abdomen and pelvis was performed using the standard protocol during bolus administration of intravenous contrast. RADIATION DOSE REDUCTION: This exam was performed according to the departmental dose-optimization program which includes automated exposure control, adjustment of the mA and/or kV according to patient size and/or use of iterative reconstruction technique. CONTRAST:  80mL OMNIPAQUE IOHEXOL 350 MG/ML SOLN COMPARISON:  CT chest 02/28/2021 FINDINGS: CTA CHEST FINDINGS Cardiovascular: Satisfactory opacification of the pulmonary arteries to the segmental level. No evidence of pulmonary embolism. Normal heart size. No pericardial effusion. Left chest wall pacemaker. Mediastinum/Nodes: Prominent mediastinal lymph nodes are favored reactive. Thyroid gland and esophagus demonstrate no significant findings. Lungs/Pleura: Diffuse moderate bronchial wall thickening. Patchy infiltrates greatest in the lower lungs. Layering secretions in the trachea. No pleural effusion or pneumothorax. Emphysema in the apices. Musculoskeletal: No chest wall abnormality. No acute osseous findings. Review of the MIP images confirms the above findings. CT ABDOMEN and PELVIS FINDINGS Hepatobiliary: No focal liver abnormality is seen. No gallstones, gallbladder wall thickening, or biliary dilatation. Pancreas: Fatty atrophy.  Otherwise unremarkable. Spleen: Unremarkable. Adrenals/Urinary Tract: Unremarkable adrenal glands. Multiple large simple appearing bilateral renal cysts. No follow-up is required. Large nonobstructing 1.2 cm renal calculus in the lower pole of the right kidney. Diffuse bladder wall thickening. Stomach/Bowel: Unremarkable stomach. Normal caliber large and small bowel. Colonic diverticulosis. Question mild wall thickening about a segment of the distal descending and proximal sigmoid colon. Normal appendix. Vascular/Lymphatic: Aortic atherosclerosis. No  enlarged abdominal or pelvic lymph nodes. Reproductive: Unremarkable. Other: No free intraperitoneal fluid or air. Musculoskeletal: Right THA.  No acute osseous abnormality. Review of the MIP images confirms the above findings. IMPRESSION: 1. Negative for acute pulmonary embolism. 2. Infiltrates in the lower lungs compatible with pneumonia. Diffuse bronchial wall thickening with secretions in the trachea. Consider aspiration as potential etiology. 3. Possible mild colonic diverticulitis at the descending/sigmoid junction. 4. Diffuse bladder wall thickening. Correlation with urinalysis is recommended to exclude  cystitis. 5.  Aortic Atherosclerosis (ICD10-I70.0). Electronically Signed   By: Minerva Fester M.D.   On: 05/18/2022 02:36   CT Head Wo Contrast  Result Date: 05/18/2022 CLINICAL DATA:  Confusion EXAM: CT HEAD WITHOUT CONTRAST TECHNIQUE: Contiguous axial images were obtained from the base of the skull through the vertex without intravenous contrast. RADIATION DOSE REDUCTION: This exam was performed according to the departmental dose-optimization program which includes automated exposure control, adjustment of the mA and/or kV according to patient size and/or use of iterative reconstruction technique. COMPARISON:  10/29/2019 FINDINGS: Brain: There is atrophy and chronic small vessel disease changes. No acute intracranial abnormality. Specifically, no hemorrhage, hydrocephalus, mass lesion, acute infarction, or significant intracranial injury. Vascular: No hyperdense vessel or unexpected calcification. Skull: No acute calvarial abnormality. Sinuses/Orbits: No acute findings Other: None IMPRESSION: Atrophy, chronic microvascular disease. No acute intracranial abnormality. Electronically Signed   By: Charlett Nose M.D.   On: 05/18/2022 02:06   DG Chest Port 1 View  Result Date: 05/18/2022 CLINICAL DATA:  Possible sepsis.  Low O2 sats, AFib. EXAM: PORTABLE CHEST 1 VIEW COMPARISON:  02/28/2021. FINDINGS: The  heart size and mediastinal contours are within normal limits. There is atherosclerotic calcification of the aorta. Chronic elevation of the left diaphragm is noted with atelectasis or scarring at the lung bases. No consolidation, effusion, or pneumothorax. A dual lead pacemaker is present over the left chest. No acute osseous abnormality. IMPRESSION: Mild atelectasis or scarring at the lung bases. Electronically Signed   By: Thornell Sartorius M.D.   On: 05/18/2022 00:56    EKG: Independently reviewed.  Atrial fibrillation at 108 bpm.  PVCs noted.  Nonspecific T wave flattening.  Assessment/Plan Principal Problem:   Pneumonia of both lower lobes due to infectious organism Active Problems:   Atrial fibrillation with rapid ventricular response (HCC)   AKI (acute kidney injury) (HCC)   Decreased cardiac ejection fraction   Sick sinus syndrome (HCC)   UTI (urinary tract infection)   Hypokalemia   Acute encephalopathy   Pneumonia > Patient presenting with multiple complaints as per HPI.  These included shortness of breath and productive cough.  Found to have leukocytosis to 14 in the ED and imaging on CT consistent with lower lobe pneumonia. > Started on ceftriaxone and azithromycin in the ED.  Also on 2 L of oxygen in the ED I do not see a hypoxic episode noted so possibly for comfort. - Monitor on telemetry - Continue with ceftriaxone and azithromycin - Continue with supplemental oxygen, wean as tolerated - Follow-up blood cultures  UTI > Patient additionally presenting with several days of dysuria, urinary frequency and urinary urgency.  Urinalysis with leukocytes rare bacteria and imaging findings that could be consistent with cystitis.  Is on ceftriaxone for this, pneumonia and questionable diverticulitis. - Continue on ceftriaxone - Trend fever curve and WBC - Follow-up urine culture   ?Diverticulitis > Patient having some abdominal pain and nausea in the last several days.  There is  inflammatory changes on CT could be consistent with mild diverticulitis. > Started on ceftriaxone for multiple possible infections as above and also Flagyl added for questionable diverticulitis. - Continue ceftriaxone and Flagyl for now - Trend fever curve and WBC - Started on heart healthy diet as tolerated  AKI Hypokalemia > Creatinine elevated to 1.98 from baseline around 1.5.  Potassium noted to be mildly low at 3.3.  Magnesium normal. > Patient received 500 cc of IV fluids in the ED.  Also received  40 mill colons p.o. potassium. - Trend renal function and electrolytes - Hold off on further IV fluids given he does have edema on exam  Acute encephalopathy > Patient noted to have intermittent confusion for the past week or so in the setting of these multiple infections as above.  Also noted to have AKI. - Continue to treat above infections and AKI, monitor response  Atrial fibrillation with RVR > Patient with known history of atrial fibrillation and prior history of RVR on metoprolol and followed by cardiology. > Patient not on anticoagulation per chart review secondary to his age and fall/bleeding risk as well as a subdural hematoma in 2021. > Initially was in RVR in the 120s and ED but this has improved with home metoprolol and IV fluids.  > On arrival to the floor still having some intermittent runs of RVR into the 120s but typically in the 90s to 100s. - Continue to monitor on telemetry as above - Hold off on further IV fluids given he does have edema on exam - Discontinue heparin - Continue metoprolol 75 mg twice daily, will give evening dose early as he received only 50 this morning.  Decreased cardiac ejection fraction ?CHF > Patient follows with cardiology and has a known decreased ejection fraction though no clinical CHF.  He is prescribed Lasix on an as-needed basis.  Last echo was in 2017 with EF 60-65%, indeterminate diastolic function. - Continue metoprolol as above -  Hold off on Lasix in setting of AKI as above - Monitor I's and O's and daily weights  Sick sinus syndrome status post pacemaker - Noted  Conjunctivitis - Continue eye drops started in ED  DVT prophylaxis: Lovenox, did receive some heparin in the ED but this has been discontinued Code Status:   Full  Family Communication:  Updated by phone. Disposition Plan:   Patient is from:  Home  Anticipated DC to:  Home  Anticipated DC date:  2 - 4 days  Anticipated DC barriers: None  Consults called:  None Admission status:  Inpatient, telemetry  Severity of Illness: The appropriate patient status for this patient is INPATIENT. Inpatient status is judged to be reasonable and necessary in order to provide the required intensity of service to ensure the patient's safety. The patient's presenting symptoms, physical exam findings, and initial radiographic and laboratory data in the context of their chronic comorbidities is felt to place them at high risk for further clinical deterioration. Furthermore, it is not anticipated that the patient will be medically stable for discharge from the hospital within 2 midnights of admission.   * I certify that at the point of admission it is my clinical judgment that the patient will require inpatient hospital care spanning beyond 2 midnights from the point of admission due to high intensity of service, high risk for further deterioration and high frequency of surveillance required.Synetta Fail MD [facility]  How to contact the [facility] Attending or Consulting provider 7A - 7P or covering provider during after hours 7P -7A, for this patient?   Check the care team in [facility] and look for a) attending/consulting [facility] provider listed and b) the [facility] team listed Log into [email or web address] and use [facility] universal password to access. If you do not have the password, please contact the hospital operator. Locate the [facility] provider you are looking for under  [facility] and page to a number that you can be directly reached. If you still have difficulty reaching  the provider, please page the [facility] (Director on Call) for the Hospitalists listed on amion for assistance.  05/18/2022, 7:15 PM

## 2022-05-18 NOTE — ED Notes (Signed)
HR 92-126/min, remains in Afib with frequent multifocal PVCs

## 2022-05-18 NOTE — Progress Notes (Addendum)
ANTICOAGULATION CONSULT NOTE - Initial Consult  Pharmacy Consult:  Heparin Indication: atrial fibrillation  No Known Allergies  Patient Measurements: Height: 5\' 9"  (175.3 cm) Weight: 86.2 kg (190 lb) IBW/kg (Calculated) : 70.7 Heparin Dosing Weight: 86 kg  Vital Signs: Temp: 98.4 F (36.9 C) (09/26 0012) Temp Source: Oral (09/26 0012) BP: 108/69 (09/26 0115) Pulse Rate: 38 (09/26 0115)  Labs: Recent Labs    05/18/22 0058 05/18/22 0109  HGB 13.1 13.9  HCT 38.9* 41.0  PLT 185  --   APTT 35  --   LABPROT 15.2  --   INR 1.2  --   CREATININE 1.98*  --   TROPONINIHS 41*  --     Estimated Creatinine Clearance: 22.7 mL/min (A) (by C-G formula based on SCr of 1.98 mg/dL (H)).   Medical History: Past Medical History:  Diagnosis Date   Atrial fibrillation (HCC)    Back pain    COPD (chronic obstructive pulmonary disease) (HCC)    self diagnosed   Osteoarthritis    s/p right total hip   Shoulder pain, bilateral    Stroke (HCC)    Tachy-brady syndrome (HCC)    a. s/p STJ dual chamber PPM    Thrombocytopenia ([place])       Assessment: 89 YOM presented with cough, diarrhea, confusion, decreased UOP and poor PO intake.  Patient is started on antibiotics for PNA/sepsis.  Pharmacy consulted to dose IV heparin for Afib (CHADSVASC 4).  Of note, patient was taken off of Eliquis due to SDH in 2021.  Goal of Therapy:  Heparin level 0.3-0.7 units/ml Monitor platelets by anticoagulation protocol: Yes   Plan:  Heparin 4000 units IV bolus, then Heparin gtt at 1100 units/hr Check 8 hr heparin level Daily heparin level and CBC F/u long-term AC plans  Calvary Difranco D. Mina Marble, PharmD, BCPS, [place] 05/18/2022, 3:16 AM

## 2022-05-18 NOTE — ED Triage Notes (Signed)
With decreased UOP, poor appetite, low O2 sats, A-Fib, more confusion for 1 -2 weeks.  Pt has had some diarrhea as well.

## 2022-05-18 NOTE — ED Notes (Signed)
Noted to cont to have tachypnea with very congested cough, rhonchi noted bilaterally, with occ exp wheezing noted as well.

## 2022-05-18 NOTE — ED Provider Notes (Addendum)
[facility] EMERGENCY DEPARTMENT Provider Note   CSN: [id number] Arrival date & time: 05/18/22  0002     History  Chief Complaint  Patient presents with   Cough    Justin Baker is a 88 y.o. male.  Patient as above with significant medical history as below, including COPD, A-fib, no longer on DOAC, prior CVA, tachybradycardia syndrome status post dual-chamber PPM who presents to the ED with complaint of cough, fatigue, diarrhea, poor p.o. intake, AMS, chills.  Patient is hard of hearing, discussed with family at bedside.  Patient with ongoing productive cough over the past 1.5 to 2 weeks, gradually worsening, intermittently green/brown productive sputum.  Intermittent chills.  No fevers.  Poor appetite, ongoing intermittent diarrhea.  Generalized abd pain. No BRBPR or melena.  No recent antibiotics or recent travel.   Have increased urgency, frequency, mild dysuria over the last few days.  Mild dyspnea with exertion, no chest pain.  Occasional palpitations over the last few days.  Family ports that over the past week he has been confused intermittently, yesterday he was sitting in the living with his shirt off and when family asked him why he was not wearing a shirt he was not sure.  He has been leaving the lights on in the house, going to bed at all times the day which is very abnormal for the patient per family at bedside.  No recent falls. No recent head injuries or  recent medication / diet changes. No etoh  Dr Johnsie Cancel cardiology Dr Rayann Heman EP    Past Medical History:  Diagnosis Date   Atrial fibrillation ([place])    Back pain    COPD (chronic obstructive pulmonary disease) (HCC)    self diagnosed   Osteoarthritis    s/p right total hip   Shoulder pain, bilateral    Stroke ([place])    Tachy-brady syndrome ([place])    a. s/p STJ dual chamber PPM    Thrombocytopenia ([place])     Past Surgical History:  Procedure Laterality Date   CARDIOVERSION  05/17/2012   Procedure:  CARDIOVERSION;  Surgeon: Jolaine Artist, MD;  Location: [facility];  Service: Cardiovascular;  Laterality: [name];   CATARACT EXTRACTION W/ INTRAOCULAR LENS IMPLANT     right   EP IMPLANTABLE DEVICE [name] 11/12/2015   SJM Assurity MRI DR PPM implanted by Dr Caryl Comes for tachy/brady syndrome   TEE WITHOUT CARDIOVERSION  05/17/2012   Procedure: TRANSESOPHAGEAL ECHOCARDIOGRAM (TEE);  Surgeon: Jolaine Artist, MD;  Location[facility] ENDOSCOPY;  Service: Cardiovascular;  Laterality: [name];   TOTAL HIP ARTHROPLASTY     right     The history is provided by the patient and a relative.  Cough Associated symptoms: chills, eye discharge and shortness of breath        Home Medications Prior to Admission medications   Medication Sig Start Date End Date Taking? Authorizing Provider  atorvastatin (LIPITOR) 40 MG tablet TAKE 1 TABLET BY MOUTH DAILY 08/25/21   Josue Hector, MD  furosemide (LASIX) 20 MG tablet Take 1 tablet by mouth daily for 3 days then only as needed for weight gain/swelling 09/23/21   Fenton, Clint R, PA  metoprolol tartrate (LOPRESSOR) 25 MG tablet TAKE ONE TABLET BY MOUTH TWICE DAILY (INADDITION TO 50MG  TWICE A DAY) 01/19/22   Fenton, Clint R, PA  metoprolol tartrate (LOPRESSOR) 50 MG tablet TAKE ONE TABLET BY MOUTH TWICE DAILY 08/25/21   Josue Hector, MD  Misc Natural Products (OSTEO BI-FLEX ADV JOINT  SHIELD) TABS Take by mouth.    [provider]      Allergies    Patient has no known allergies.    Review of Systems   Review of Systems  Constitutional:  Positive for chills and fatigue.  HENT:  Positive for congestion.   Eyes:  Positive for discharge.  Respiratory:  Positive for cough and shortness of breath.   Gastrointestinal:  Positive for abdominal pain, diarrhea and nausea. Negative for vomiting.  Genitourinary:  Positive for difficulty urinating, dysuria, frequency and urgency.  Psychiatric/Behavioral:  Positive for confusion.   All other systems reviewed and are  negative.   Physical Exam Updated Vital Signs BP 133/61   Pulse (!) 113   Temp 98.3 F (36.8 C) (Oral)   Resp (!) 30   Ht 5\' 9"  (1.753 m)   Wt 86.2 kg   SpO2 96%   BMI 28.06 kg/m  Physical Exam Vitals and nursing note reviewed.  Constitutional:      General: He is not in acute distress.    Appearance: Normal appearance. He is well-developed. He is not ill-appearing.  HENT:     Head: Normocephalic and atraumatic. No raccoon eyes, Battle's sign, right periorbital erythema or left periorbital erythema.     Jaw: There is normal jaw occlusion.     Right Ear: External ear normal.     Left Ear: External ear normal.     Mouth/Throat:     Mouth: Mucous membranes are moist.  Eyes:     General: No scleral icterus.    Extraocular Movements: Extraocular movements intact.     Pupils: Pupils are equal, round, and reactive to light.  Cardiovascular:     Rate and Rhythm: Tachycardia present. Rhythm irregular.     Pulses: Normal pulses.     Heart sounds: Normal heart sounds.  Pulmonary:     Effort: Pulmonary effort is normal. No respiratory distress.     Breath sounds: Normal breath sounds.  Abdominal:     General: Abdomen is flat.     Palpations: Abdomen is soft.     Tenderness: There is no abdominal tenderness. There is no guarding or rebound.  Musculoskeletal:        General: Normal range of motion.     Cervical back: Normal range of motion.     Right lower leg: 2+ Edema present.     Left lower leg: 2+ Edema present.  Skin:    General: Skin is warm and dry.     Capillary Refill: Capillary refill takes less than 2 seconds.  Neurological:     Mental Status: He is alert and oriented to person, place, and time.     GCS: GCS eye subscore is 4. GCS verbal subscore is 5. GCS motor subscore is 6.     Cranial Nerves: Cranial nerves 2-12 are intact. No dysarthria.     Sensory: Sensation is intact. No sensory deficit.     Motor: Motor function is intact. No weakness or tremor.      Coordination: Coordination is intact.  Psychiatric:        Mood and Affect: Mood normal.        Behavior: Behavior normal.     ED Results / Procedures / Treatments   Labs (all labs ordered are listed, but only abnormal results are displayed) Labs Reviewed  COMPREHENSIVE METABOLIC PANEL - Abnormal; Notable for the following components:      Result Value   Potassium 3.3 (*)  Glucose, Bld 132 (*)    BUN 26 (*)    Creatinine, Ser 1.98 (*)    Calcium 8.1 (*)    Total Protein 6.0 (*)    Albumin 2.8 (*)    AST 83 (*)    ALT 52 (*)    Total Bilirubin 1.5 (*)    GFR, Estimated 30 (*)    All other components within normal limits  CBC WITH DIFFERENTIAL/PLATELET - Abnormal; Notable for the following components:   WBC 14.1 (*)    RBC 3.94 (*)    HCT 38.9 (*)    Neutro Abs 11.0 (*)    Abs Immature Granulocytes 0.10 (*)    All other components within normal limits  URINALYSIS, ROUTINE W REFLEX MICROSCOPIC - Abnormal; Notable for the following components:   APPearance CLOUDY (*)    Hgb urine dipstick SMALL (*)    Protein, ur 100 (*)    Leukocytes,Ua SMALL (*)    All other components within normal limits  BRAIN NATRIURETIC PEPTIDE - Abnormal; Notable for the following components:   B Natriuretic Peptide 479.2 (*)    All other components within normal limits  URINALYSIS, MICROSCOPIC (REFLEX) - Abnormal; Notable for the following components:   Bacteria, UA RARE (*)    All other components within normal limits  I-STAT VENOUS BLOOD GAS, ED - Abnormal; Notable for the following components:   pO2, Ven 23 (*)    Potassium 3.4 (*)    Calcium, Ion 1.14 (*)    All other components within normal limits  TROPONIN I (HIGH SENSITIVITY) - Abnormal; Notable for the following components:   Troponin I (High Sensitivity) 41 (*)    All other components within normal limits  TROPONIN I (HIGH SENSITIVITY) - Abnormal; Notable for the following components:   Troponin I (High Sensitivity) 37 (*)    All  other components within normal limits  RESP PANEL BY RT-PCR (FLU A&B, COVID) ARPGX2  CULTURE, BLOOD (ROUTINE X 2)  CULTURE, BLOOD (ROUTINE X 2)  EXPECTORATED SPUTUM ASSESSMENT W GRAM STAIN, RFLX TO RESP C  LACTIC ACID, PLASMA  PROTIME-INR  APTT  LIPASE, BLOOD  AMMONIA  RAPID URINE DRUG SCREEN, HOSP PERFORMED  MAGNESIUM  LACTIC ACID, PLASMA  TSH  HEPARIN LEVEL (UNFRACTIONATED)    EKG EKG Interpretation  Date/Time:  Tuesday May 18 2022 00:11:01 EDT Ventricular Rate:  108 PR Interval:  172 QRS Duration: 88 QT Interval:  323 QTC Calculation: 433 R Axis:   -21 Text Interpretation: afib with pvc's Sinus pause with ventricular escape Borderline left axis deviation Borderline repolarization abnormality Confirmed by Tanda Rockers ([id number]) on 05/18/2022 3:04:48 AM  Radiology CT Angio Chest PE W and/or Wo Contrast  Result Date: 05/18/2022 CLINICAL DATA:  Low oxygen saturation PE suspected Acute nonlocalized abdominal pain and diarrhea EXAM: CT ANGIOGRAPHY CHEST CT ABDOMEN AND PELVIS WITH CONTRAST TECHNIQUE: Multidetector CT imaging of the chest was performed using the standard protocol during bolus administration of intravenous contrast. Multiplanar CT image reconstructions and MIPs were obtained to evaluate the vascular anatomy. Multidetector CT imaging of the abdomen and pelvis was performed using the standard protocol during bolus administration of intravenous contrast. RADIATION DOSE REDUCTION: This exam was performed according to the departmental dose-optimization program which includes automated exposure control, adjustment of the mA and/or kV according to patient size and/or use of iterative reconstruction technique. CONTRAST:  80mL OMNIPAQUE IOHEXOL 350 MG/ML SOLN COMPARISON:  CT chest 02/28/2021 FINDINGS: CTA CHEST FINDINGS Cardiovascular: Satisfactory opacification of the pulmonary arteries  to the segmental level. No evidence of pulmonary embolism. Normal heart size. No pericardial  effusion. Left chest wall pacemaker. Mediastinum/Nodes: Prominent mediastinal lymph nodes are favored reactive. Thyroid gland and esophagus demonstrate no significant findings. Lungs/Pleura: Diffuse moderate bronchial wall thickening. Patchy infiltrates greatest in the lower lungs. Layering secretions in the trachea. No pleural effusion or pneumothorax. Emphysema in the apices. Musculoskeletal: No chest wall abnormality. No acute osseous findings. Review of the MIP images confirms the above findings. CT ABDOMEN and PELVIS FINDINGS Hepatobiliary: No focal liver abnormality is seen. No gallstones, gallbladder wall thickening, or biliary dilatation. Pancreas: Fatty atrophy.  Otherwise unremarkable. Spleen: Unremarkable. Adrenals/Urinary Tract: Unremarkable adrenal glands. Multiple large simple appearing bilateral renal cysts. No follow-up is required. Large nonobstructing 1.2 cm renal calculus in the lower pole of the right kidney. Diffuse bladder wall thickening. Stomach/Bowel: Unremarkable stomach. Normal caliber large and small bowel. Colonic diverticulosis. Question mild wall thickening about a segment of the distal descending and proximal sigmoid colon. Normal appendix. Vascular/Lymphatic: Aortic atherosclerosis. No enlarged abdominal or pelvic lymph nodes. Reproductive: Unremarkable. Other: No free intraperitoneal fluid or air. Musculoskeletal: Right THA.  No acute osseous abnormality. Review of the MIP images confirms the above findings. IMPRESSION: 1. Negative for acute pulmonary embolism. 2. Infiltrates in the lower lungs compatible with pneumonia. Diffuse bronchial wall thickening with secretions in the trachea. Consider aspiration as potential etiology. 3. Possible mild colonic diverticulitis at the descending/sigmoid junction. 4. Diffuse bladder wall thickening. Correlation with urinalysis is recommended to exclude cystitis. 5.  Aortic Atherosclerosis (ICD10-I70.0). Electronically Signed   By: Minerva Fester M.D.   On: 05/18/2022 02:36   CT ABDOMEN PELVIS W CONTRAST  Result Date: 05/18/2022 CLINICAL DATA:  Low oxygen saturation PE suspected Acute nonlocalized abdominal pain and diarrhea EXAM: CT ANGIOGRAPHY CHEST CT ABDOMEN AND PELVIS WITH CONTRAST TECHNIQUE: Multidetector CT imaging of the chest was performed using the standard protocol during bolus administration of intravenous contrast. Multiplanar CT image reconstructions and MIPs were obtained to evaluate the vascular anatomy. Multidetector CT imaging of the abdomen and pelvis was performed using the standard protocol during bolus administration of intravenous contrast. RADIATION DOSE REDUCTION: This exam was performed according to the departmental dose-optimization program which includes automated exposure control, adjustment of the mA and/or kV according to patient size and/or use of iterative reconstruction technique. CONTRAST:  16mL OMNIPAQUE IOHEXOL 350 MG/ML SOLN COMPARISON:  CT chest 02/28/2021 FINDINGS: CTA CHEST FINDINGS Cardiovascular: Satisfactory opacification of the pulmonary arteries to the segmental level. No evidence of pulmonary embolism. Normal heart size. No pericardial effusion. Left chest wall pacemaker. Mediastinum/Nodes: Prominent mediastinal lymph nodes are favored reactive. Thyroid gland and esophagus demonstrate no significant findings. Lungs/Pleura: Diffuse moderate bronchial wall thickening. Patchy infiltrates greatest in the lower lungs. Layering secretions in the trachea. No pleural effusion or pneumothorax. Emphysema in the apices. Musculoskeletal: No chest wall abnormality. No acute osseous findings. Review of the MIP images confirms the above findings. CT ABDOMEN and PELVIS FINDINGS Hepatobiliary: No focal liver abnormality is seen. No gallstones, gallbladder wall thickening, or biliary dilatation. Pancreas: Fatty atrophy.  Otherwise unremarkable. Spleen: Unremarkable. Adrenals/Urinary Tract: Unremarkable adrenal  glands. Multiple large simple appearing bilateral renal cysts. No follow-up is required. Large nonobstructing 1.2 cm renal calculus in the lower pole of the right kidney. Diffuse bladder wall thickening. Stomach/Bowel: Unremarkable stomach. Normal caliber large and small bowel. Colonic diverticulosis. Question mild wall thickening about a segment of the distal descending and proximal sigmoid colon. Normal appendix. Vascular/Lymphatic: Aortic atherosclerosis. No enlarged  abdominal or pelvic lymph nodes. Reproductive: Unremarkable. Other: No free intraperitoneal fluid or air. Musculoskeletal: Right THA.  No acute osseous abnormality. Review of the MIP images confirms the above findings. IMPRESSION: 1. Negative for acute pulmonary embolism. 2. Infiltrates in the lower lungs compatible with pneumonia. Diffuse bronchial wall thickening with secretions in the trachea. Consider aspiration as potential etiology. 3. Possible mild colonic diverticulitis at the descending/sigmoid junction. 4. Diffuse bladder wall thickening. Correlation with urinalysis is recommended to exclude cystitis. 5.  Aortic Atherosclerosis (ICD10-I70.0). Electronically Signed   By: Minerva Fester M.D.   On: 05/18/2022 02:36   CT Head Wo Contrast  Result Date: 05/18/2022 CLINICAL DATA:  Confusion EXAM: CT HEAD WITHOUT CONTRAST TECHNIQUE: Contiguous axial images were obtained from the base of the skull through the vertex without intravenous contrast. RADIATION DOSE REDUCTION: This exam was performed according to the departmental dose-optimization program which includes automated exposure control, adjustment of the mA and/or kV according to patient size and/or use of iterative reconstruction technique. COMPARISON:  10/29/2019 FINDINGS: Brain: There is atrophy and chronic small vessel disease changes. No acute intracranial abnormality. Specifically, no hemorrhage, hydrocephalus, mass lesion, acute infarction, or significant intracranial injury.  Vascular: No hyperdense vessel or unexpected calcification. Skull: No acute calvarial abnormality. Sinuses/Orbits: No acute findings Other: None IMPRESSION: Atrophy, chronic microvascular disease. No acute intracranial abnormality. Electronically Signed   By: Charlett Nose M.D.   On: 05/18/2022 02:06   DG Chest Port 1 View  Result Date: 05/18/2022 CLINICAL DATA:  Possible sepsis.  Low O2 sats, AFib. EXAM: PORTABLE CHEST 1 VIEW COMPARISON:  02/28/2021. FINDINGS: The heart size and mediastinal contours are within normal limits. There is atherosclerotic calcification of the aorta. Chronic elevation of the left diaphragm is noted with atelectasis or scarring at the lung bases. No consolidation, effusion, or pneumothorax. A dual lead pacemaker is present over the left chest. No acute osseous abnormality. IMPRESSION: Mild atelectasis or scarring at the lung bases. Electronically Signed   By: Thornell Sartorius M.D.   On: 05/18/2022 00:56    Procedures .Critical Care  Performed by: Sloan Leiter, DO Authorized by: Sloan Leiter, DO   Critical care provider statement:    Critical care time (minutes):  77   Critical care time was exclusive of:  Separately billable procedures and treating other patients   Critical care was necessary to treat or prevent imminent or life-threatening deterioration of the following conditions:  Cardiac failure, sepsis and renal failure   Critical care was time spent personally by me on the following activities:  Development of treatment plan with patient or surrogate, discussions with consultants, evaluation of patient's response to treatment, examination of patient, ordering and review of laboratory studies, ordering and review of radiographic studies, ordering and performing treatments and interventions, pulse oximetry, re-evaluation of patient's condition, review of old charts and obtaining history from patient or surrogate   Care discussed with: admitting provider        Medications Ordered in ED Medications  lactated ringers infusion ( Intravenous New Bag/Given 05/18/22 0344)  cefTRIAXone (ROCEPHIN) 2 g in sodium chloride 0.9 % 100 mL IVPB (0 g Intravenous Stopped 05/18/22 0513)  azithromycin (ZITHROMAX) 500 mg in sodium chloride 0.9 % 250 mL IVPB (0 mg Intravenous Stopped 05/18/22 0542)  heparin ADULT infusion 100 units/mL (25000 units/241mL) (1,100 Units/hr Intravenous New Bag/Given 05/18/22 0436)  metoprolol tartrate (LOPRESSOR) tablet 25 mg (has no administration in time range)  sodium chloride 0.9 % bolus 500 mL (0 mLs  Intravenous Stopped 05/18/22 0156)  guaiFENesin (ROBITUSSIN) 100 MG/5ML liquid 5 mL (5 mLs Oral Given 05/18/22 0116)  iohexol (OMNIPAQUE) 350 MG/ML injection 80 mL (80 mLs Intravenous Contrast Given 05/18/22 0206)  lactated ringers bolus 500 mL (0 mLs Intravenous Stopped 05/18/22 0516)  heparin bolus via infusion 4,000 Units (4,000 Units Intravenous Bolus from Bag 05/18/22 0437)  metroNIDAZOLE (FLAGYL) IVPB 500 mg (500 mg Intravenous New Bag/Given 05/18/22 0545)  potassium chloride SA (KLOR-CON M) CR tablet 40 mEq (40 mEq Oral Given 05/18/22 0512)  metoprolol tartrate (LOPRESSOR) tablet 25 mg (25 mg Oral Given 05/18/22 1610)    ED Course/ Medical Decision Making/ A&P Clinical Course as of 05/18/22 0736  Tue May 18, 2022  0106 HR intermittently 120-130, afib [SG]  0138 Creatinine(!): 1.98 Baseline around 1.5, pt with poor PO intake last week or so, appears dry on exam. Give bolus IVF [SG]  0302 Patient with likely commune acquired pneumonia, possible aspiration pneumonia on imaging.  Patient with copious secretions.  We will continue Rocephin azithromycin.  Patient also with A-fib with RVR, not currently anticoagulation but his CHA2DS2-VASc is 2, recommend starting heparin.  We will hold off on rate control medications at this time given tachycardia potentially secondary to sepsis.  We will continue IV fluids [SG]    Clinical Course User  Index [SG] Sloan Leiter, DO                           Medical Decision Making Amount and/or Complexity of Data Reviewed Labs: ordered. Decision-making details documented in ED Course. Radiology: ordered. ECG/medicine tests: ordered.  Risk OTC drugs. Prescription drug management. Decision regarding hospitalization.   This patient presents to the ED with chief complaint(s) of cough, congestion, AMS, dib, dysuria, with pertinent past medical history of persistent A-fib, sick sinus syndrome, pacemaker which further complicates the presenting complaint. The complaint involves an extensive differential diagnosis and also carries with it a high risk of complications and morbidity.    In my evaluation of this patient's dyspnea my DDx includes, but is not limited to, pneumonia, pulmonary embolism, pneumothorax, pulmonary edema, metabolic acidosis, asthma, COPD, cardiac cause, anemia, anxiety, etc.   Differential diagnoses includes but is not exclusive to alcohol, illicit or prescription medications, intracranial pathology such as stroke, intracerebral hemorrhage, fever or infectious causes including sepsis, hypoxemia, uremia, trauma, endocrine related disorders such as diabetes, hypoglycemia, thyroid-related diseases, infectious, metabolic syndrome, ACS etc.  . Serious etiologies were considered.   The initial plan is to screening labs and imaging   Additional history obtained: Additional history obtained from family Records reviewed Care Everywhere/External Records and Primary Care Documents home medications  Independent labs interpretation:  The following labs were independently interpreted:  CBC with WBC 14.1, he is tachycardic intermittently A-fib with RVR, chills at home, no fever.  tachypnea.  C/w sepsis Troponin elevated, delta downtrending 41 > 37 BNP 479 Creatinine elevated from baseline today is 1.98, baseline around 1.5.  Continue IV fluids  Independent visualization of  imaging: - I independently visualized the following imaging with scope of interpretation limited to determining acute life threatening conditions related to emergency care: CTH, CTPE, CT AP chest x-ray, which revealed pneumonia, possible aspiration, possible uncomplicated diverticulitis, ?  Cystitis. >> ua does not appear c/w UTI; uti would be covered regardless with abx already in process for other ailments today  >> He is having diarrhea.  Will cover for both pneumonia and diverticulitis. Na is not  depleted; reduced suspicion for legionella  Cardiac monitoring was reviewed and interpreted by myself which shows afib with rvr  Treatment and Reassessment: Rocephin, Flagyl, azithromycin Fluids Heparin Antitussive >> Improving   Consultation: - Consulted or discussed management/test interpretation w/ external professional: na  Consideration for admission or further workup: Admission was considered   88 year old male to the ED with various complaints.  Found to have pneumonia, possible aspiration, diverticulitis, A-fib with RVR.  Broad-spectrum antibiotics started.  Code sepsis alerted.  Blood pressure stable.  Blood cultures obtained prior to starting antibiotics.  Rocephin, azithromycin, Flagyl.  Patient did not take his nighttime beta-blocker, will provide in the ED.  Replace potassium.  Start heparin as his CHA2DS2-VASc is 4.  Plan admission for the above.  Patient family agreeable.  Discussed with Dr. Leafy HalfShalhoub who accepts patient for admission.  Social Determinants of health: Social History   Tobacco Use   Smoking status: Former   Smokeless tobacco: Former    Quit date: 08/23/1976   Tobacco comments:    Former smoker 09/24/21  Vaping Use   Vaping Use: Never used  Substance Use Topics   Alcohol use: No   Drug use: No            Final Clinical Impression(s) / ED Diagnoses Final diagnoses:  Sepsis, due to unspecified organism, unspecified whether acute organ dysfunction  present (HCC)  Community acquired pneumonia, unspecified laterality  Atrial fibrillation with RVR (HCC)  Diverticulitis    Rx / DC Orders ED Discharge Orders     None         Sloan LeiterGray, Nicholson Starace A, DO 05/18/22 0736    Sloan LeiterGray, Elijahjames Fuelling A, DO 05/18/22 [phone]

## 2022-05-18 NOTE — Progress Notes (Signed)
    OVERNIGHT PROGRESS REPORT  Notified by RN for family and patient request to make patient DNR status.  Verified DNR with family and patient in room in person. Patient and family are aware of the specifics of this decision and have existing paperwork for this decision and will provide it in the AM. (05/19/2022)  Gershon Cull MSNA MSN [place]

## 2022-05-18 NOTE — ED Notes (Signed)
Secure handoff report provided to receiving RN

## 2022-05-18 NOTE — ED Provider Notes (Signed)
  Physical Exam  BP 115/80   Pulse (!) 59   Temp 98.4 F (36.9 C)   Resp 20   Ht 5\' 9"  (1.753 m)   Wt 86.2 kg   SpO2 94%   BMI 28.06 kg/m   Physical Exam  Procedures  Procedures  ED Course / MDM   Clinical Course as of 05/18/22 1412  Tue May 18, 2022  0106 HR intermittently 120-130, afib [SG]  0138 Creatinine(!): 1.98 Baseline around 1.5, pt with poor PO intake last week or so, appears dry on exam. Give bolus IVF [SG]  0302 Patient with likely commune acquired pneumonia, possible aspiration pneumonia on imaging.  Patient with copious secretions.  We will continue Rocephin azithromycin.  Patient also with A-fib with RVR, not currently anticoagulation but his CHA2DS2-VASc is 2, recommend starting heparin.  We will hold off on rate control medications at this time given tachycardia potentially secondary to sepsis.  We will continue IV fluids [SG]    Clinical Course User Index [SG] Jeanell Sparrow, DO   Medical Decision Making Amount and/or Complexity of Data Reviewed Labs: ordered. Decision-making details documented in ED Course. Radiology: ordered. ECG/medicine tests: ordered.  Risk OTC drugs. Prescription drug management. Decision regarding hospitalization.   Called to see patient.  Has been crusting around the eyes for the last few days.  Has been using warm soaks.  Mild injection of the conjunctiva.  Slight drainage.  We will treat with antibiotics for potential conjunctivitis.       Davonna Belling, MD 05/18/22 [phone]

## 2022-05-18 NOTE — Progress Notes (Signed)
ANTICOAGULATION CONSULT NOTE - Follow Up  Pharmacy Consult:  Heparin Indication: atrial fibrillation  No Known Allergies  Patient Measurements: Height: 5\' 9"  (175.3 cm) Weight: 86.2 kg (190 lb) IBW/kg (Calculated) : 70.7 Heparin Dosing Weight: 86 kg  Vital Signs: Temp: 98.4 F (36.9 C) (09/26 1117) Temp Source: Oral (09/26 0730) BP: 127/93 (09/26 1000) Pulse Rate: 69 (09/26 1030)  Labs: Recent Labs    05/18/22 0058 05/18/22 0109 05/18/22 0305 05/18/22 1153  HGB 13.1 13.9  --   --   HCT 38.9* 41.0  --   --   PLT 185  --   --   --   APTT 35  --   --   --   LABPROT 15.2  --   --   --   INR 1.2  --   --   --   HEPARINUNFRC  --   --   --  0.42  CREATININE 1.98*  --   --   --   TROPONINIHS 41*  --  37*  --      Estimated Creatinine Clearance: 22.7 mL/min (A) (by C-G formula based on SCr of 1.98 mg/dL (H)).   Medical History: Past Medical History:  Diagnosis Date   Atrial fibrillation (HCC)    Back pain    COPD (chronic obstructive pulmonary disease) (HCC)    self diagnosed   Osteoarthritis    s/p right total hip   Shoulder pain, bilateral    Stroke (HCC)    Tachy-brady syndrome (HCC)    a. s/p STJ dual chamber PPM    Thrombocytopenia ([place])       Assessment: 89 YOM presented with cough, diarrhea, confusion, decreased UOP and poor PO intake.  Patient is started on antibiotics for PNA/sepsis.  Pharmacy consulted to dose IV heparin for Afib (CHADSVASC 4).  Of note, patient was taking off of Eliquis due to SDH in 2021.  Heparin level returned therapeutic at 0.42. No documented signs/symptoms of bleeding within the chart. Hgb and Platelets stable  Goal of Therapy:  Heparin level 0.3-0.7 units/ml Monitor platelets by anticoagulation protocol: Yes   Plan:  Continue heparin gtt at 1100 units/hr Check 8 hr heparin level Daily heparin level and CBC F/u long-term AC plans  Sandford Craze, PharmD. [facility] Acute Care PGY-1 05/18/2022 12:52 PM

## 2022-05-18 NOTE — Sepsis Progress Note (Signed)
Following per sepsis protocol   

## 2022-05-19 DIAGNOSIS — I4891 Unspecified atrial fibrillation: Secondary | ICD-10-CM | POA: Diagnosis not present

## 2022-05-19 DIAGNOSIS — A419 Sepsis, unspecified organism: Secondary | ICD-10-CM | POA: Diagnosis not present

## 2022-05-19 DIAGNOSIS — G9340 Encephalopathy, unspecified: Secondary | ICD-10-CM | POA: Diagnosis not present

## 2022-05-19 DIAGNOSIS — N179 Acute kidney failure, unspecified: Secondary | ICD-10-CM | POA: Diagnosis not present

## 2022-05-19 DIAGNOSIS — K5792 Diverticulitis of intestine, part unspecified, without perforation or abscess without bleeding: Secondary | ICD-10-CM

## 2022-05-19 LAB — COMPREHENSIVE METABOLIC PANEL
ALT: 45 U/L — ABNORMAL HIGH (ref 0–44)
AST: 52 U/L — ABNORMAL HIGH (ref 15–41)
Albumin: 2.5 g/dL — ABNORMAL LOW (ref 3.5–5.0)
Alkaline Phosphatase: 58 U/L (ref 38–126)
Anion gap: 5 (ref 5–15)
BUN: 24 mg/dL — ABNORMAL HIGH (ref 8–23)
CO2: 24 mmol/L (ref 22–32)
Calcium: 8.2 mg/dL — ABNORMAL LOW (ref 8.9–10.3)
Chloride: 111 mmol/L (ref 98–111)
Creatinine, Ser: 1.52 mg/dL — ABNORMAL HIGH (ref 0.61–1.24)
GFR, Estimated: 41 mL/min — ABNORMAL LOW (ref >60)
Glucose, Bld: 151 mg/dL — ABNORMAL HIGH (ref 70–99)
Potassium: 3.6 mmol/L (ref 3.5–5.1)
Sodium: 140 mmol/L (ref 135–145)
Total Bilirubin: 0.8 mg/dL (ref 0.3–1.2)
Total Protein: 5.3 g/dL — ABNORMAL LOW (ref 6.5–8.1)

## 2022-05-19 LAB — CBC
HCT: 38.9 % — ABNORMAL LOW (ref 39.0–52.0)
Hemoglobin: 12.5 g/dL — ABNORMAL LOW (ref 13.0–17.0)
MCH: 33.8 pg (ref 26.0–34.0)
MCHC: 32.1 g/dL (ref 30.0–36.0)
MCV: 105.1 fL — ABNORMAL HIGH (ref 80.0–100.0)
Platelets: 156 10*3/uL (ref 150–400)
RBC: 3.7 MIL/uL — ABNORMAL LOW (ref 4.22–5.81)
RDW: 13.3 % (ref 11.5–15.5)
WBC: 16.7 10*3/uL — ABNORMAL HIGH (ref 4.0–10.5)
nRBC: 0 % (ref 0.0–0.2)

## 2022-05-19 LAB — PROCALCITONIN: Procalcitonin: <0.1 ng/mL

## 2022-05-19 MED ORDER — LEVALBUTEROL HCL 0.63 MG/3ML IN NEBU
0.6300 mg | INHALATION_SOLUTION | Freq: Four times a day (QID) | RESPIRATORY_TRACT | Status: AC | PRN
  Administered 2022-05-19 – 2022-05-20 (×3): 0.63 mg via RESPIRATORY_TRACT
  Filled 2022-05-19 (×3): qty 3

## 2022-05-19 NOTE — Hospital Course (Addendum)
Taken from H&P.  Justin Baker is a 88 y.o. male with medical history significant of atrial fibrillation, sick sinus syndrome status post pacemaker, decreased EF, pain presenting with altered mental status and multiple other complaints. Per family and chart review patient has productive cough for about 2 weeks with greenish to brown sputum production.  Intermittent chills but no reported fever.  Poor p.o. intake and decreased appetite.  He started becoming short of breath and complains of palpitations.  Also noted to have dysuria and increased urinary frequency for the past several days. Gradually worsening confusion and increased somnolence which is unusual for him.  ED course.  Afebrile with tachycardia and low 100s.  Hypoxic requiring up to 2 L of oxygen.  Labs pertinent for leukocytosis at 14.1, mild transaminitis, mildly elevated creatinine at 1.98 with baseline around 1.5.  Lactic acid normal.  BNP mildly elevated at 479.  Troponin 41>>37.  Influenza and COVID PCR negative. UA positive for hemoglobin, protein, small leukocytes and rare bacteria.  UDS negative. CXR with mild atelectasis versus scarring at bases. CT head was negative for any acute abnormality CTA was negative for PE but did show lower lobe infiltrates consistent with pneumonia as well as bronchial wall thickening and secretions in the trachea which could indicate aspiration. Also noted to have changes consistent with mild diverticulitis and bladder wall thickening consistent with cystitis.  9/27: Preliminary blood cultures negative.  Urine cultures were ordered but unfortunately lab did not had the sample, they were asking for new order entry new sample but patient is already on antibiotics.  UA with very few leukocytes which should be covered with current antibiotic for concern of colitis. Procalcitonin negative, most likely no pneumonia. Clinically feels improving as there was no belly pain today and coughing has been  improved. Ordered PT/OT evaluation.  Patient is very high risk for mortality based on age and underlying comorbidities.  Palliative care was also consulted.

## 2022-05-19 NOTE — Assessment & Plan Note (Signed)
Met sepsis criteria with leukocytosis, tachycardia and tachypnea.  Most likely GI source with concern of diverticulitis.  Preliminary blood cultures negative. There was also concern of UTI with very few leukocytosis on UA-unable to get urine culture as lab lost the sample, no need to send in new sample as patient is already on antibiotic. There was also concern of pneumonia but procalcitonin is negative. -Continue with ceftriaxone and Flagyl. -PT/OT evaluation -Palliative care consult

## 2022-05-19 NOTE — Assessment & Plan Note (Signed)
Most likely secondary to poor p.o. intake and dehydration.  Creatinine improving with IV hydration, now close to baseline of 1.4-1.5. -Monitor renal function -Avoid nephrotoxins

## 2022-05-19 NOTE — Progress Notes (Signed)
Progress Note   Patient: Justin Baker DOB: [birth year] DOA: 05/18/2022     1 DOS: the patient was seen and examined on 05/19/2022   Brief hospital course: Taken from H&P.  Justin Baker is a 88 y.o. male with medical history significant of atrial fibrillation, sick sinus syndrome status post pacemaker, decreased EF, pain presenting with altered mental status and multiple other complaints. Per family and chart review patient has productive cough for about 2 weeks with greenish to brown sputum production.  Intermittent chills but no reported fever.  Poor p.o. intake and decreased appetite.  He started becoming short of breath and complains of palpitations.  Also noted to have dysuria and increased urinary frequency for the past several days. Gradually worsening confusion and increased somnolence which is unusual for him.  ED course.  Afebrile with tachycardia and low 100s.  Hypoxic requiring up to 2 L of oxygen.  Labs pertinent for leukocytosis at 14.1, mild transaminitis, mildly elevated creatinine at 1.98 with baseline around 1.5.  Lactic acid normal.  BNP mildly elevated at 479.  Troponin 41>>37.  Influenza and COVID PCR negative. UA positive for hemoglobin, protein, small leukocytes and rare bacteria.  UDS negative. CXR with mild atelectasis versus scarring at bases. CT head was negative for any acute abnormality CTA was negative for PE but did show lower lobe infiltrates consistent with pneumonia as well as bronchial wall thickening and secretions in the trachea which could indicate aspiration. Also noted to have changes consistent with mild diverticulitis and bladder wall thickening consistent with cystitis.  9/27: Preliminary blood cultures negative.  Urine cultures were ordered but unfortunately lab did not had the sample, they were asking for new order entry new sample but patient is already on antibiotics.  UA with very few leukocytes which should be covered with current  antibiotic for concern of colitis. Procalcitonin negative, most likely no pneumonia. Clinically feels improving as there was no belly pain today and coughing has been improved. Ordered PT/OT evaluation.  Patient is very high risk for mortality based on age and underlying comorbidities.  Palliative care was also consulted.   Assessment and Plan: * Sepsis ([place]) Met sepsis criteria with leukocytosis, tachycardia and tachypnea.  Most likely GI source with concern of diverticulitis.  Preliminary blood cultures negative. There was also concern of UTI with very few leukocytosis on UA-unable to get urine culture as lab lost the sample, no need to send in new sample as patient is already on antibiotic. There was also concern of pneumonia but procalcitonin is negative. -Continue with ceftriaxone and Flagyl. -PT/OT evaluation -Palliative care consult  Pneumonia of both lower lobes due to infectious organism CT with concern of bilateral infiltrate and diffuse bronchial wall thickening with secretions in trachea, concerning for aspiration pneumonia.  Procalcitonin negative. -Continue with ceftriaxone and Flagyl -Swallow evaluation  UTI (urinary tract infection) Concern of cystitis with diffuse bladder wall thickening on CT abdomen.  UA positive for only few leukocytes.  Urine culture was ordered but unfortunately sample got lost. -No need to send in new sample as patient is already on antibiotics. -Continue to monitor  AKI (acute kidney injury) ([place]) Most likely secondary to poor p.o. intake and dehydration.  Creatinine improving with IV hydration, now close to baseline of 1.4-1.5. -Monitor renal function -Avoid nephrotoxins  Acute encephalopathy Improved.  Appears to be at baseline now.  Atrial fibrillation with rapid ventricular response (HCC) RVR in ED which improved with IV fluid and home metoprolol. Currently heart rate  stable. -Continue with home metoprolol -Not on any anticoagulation  at home based on age and risk of fall.  Also has an history of subdural hematoma in 2021.  Decreased cardiac ejection fraction Recent echocardiogram recorded in 2017 with normal EF and indeterminate diastolic function Currently appears euvolemic. Home Lasix is being held due to AKI and concern of sepsis. Uses Lasix as needed. -Continue to monitor  Sick sinus syndrome ([place]) S/p pacemaker in place  Hypokalemia Resolved Magnesium was normal. -Continue to monitor-replete as needed   Subjective: Patient thinks that he is improving, belly pain and cough seems improving.  Denies any new complaints.  Son at bedside.  Physical Exam: Vitals:   05/19/22 0500 05/19/22 0521 05/19/22 0727 05/19/22 1310  BP:  119/71  135/74  Pulse:  62  73  Resp:  (!) 23 (!) 30 18  Temp:  98.3 F (36.8 C)  97.8 F (36.6 C)  TempSrc:  Oral  Oral  SpO2:  99% 99% 99%  Weight: 89 kg     Height:       General.  Frail elderly man, in no acute distress. Pulmonary.  Scattered rhonchi bilaterally, normal respiratory effort. CV.  Regular rate and rhythm, no JVD, rub or murmur. Abdomen.  Soft, nontender, nondistended, BS positive. CNS.  Alert and oriented .  No focal neurologic deficit. Extremities.  1+ LE edema, no cyanosis, pulses intact and symmetrical. Psychiatry.  Appears to have some cognitive impairment  Data Reviewed: Prior data reviewed  Family Communication: Discussed with son at bedside  Disposition: Status is: Inpatient Remains inpatient appropriate because: Severity of illness   Planned Discharge Destination: Home with Home Health  DVT prophylaxis.  Lovenox Time spent: 50 minutes  This record has been created using Systems analyst. Errors have been sought and corrected,but may not always be located. Such creation errors do not reflect on the standard of care.  Author: Lorella Nimrod, MD 05/19/2022 3:02 PM  For on call review [email or web address].

## 2022-05-19 NOTE — Assessment & Plan Note (Signed)
CT with concern of bilateral infiltrate and diffuse bronchial wall thickening with secretions in trachea, concerning for aspiration pneumonia.  Procalcitonin negative. -Continue with ceftriaxone and Flagyl -Swallow evaluation

## 2022-05-19 NOTE — Assessment & Plan Note (Signed)
Recent echocardiogram recorded in 2017 with normal EF and indeterminate diastolic function Currently appears euvolemic. Home Lasix is being held due to AKI and concern of sepsis. Uses Lasix as needed. -Continue to monitor

## 2022-05-19 NOTE — Assessment & Plan Note (Signed)
RVR in ED which improved with IV fluid and home metoprolol. Currently heart rate stable. -Continue with home metoprolol -Not on any anticoagulation at home based on age and risk of fall.  Also has an history of subdural hematoma in 2021.

## 2022-05-19 NOTE — Progress Notes (Signed)
  Transition of Care [facility]) Screening Note   Patient Details  Name: Justin Baker Date of Birth: January 27, 1922   Transition of Care [facility]) CM/SW Contact:    Roseanne Kaufman, RN Phone Number: 05/19/2022, 1:21 PM    Transition of Care Department [facility]) has reviewed patient and no TOC needs have been identified at this time. We will continue to monitor patient advancement through interdisciplinary progression rounds. If new patient transition needs arise, please place a TOC consult.

## 2022-05-19 NOTE — Assessment & Plan Note (Signed)
Improved.  Appears to be at baseline now.

## 2022-05-19 NOTE — Assessment & Plan Note (Addendum)
Concern of cystitis with diffuse bladder wall thickening on CT abdomen.  UA positive for only few leukocytes.  Urine culture was ordered but unfortunately sample got lost. -No need to send in new sample as patient is already on antibiotics. -Continue to monitor

## 2022-05-19 NOTE — Assessment & Plan Note (Signed)
Resolved Magnesium was normal. -Continue to monitor-replete as needed

## 2022-05-19 NOTE — Assessment & Plan Note (Signed)
S/p pacemaker in place

## 2022-05-20 DIAGNOSIS — G9340 Encephalopathy, unspecified: Secondary | ICD-10-CM | POA: Diagnosis not present

## 2022-05-20 DIAGNOSIS — A419 Sepsis, unspecified organism: Secondary | ICD-10-CM | POA: Diagnosis not present

## 2022-05-20 DIAGNOSIS — N179 Acute kidney failure, unspecified: Secondary | ICD-10-CM | POA: Diagnosis not present

## 2022-05-20 DIAGNOSIS — N3000 Acute cystitis without hematuria: Secondary | ICD-10-CM

## 2022-05-20 DIAGNOSIS — I4891 Unspecified atrial fibrillation: Secondary | ICD-10-CM | POA: Diagnosis not present

## 2022-05-20 LAB — LACTIC ACID, PLASMA
Lactic Acid, Venous: 1.2 mmol/L (ref 0.5–1.9)
Lactic Acid, Venous: 2.2 mmol/L (ref 0.5–1.9)

## 2022-05-20 LAB — COMPREHENSIVE METABOLIC PANEL
ALT: 38 U/L (ref 0–44)
AST: 34 U/L (ref 15–41)
Albumin: 2.7 g/dL — ABNORMAL LOW (ref 3.5–5.0)
Alkaline Phosphatase: 61 U/L (ref 38–126)
Anion gap: 5 (ref 5–15)
BUN: 24 mg/dL — ABNORMAL HIGH (ref 8–23)
CO2: 28 mmol/L (ref 22–32)
Calcium: 8.5 mg/dL — ABNORMAL LOW (ref 8.9–10.3)
Chloride: 111 mmol/L (ref 98–111)
Creatinine, Ser: 1.51 mg/dL — ABNORMAL HIGH (ref 0.61–1.24)
GFR, Estimated: 41 mL/min — ABNORMAL LOW (ref >60)
Glucose, Bld: 107 mg/dL — ABNORMAL HIGH (ref 70–99)
Potassium: 4.2 mmol/L (ref 3.5–5.1)
Sodium: 144 mmol/L (ref 135–145)
Total Bilirubin: 1 mg/dL (ref 0.3–1.2)
Total Protein: 5.8 g/dL — ABNORMAL LOW (ref 6.5–8.1)

## 2022-05-20 LAB — CBC WITH DIFFERENTIAL/PLATELET
Abs Immature Granulocytes: 0.15 10*3/uL — ABNORMAL HIGH (ref 0.00–0.07)
Basophils Absolute: 0.1 10*3/uL (ref 0.0–0.1)
Basophils Relative: 1 %
Eosinophils Absolute: 0.2 10*3/uL (ref 0.0–0.5)
Eosinophils Relative: 1 %
HCT: 40 % (ref 39.0–52.0)
Hemoglobin: 12.8 g/dL — ABNORMAL LOW (ref 13.0–17.0)
Immature Granulocytes: 1 %
Lymphocytes Relative: 15 %
Lymphs Abs: 1.8 10*3/uL (ref 0.7–4.0)
MCH: 33.4 pg (ref 26.0–34.0)
MCHC: 32 g/dL (ref 30.0–36.0)
MCV: 104.4 fL — ABNORMAL HIGH (ref 80.0–100.0)
Monocytes Absolute: 0.7 10*3/uL (ref 0.1–1.0)
Monocytes Relative: 5 %
Neutro Abs: 9.5 10*3/uL — ABNORMAL HIGH (ref 1.7–7.7)
Neutrophils Relative %: 77 %
Platelets: 191 10*3/uL (ref 150–400)
RBC: 3.83 MIL/uL — ABNORMAL LOW (ref 4.22–5.81)
RDW: 13.6 % (ref 11.5–15.5)
WBC: 12.3 10*3/uL — ABNORMAL HIGH (ref 4.0–10.5)
nRBC: 0 % (ref 0.0–0.2)

## 2022-05-20 LAB — URINE CULTURE: Culture: NO GROWTH

## 2022-05-20 LAB — MAGNESIUM: Magnesium: 1.9 mg/dL (ref 1.7–2.4)

## 2022-05-20 MED ORDER — SODIUM CHLORIDE 0.45 % IV SOLN: INTRAVENOUS | Status: DC

## 2022-05-20 MED ORDER — ALBUMIN HUMAN 25 % IV SOLN
25.0000 g | Freq: Once | INTRAVENOUS | Status: AC
  Administered 2022-05-20: 25 g via INTRAVENOUS
  Filled 2022-05-20: qty 100

## 2022-05-20 NOTE — Evaluation (Signed)
Clinical/Bedside Swallow Evaluation Patient Details  Name: Justin Baker MRN: [id number] Date of Birth: 05/01/1922  Today's Date: 05/20/2022 Time: SLP Start Time (ACUTE ONLY): 1039 SLP Stop Time (ACUTE ONLY): 1125 SLP Time Calculation (min) (ACUTE ONLY): 46 min  Past Medical History:  Past Medical History:  Diagnosis Date   ARF (acute renal failure) ([place]) 05/16/2012   Atrial fibrillation (HCC)    Back pain    COPD (chronic obstructive pulmonary disease) ([place])    self diagnosed   COVID 01/21/2021   Osteoarthritis    s/p right total hip   Pain in the chest    Shoulder pain, bilateral    Stroke ([place])    Subdural hematoma ([place])    Caused by fall   Tachy-brady syndrome ([place])    a. s/p STJ dual chamber PPM    Thrombocytopenia ([place])    Viral respiratory illness 07/09/2014   Past Surgical History:  Past Surgical History:  Procedure Laterality Date   CARDIOVERSION  05/17/2012   Procedure: CARDIOVERSION;  Surgeon: Jolaine Artist, MD;  Location: [place];  Service: Cardiovascular;  Laterality: [name];   CATARACT EXTRACTION W/ INTRAOCULAR LENS IMPLANT     right   EP IMPLANTABLE DEVICE [name] 11/12/2015   SJM Assurity MRI DR PPM implanted by Dr Caryl Comes for tachy/brady syndrome   PACEMAKER IMPLANT     TEE WITHOUT CARDIOVERSION  05/17/2012   Procedure: TRANSESOPHAGEAL ECHOCARDIOGRAM (TEE);  Surgeon: Jolaine Artist, MD;  Location: [facility] ENDOSCOPY;  Service: Cardiovascular;  Laterality: [name];   TOTAL HIP ARTHROPLASTY     right   HPI:  Pt is a 88 yo male adm to [facility] with confusion - found to have pna.  PMH + for ARF (acute renal failure) , CVAs *left pareital temporal*, SAH,  Atrial fibrillation (HCC) Back pain, COPD (chronic obstructive pulmonary disease)- stopped smoking 35 years ago, OEA right hip replacement, chest pain, shoulder pain, tachybrady syndrome, thrombocytopena, viral respiratory illness 06/2014. Poor intake PTA. Pt reports he had speech deficits with prior CVA, uses a  wheelchair at home- does not ambulate.     CT of chest showed Infiltrates in the lower lungs compatible with pneumonia. Diffuse bronchial wall thickening with secretions in the trachea. Consider aspiration as potential etiology. 05/19/2022, also concern for Possible mild colonic diverticulitis at the descending/sigmoid junction.4. Diffuse bladder wall thickening. Correlation with urinalysis is recommended to exclude cystitis.    Assessment / Plan / Recommendation  Clinical Impression  Patient presents with clinical indications of mild dysphagia - oral and possible pharyngeal component.  Labial spillage of water noted with initial bolus swallow - not recurred.  While pt was brushing his teeth, he demonstrated weak, nonproductive cough.  He did pass 3 ounce [place] on 2nd attempt but concern is present for episodic aspiration with eps. bad episode of heart burn after eating spicy food and took Tums, he later woke coughing approx one week ago.  Question if he could've aspirated reflux. Also son recalls pt "choking' the other day while eating - questioning inhalation related aspiration.    SlP questions if pt may benefit addressing reflux medically.  Given pt's h/o CVAs, SDH, w/c bound, COVID 19 x2, subtle indications of dysphagia and pneumonia, recommend pursue MBS allow viewing of anatomy and swallow function.  Son admits pt eats too rapidly taking large boluses. Using teach back, educated pt and son to recommendations and plan - both agreeable. SLP Visit Diagnosis: Dysphagia, unspecified (R13.10);Dysphagia, oropharyngeal phase (R13.12)    Aspiration Risk  Mild  aspiration risk    Diet Recommendation Alternative means - long-term;Thin liquid   Liquid Administration via: Cup Medication Administration: Whole meds with liquid Supervision: Patient able to self feed Compensations: Slow rate;Small sips/bites Postural Changes: Seated upright at 90 degrees;Remain upright for at least 30 minutes after po intake     Other  Recommendations Oral Care Recommendations: Oral care BID    Recommendations for follow up therapy are one component of a multi-disciplinary discharge planning process, led by the attending physician.  Recommendations may be updated based on patient status, additional functional criteria and insurance authorization.  Follow up Recommendations Follow physician's recommendations for discharge plan and follow up therapies      Assistance Recommended at Discharge Intermittent Supervision/Assistance  Functional Status Assessment Patient has had a recent decline in their functional status and demonstrates the ability to make significant improvements in function in a reasonable and predictable amount of time.  Frequency and Duration min 1 x/week  1 week       Prognosis Prognosis for Safe Diet Advancement: Good      Swallow Study   General Date of Onset: 05/20/22 HPI: Pt is a 88 yo male [facility] - found to have pna.  PMH + for ARF (acute renal failure) , CVAs *left pareital temporal*, SAH,  Atrial fibrillation (HCC) Back pain, COPD (chronic obstructive pulmonary disease)- stopped smoking 35 years ago, OEA right hip replacement, chest pain, shoulder pain, tachybrady syndrome, thrombocytopena, viral respiratory illness 06/2014. Poor intake PTA. Pt reports he had speech deficits with prior CVA, uses a wheelchair at home- does not ambulate.     CT of chest showed Infiltrates in the lower lungs compatible with pneumonia. Diffuse bronchial wall thickening with secretions in the trachea. Consider aspiration as potential etiology. 05/19/2022, also concern for Possible mild colonic diverticulitis at the descending/sigmoid junction.4. Diffuse bladder wall thickening. Correlation with urinalysis is recommended to exclude cystitis. Type of Study: Bedside Swallow Evaluation Diet Prior to this Study: Thin liquids;Regular Temperature Spikes Noted: No Respiratory Status: Nasal cannula (2  liters) History of Recent Intubation: No Behavior/Cognition: Alert;Cooperative Oral Cavity Assessment: Within Functional Limits Oral Care Completed by SLP: Yes Oral Cavity - Dentition: Adequate natural dentition Vision: Functional for self-feeding Self-Feeding Abilities: Able to feed self Patient Positioning: Upright in bed Baseline Vocal Quality: Hoarse Volitional Cough: Congested (at times congested) Volitional Swallow: Able to elicit    Oral/Motor/Sensory Function Overall Oral Motor/Sensory Function: Mild impairment Facial ROM: Within Functional Limits Facial Symmetry: Within Functional Limits Facial Strength: Within Functional Limits Lingual ROM: Within Functional Limits Lingual Symmetry: Within Functional Limits Lingual Strength: Reduced;Suspected CN XII (hypoglossal) dysfunction Velum: Suspected CN X (Vagus) dysfunction (slight palatal deviation to the left upon phonation)   Ice Chips Ice chips: Not tested   Thin Liquid Thin Liquid: Impaired Presentation: Cup;Self Fed Oral Phase Impairments: Reduced labial seal;Reduced lingual movement/coordination Oral Phase Functional Implications: Left anterior spillage;Right anterior spillage Pharyngeal  Phase Impairments: Cough - Immediate    Nectar Thick Nectar Thick Liquid: Within functional limits Presentation: Cup;Self Fed   Honey Thick Honey Thick Liquid: Not tested   Puree Puree: Within functional limits Presentation: Self Fed;Spoon   Solid     Solid: Within functional limits      [name] 05/20/2022,12:04 PM  Kathleen Lime, MS [facility] SLP [place] Office [phone] Pager [phone]

## 2022-05-20 NOTE — Plan of Care (Signed)
?  Problem: Clinical Measurements: ?Goal: Will remain free from infection ?Outcome: Progressing ?  ?

## 2022-05-20 NOTE — Progress Notes (Signed)
Justin Baker ZPH:[id number] DOB: 08-29-1921 DOA: 05/18/2022 PCP: Patient, No Pcp Per   Subj:  88 y.o. WM PMHx Atrial fibrillation, SSS (s/p pacemaker), decreased EF,   pain presenting with altered mental status and multiple other complaints. Per family and chart review patient has productive cough for about 2 weeks with greenish to brown sputum production.  Intermittent chills but no reported fever.  Poor p.o. intake and decreased appetite.  He started becoming short of breath and complains of palpitations.  Also noted to have dysuria and increased urinary frequency for the past several days. Gradually worsening confusion and increased somnolence which is unusual for him.   ED course.  Afebrile with tachycardia and low 100s.  Hypoxic requiring up to 2 L of oxygen.  Labs pertinent for leukocytosis at 14.1, mild transaminitis, mildly elevated creatinine at 1.98 with baseline around 1.5.  Lactic acid normal.  BNP mildly elevated at 479.  Troponin 41>>37.  Influenza and COVID PCR negative. UA positive for hemoglobin, protein, small leukocytes and rare bacteria.  UDS negative. CXR with mild atelectasis versus scarring at bases. CT head was negative for any acute abnormality CTA was negative for PE but did show lower lobe infiltrates consistent with pneumonia as well as bronchial wall thickening and secretions in the trachea which could indicate aspiration. Also noted to have changes consistent with mild diverticulitis and bladder wall thickening consistent with cystitis.   9/27: Preliminary blood cultures negative.  Urine cultures were ordered but unfortunately lab did not had the sample, they were asking for new order entry new sample but patient is already on antibiotics.  UA with very few leukocytes which should be covered with current antibiotic for concern of colitis. Procalcitonin negative, most likely no pneumonia. Clinically feels improving as there was no belly pain today and coughing has been  improved. Ordered PT/OT evaluation.   Patient is very high risk for mortality based on age and underlying comorbidities.  Palliative care was also consulted.    Obj: 9/28 afebrile overnight, states he understands that most likely aspirating.  Understands this will give him pneumonia, states he is live long enough.   Objective: VITAL SIGNS: Temp: 97.6 F (36.4 C) (09/27 2055) Temp Source: Oral (09/27 2055) BP: 134/74 (09/27 2055) Pulse Rate: 72 (09/27 2055) SPO2; 98% FIO2: 2 L/min   Intake/Output Summary (Last 24 hours) at 05/20/2022 0815 Last data filed at 05/20/2022 8016 Gross per 24 hour  Intake 1004.66 ml  Output 300 ml  Net 704.66 ml     Exam: General: A/O x4 No acute respiratory distress Lungs: Clear to auscultation bilaterally without wheezes or crackles Cardiovascular: Regular rate and rhythm without murmur gallop or rub normal S1 and S2 Abdomen: Nontender, nondistended, soft, bowel sounds positive, no rebound, no ascites, no appreciable mass Extremities: No significant cyanosis, clubbing, or edema bilateral lower extremities Skin: Negative rashes, lesions, ulcers Psychiatric:  Negative depression, negative anxiety, negative fatigue, negative mania  Central nervous system:  Cranial nerves II through XII intact, tongue/uvula midline, all extremities muscle strength 5/5, sensation intact throughout, finger nose finger bilateral within normal limits, quick finger touch bilateral within normal limits, negative dysarthria, negative expressive aphasia, negative receptive aphasia.  .    Mobility Assessment (last 72 hours)     Mobility Assessment     Row Name 05/19/22 2152 05/19/22 1009 05/18/22 2016       Does patient have an order for bedrest or is patient medically unstable No - Continue assessment No - Continue assessment No -  Continue assessment     What is the highest level of mobility based on the progressive mobility assessment? Level 2 (Chairfast) - Balance  while sitting on edge of bed and cannot stand Level 2 (Chairfast) - Balance while sitting on edge of bed and cannot stand Level 2 (Chairfast) - Balance while sitting on edge of bed and cannot stand     Is the above level different from baseline mobility prior to current illness? Yes - Recommend PT order Yes - Recommend PT order Yes - Recommend PT order                DVT prophylaxis: Lovenox Code Status: DNR Family Communication:  Status is: Inpatient    Dispo: The patient is from:               Anticipated d/c is to:               Anticipated d/c date is:               Patient currently     Procedures/Significant Events:    Consultants:     Cultures   Antimicrobials:   A/P  Sepsis ([place]) Met sepsis criteria with leukocytosis, tachycardia and tachypnea.  Most likely GI source with concern of diverticulitis.  Preliminary blood cultures negative. There was also concern of UTI with very few leukocytosis on UA-unable to get urine culture as lab lost the sample, no need to send in new sample as patient is already on antibiotic. There was also concern of pneumonia but procalcitonin is negative. -Continue with ceftriaxone and Flagyl. -PT/OT evaluation -Palliative care consult -9/28 Albumin 25 g -9/28 0.45% saline 20ml/hr   Pneumonia of both lower lobes due to infectious organism CT with concern of bilateral infiltrate and diffuse bronchial wall thickening with secretions in trachea, concerning for aspiration pneumonia.  Procalcitonin negative. -Continue with ceftriaxone and Flagyl -Swallow evaluation   UTI (urinary tract infection) Concern of cystitis with diffuse bladder wall thickening on CT abdomen.  UA positive for only few leukocytes.  Urine culture was ordered but unfortunately sample got lost. -No need to send in new sample as patient is already on antibiotics. -Continue to monitor   AKI (acute kidney injury) ([place]) Most likely secondary to poor p.o. intake  and dehydration.  Creatinine improving with IV hydration, now close to baseline of 1.4-1.5. -Avoid nephrotoxins Lab Results  Component Value Date   CREATININE 1.51 (H) 05/20/2022   CREATININE 1.52 (H) 05/19/2022   CREATININE 1.98 (H) 05/18/2022   CREATININE 1.42 (H) 10/06/2021   CREATININE 1.47 (H) 06/08/2019  ]   Acute encephalopathy Improved.  Appears to be at baseline now.   Atrial fibrillation with rapid ventricular response (HCC) RVR in ED which improved with IV fluid and home metoprolol. Currently heart rate stable. -Continue with home metoprolol -Not on any anticoagulation at home based on age and risk of fall.  Also has an history of subdural hematoma in 2021.   Decreased cardiac ejection fraction Recent echocardiogram recorded in 2017 with normal EF and indeterminate diastolic function Currently appears euvolemic. Home Lasix is being held due to AKI and concern of sepsis. Uses Lasix as needed. -Continue to monitor   Sick sinus syndrome ([place]) S/p pacemaker in place   Hypokalemia Resolved Magnesium was normal. -Continue to monitor-replete as needed        Care during the described time interval was provided by me .  I have reviewed this patient's available data, including medical  history, events of note, physical examination, and all test results as part of my evaluation.

## 2022-05-20 NOTE — Evaluation (Signed)
Occupational Therapy Evaluation Patient Details Name: Justin Baker MRN: [id number] DOB: 03-Dec-1921 Today's Date: 05/20/2022   History of Present Illness 88 y.o. male with medical history significant of atrial fibrillation, sick sinus syndrome status post pacemaker, decreased EF, COPD, Covid, back pain and admitted for sepsis and pneumonia   Clinical Impression   Justin Baker is a 88 year old man who presents with above medical history as well as generalized weakness, decreased activity tolerance, impaired balance and cardiopulmonary endurance resulting in a decline in baseline functional abilities. On evaluation he is min assist to stand and take a couple of steps with walker and needs increased assistance with ADLs. Patient will benefit from skilled OT services while in hospital to improve deficits and learn compensatory strategies as needed in order to return to PLOF. Patient not agreeable to short term rehab at discharge and would rather go home. Reports he has 24/7 assist and would recover better at home.      Recommendations for follow up therapy are one component of a multi-disciplinary discharge planning process, led by the attending physician.  Recommendations may be updated based on patient status, additional functional criteria and insurance authorization.   Follow Up Recommendations  Home health OT    Assistance Recommended at Discharge Frequent or constant Supervision/Assistance  Patient can return home with the following A little help with walking and/or transfers;A lot of help with bathing/dressing/bathroom;Assistance with cooking/housework;Help with stairs or ramp for entrance    Functional Status Assessment  Patient has had a recent decline in their functional status and/or demonstrates limited ability to make significant improvements in function in a reasonable and predictable amount of time  Equipment Recommendations  None recommended by OT    Recommendations for Other  Services       Precautions / Restrictions Precautions Precautions: Fall Precaution Comments: on 2L O2 [place] currently Restrictions Weight Bearing Restrictions: No      Mobility Bed Mobility               General bed mobility comments: up in chair    Transfers Overall transfer level: Needs assistance Equipment used: Rolling walker (2 wheels) Transfers: Bed to chair/wheelchair/BSC, Sit to/from Stand Sit to Stand: Min assist, +2 safety/equipment     Step pivot transfers: Min assist, +2 safety/equipment     General transfer comment: Patient min assist to stand with verbal cues to use arms to push up with. Patient abel to take a couple of steps forward with walker but then needed to sit down. HR and o2 sat monitored and maintained WFL.      Balance Overall balance assessment: Needs assistance Sitting-balance support: No upper extremity supported, Feet supported Sitting balance-Leahy Scale: Good     Standing balance support: During functional activity, Reliant on assistive device for balance Standing balance-Leahy Scale: Poor                             ADL either performed or assessed with clinical judgement   ADL Overall ADL's : Needs assistance/impaired Eating/Feeding: Set up;Sitting   Grooming: Set up;Sitting   Upper Body Bathing: Set up;Sitting   Lower Body Bathing: Moderate assistance;Sit to/from stand   Upper Body Dressing : Set up;Sitting   Lower Body Dressing: Maximal assistance;Sit to/from stand   Toilet Transfer: Minimal assistance;BSC/3in1;Rolling walker (2 wheels)   Toileting- Clothing Manipulation and Hygiene: Maximal assistance;Sit to/from stand       Functional mobility during ADLs: Minimal  assistance;Rolling walker (2 wheels)       Vision Patient Visual Report: No change from baseline       Perception     Praxis      Pertinent Vitals/Pain Pain Assessment Pain Assessment: No/denies pain     Hand Dominance Right    Extremity/Trunk Assessment Upper Extremity Assessment Upper Extremity Assessment: Generalized weakness (WFL ROM, atleast 3+/5 strength)   Lower Extremity Assessment Lower Extremity Assessment: Defer to PT evaluation   Cervical / Trunk Assessment Cervical / Trunk Assessment: Kyphotic   Communication Communication Communication: HOH   Cognition Arousal/Alertness: Awake/alert Behavior During Therapy: WFL for tasks assessed/performed Overall Cognitive Status: Within Functional Limits for tasks assessed                                       General Comments       Exercises     Shoulder Instructions      Home Living Family/patient expects to be discharged to:: Private residence Living Arrangements: Children Available Help at Discharge: Family;Available 24 hours/day Type of Home: House       Home Layout: One level               Home Equipment: Conservation officer, nature (2 wheels);Wheelchair - manual   Additional Comments: difficult obtaining home information, pt with decreased cognition      Prior Functioning/Environment Prior Level of Function : Independent/Modified Independent             Mobility Comments: limited mobility with RW, mobilizes longer distances and onto deck with wc ADLs Comments: grossly independent with ADLs, performs sink baths, uses a sock aide        OT Problem List: Decreased strength;Decreased activity tolerance;Impaired balance (sitting and/or standing);Decreased knowledge of use of DME or AE;Cardiopulmonary status limiting activity;Obesity      OT Treatment/Interventions: Self-care/ADL training;Therapeutic exercise;DME and/or AE instruction;Therapeutic activities;Balance training;Patient/family education    OT Goals(Current goals can be found in the care plan section) Acute Rehab OT Goals Patient Stated Goal: to go home OT Goal Formulation: With patient Time For Goal Achievement: 06/03/22 Potential to Achieve Goals: Fair   OT Frequency: Min 2X/week    Co-evaluation              AM-PAC OT "6 Clicks" Daily Activity     Outcome Measure Help from another person eating meals?: A Little Help from another person taking care of personal grooming?: A Little Help from another person toileting, which includes using toliet, bedpan, or urinal?: A Lot Help from another person bathing (including washing, rinsing, drying)?: A Lot Help from another person to put on and taking off regular upper body clothing?: A Little Help from another person to put on and taking off regular lower body clothing?: A Lot 6 Click Score: 15   End of Session Equipment Utilized During Treatment: Rolling walker (2 wheels);Oxygen Nurse Communication: Mobility status  Activity Tolerance: Patient tolerated treatment well Patient left: in chair;with call bell/phone within reach;with chair alarm set  OT Visit Diagnosis: Muscle weakness (generalized) (M62.81);Unsteadiness on feet (R26.81)                Time: 4401-0272 OT Time Calculation (min): 22 min Charges:  OT General Charges $OT Visit: 1 Visit OT Evaluation $OT Eval Low Complexity: 1 Low  Gustavo Lah, OTR/L [place]  Office [phone]   Lenward Chancellor 05/20/2022, 5:43 PM

## 2022-05-20 NOTE — Evaluation (Signed)
Physical Therapy Evaluation Patient Details Name: Justin Baker MRN: [id number] DOB: 05/28/1922 Today's Date: 05/20/2022  History of Present Illness  88 y.o. male with medical history significant of atrial fibrillation, sick sinus syndrome status post pacemaker, decreased EF, COPD, Covid, back pain and admitted for sepsis and pneumonia  Clinical Impression  Pt admitted with above diagnosis.  Pt currently with functional limitations due to the deficits listed below (see PT Problem List). Pt will benefit from skilled PT to increase their independence and safety with mobility to allow discharge to the venue listed below.  Pt presents with decreased cognition however agreeable to mobilize.  Pt requiring increased time to process/understand request of transferring to recliner.  Pt assisted to recliner and then palliative care entered to speak with pt.  Pt likely would be able to ambulate short distance so will attempt to increase mobility next session in preparation for d/c home.        Recommendations for follow up therapy are one component of a multi-disciplinary discharge planning process, led by the attending physician.  Recommendations may be updated based on patient status, additional functional criteria and insurance authorization.  Follow Up Recommendations Home health PT      Assistance Recommended at Discharge Frequent or constant Supervision/Assistance  Patient can return home with the following  A little help with walking and/or transfers;A little help with bathing/dressing/bathroom;Help with stairs or ramp for entrance    Equipment Recommendations None recommended by PT  Recommendations for Other Services       Functional Status Assessment Patient has had a recent decline in their functional status and demonstrates the ability to make significant improvements in function in a reasonable and predictable amount of time.     Precautions / Restrictions Precautions Precautions:  Fall Precaution Comments: on 2L O2 [place] currently      Mobility  Bed Mobility Overal bed mobility: Needs Assistance Bed Mobility: Supine to Sit     Supine to sit: Min assist, HOB elevated     General bed mobility comments: assist for trunk upright    Transfers Overall transfer level: Needs assistance Equipment used: Rolling walker (2 wheels) Transfers: Sit to/from Stand, Bed to chair/wheelchair/BSC Sit to Stand: Min assist, +2 safety/equipment   Step pivot transfers: Min assist, +2 safety/equipment       General transfer comment: pt talking about weight with attempts to transfer so requested RN step in room to provide +2 for safety as pt appeared to want to pull up on RW to self assist into standing, min assist for rise and steady; pt remained on 2L O2 [place] and presented with increased work of breathing with transfer    Ambulation/Gait                  Stairs            Wheelchair Mobility    Modified Rankin (Stroke Patients Only)       Balance Overall balance assessment: Needs assistance         Standing balance support: Bilateral upper extremity supported, Reliant on assistive device for balance Standing balance-Leahy Scale: Poor                               Pertinent Vitals/Pain      Home Living Family/patient expects to be discharged to:: Private residence Living Arrangements: Children   Type of Home: House  Home Equipment: Agricultural consultant (2 wheels) Additional Comments: difficult obtaining home information, pt with decreased cognition    Prior Function Prior Level of Function : Needs assist             Mobility Comments: ambulator with RW per pt       Hand Dominance        Extremity/Trunk Assessment        Lower Extremity Assessment Lower Extremity Assessment: Generalized weakness    Cervical / Trunk Assessment Cervical / Trunk Assessment: Kyphotic  Communication   Communication: HOH   Cognition Arousal/Alertness: Awake/alert Behavior During Therapy: WFL for tasks assessed/performed Overall Cognitive Status: No family/caregiver present to determine baseline cognitive functioning                                 General Comments: pt reorientated to hospital, he thought he was at home, pt at times making nonsensical statements but also seems [facility]        General Comments      Exercises     Assessment/Plan    PT Assessment Patient needs continued PT services  PT Problem List Decreased mobility;Decreased balance;Decreased activity tolerance;Decreased strength;Decreased knowledge of precautions;Cardiopulmonary status limiting activity;Decreased knowledge of use of DME;Decreased cognition       PT Treatment Interventions Gait training;DME instruction;Therapeutic exercise;Balance training;Functional mobility training;Therapeutic activities;Patient/family education    PT Goals (Current goals can be found in the Care Plan section)  Acute Rehab PT Goals PT Goal Formulation: Patient unable to participate in goal setting Time For Goal Achievement: 06/03/22 Potential to Achieve Goals: Good    Frequency Min 3X/week     Co-evaluation               AM-PAC PT "6 Clicks" Mobility  Outcome Measure Help needed turning from your back to your side while in a flat bed without using bedrails?: A Little Help needed moving from lying on your back to sitting on the side of a flat bed without using bedrails?: A Little Help needed moving to and from a bed to a chair (including a wheelchair)?: A Little Help needed standing up from a chair using your arms (e.g., wheelchair or bedside chair)?: A Little Help needed to walk in hospital room?: A Lot Help needed climbing 3-5 steps with a railing? : A Lot 6 Click Score: 16    End of Session Equipment Utilized During Treatment: Gait belt Activity Tolerance: Patient tolerated treatment well Patient left: in chair;with  call bell/phone within reach (with palliative care, RN aware to recheck chair alarm) Nurse Communication: Mobility status PT Visit Diagnosis: Other abnormalities of gait and mobility (R26.89)    Time: 5329-9242 PT Time Calculation (min) (ACUTE ONLY): 12 min   Charges:   PT Evaluation $PT Eval Low Complexity: 1 Low         Kati PT, DPT Physical Therapist Acute Rehabilitation Services Preferred contact method: Secure Chat Weekend Pager Only: [phone] Office: [phone]   Janan Halter [place] 05/20/2022, 2:53 PM

## 2022-05-20 NOTE — Progress Notes (Signed)
Lactic 2.2 @13 :03 MD Sherral Hammers notified Patient refused further lab draws today when phlebotomy came to re-check. MD Sherral Hammers notified.

## 2022-05-20 NOTE — Consult Note (Signed)
Consultation Note Date: 05/20/2022   Patient Name: Justin Baker  DOB: [birth year]  MRN: [id number]  Age / Sex: 88 y.o., male  PCP: Patient, No Pcp Per Referring Physician: Allie Bossier, MD  Reason for Consultation:   HPI/Patient Profile: 88 y.o. male  with past medical history of atrial fibrillation, SSS s/p pacemaker, Hf recovered EF, who presented with AMS admitted on 05/18/2022 with sepsis 2/2 to diverticulitis versus UTI.   Primary Decision Maker HCPOA, Sons Justin Baker and Justin Baker  Discussion: I met with patient at bedside. He just finished working with physical therapy and is resting in chair. He has a short attention span and is easily distracted. He is oriented to self only. Reports that his son brought him to hospital to help him recover. He feels like he is getting better.   I attempted to call and talk with patient's son Justin Baker, but reached Justin Baker his wife. She reports that prior to coming to hospital that patient did not have an memory problems. Over the last few weeks, patient has had more difficulty ambulating. Justin Baker, his son lives with him but has health conditions that do not allow him to lift patient. Patient has previously had to stay in SNF for rehabilitation and expressed that he would never want to be in one long term. Sons, Justin Baker and Justin Baker, have POA and documentation is in paper chart.   SUMMARY OF RECOMMENDATIONS   Dysphagia Speech evaluated and recommending barium swallow.  Delirium At baseline patient is fully oriented per family. I think that delirium is likely multifactorial in setting of infection and new environment. -continue with antibiotics -delirium precautions  GOC: goal is for patient to continue with antibiotics. Family would like him to go to rehab to regain strength prior to returning home with his son Justin Baker if able. Barium swallow planned. Current PT recommendations for home  health PT.  Code Status/Advance Care Planning: DNR   Prognosis:   Unable to determine  Discharge Planning: To Be Determined  Primary Diagnoses: Present on Admission:  Pneumonia of both lower lobes due to infectious organism  Decreased cardiac ejection fraction  Atrial fibrillation with rapid ventricular response (HCC)  Sick sinus syndrome (HCC)  UTI (urinary tract infection)  AKI (acute kidney injury) ([place])  Hypokalemia  Sepsis ([place])   Review of Systems  Constitutional:  Negative for activity change.    Physical Exam HENT:     Head: Normocephalic and atraumatic.     Mouth/Throat:     Mouth: Mucous membranes are moist.     Pharynx: Oropharynx is clear.  Cardiovascular:     Rate and Rhythm: Normal rate and regular rhythm.     Heart sounds: Normal heart sounds.  Pulmonary:     Effort: Pulmonary effort is normal.     Breath sounds: Normal breath sounds.  Neurological:     Mental Status: He is alert.     Comments: Oriented to self, but not place or reason for being in hospital. He reports that we are in [place].  Vital Signs: BP (!) 125/101 (BP Location: Left Arm)   Pulse 71   Temp 97.9 F (36.6 C) (Oral)   Resp 20   Ht _0  (1.753 m)   Wt 89 kg   SpO2 95%   BMI 28.98 kg/m  Pain Scale: 0-10   Pain Score: 0-No pain   SpO2: SpO2: 95 % O2 Device:SpO2: 95 % O2 Flow Rate: .O2 Flow Rate (L/min): 2 L/min  IO: Intake/output summary:  Intake/Output Summary (Last 24 hours) at 05/20/2022 1028 Last data filed at 05/20/2022 1009 Gross per 24 hour  Intake 1244.66 ml  Output 300 ml  Net 944.66 ml    LBM: Last BM Date : 05/18/22 Baseline Weight: Weight: 86.2 kg Most recent weight: Weight: 89 kg       Thank you for this consult. Palliative medicine will continue to follow and assist as needed. Will plan to reach out to patient's son again tomorrow.   Signed by: Daleen Bo. Alainah Phang, D.O.  Internal Medicine Resident, PGY-2 [facility]    Please contact Palliative Medicine Team phone at [phone] for questions and concerns.  For individual provider: See Shea Evans

## 2022-05-21 DIAGNOSIS — N179 Acute kidney failure, unspecified: Secondary | ICD-10-CM | POA: Diagnosis not present

## 2022-05-21 DIAGNOSIS — I4891 Unspecified atrial fibrillation: Secondary | ICD-10-CM | POA: Diagnosis not present

## 2022-05-21 DIAGNOSIS — G9340 Encephalopathy, unspecified: Secondary | ICD-10-CM | POA: Diagnosis not present

## 2022-05-21 DIAGNOSIS — A419 Sepsis, unspecified organism: Secondary | ICD-10-CM | POA: Diagnosis not present

## 2022-05-21 LAB — CBC WITH DIFFERENTIAL/PLATELET
Abs Immature Granulocytes: 0.19 10*3/uL — ABNORMAL HIGH (ref 0.00–0.07)
Basophils Absolute: 0 10*3/uL (ref 0.0–0.1)
Basophils Relative: 0 %
Eosinophils Absolute: 0.2 10*3/uL (ref 0.0–0.5)
Eosinophils Relative: 2 %
HCT: 38.1 % — ABNORMAL LOW (ref 39.0–52.0)
Hemoglobin: 12.2 g/dL — ABNORMAL LOW (ref 13.0–17.0)
Immature Granulocytes: 2 %
Lymphocytes Relative: 14 %
Lymphs Abs: 1.5 10*3/uL (ref 0.7–4.0)
MCH: 33.2 pg (ref 26.0–34.0)
MCHC: 32 g/dL (ref 30.0–36.0)
MCV: 103.8 fL — ABNORMAL HIGH (ref 80.0–100.0)
Monocytes Absolute: 0.5 10*3/uL (ref 0.1–1.0)
Monocytes Relative: 5 %
Neutro Abs: 8.4 10*3/uL — ABNORMAL HIGH (ref 1.7–7.7)
Neutrophils Relative %: 77 %
Platelets: 200 10*3/uL (ref 150–400)
RBC: 3.67 MIL/uL — ABNORMAL LOW (ref 4.22–5.81)
RDW: 13.6 % (ref 11.5–15.5)
WBC: 10.8 10*3/uL — ABNORMAL HIGH (ref 4.0–10.5)
nRBC: 0 % (ref 0.0–0.2)

## 2022-05-21 MED ORDER — LEVALBUTEROL HCL 0.63 MG/3ML IN NEBU
0.6300 mg | INHALATION_SOLUTION | Freq: Four times a day (QID) | RESPIRATORY_TRACT | Status: AC | PRN
  Administered 2022-05-21: 0.63 mg via RESPIRATORY_TRACT
  Filled 2022-05-21: qty 3

## 2022-05-21 MED ORDER — SODIUM CHLORIDE 3 % IN NEBU
4.0000 mL | INHALATION_SOLUTION | Freq: Two times a day (BID) | RESPIRATORY_TRACT | Status: AC
  Administered 2022-05-21 – 2022-05-24 (×6): 4 mL via RESPIRATORY_TRACT
  Filled 2022-05-21 (×6): qty 4

## 2022-05-21 MED ORDER — IPRATROPIUM-ALBUTEROL 0.5-2.5 (3) MG/3ML IN SOLN
3.0000 mL | Freq: Four times a day (QID) | RESPIRATORY_TRACT | Status: DC
  Administered 2022-05-21 (×2): 3 mL via RESPIRATORY_TRACT
  Filled 2022-05-21 (×2): qty 3

## 2022-05-21 MED ORDER — SODIUM CHLORIDE 3 % IN NEBU
4.0000 mL | INHALATION_SOLUTION | Freq: Two times a day (BID) | RESPIRATORY_TRACT | Status: DC
  Filled 2022-05-21: qty 4

## 2022-05-21 MED ORDER — IPRATROPIUM-ALBUTEROL 0.5-2.5 (3) MG/3ML IN SOLN
3.0000 mL | Freq: Three times a day (TID) | RESPIRATORY_TRACT | Status: DC
  Administered 2022-05-22 – 2022-05-24 (×9): 3 mL via RESPIRATORY_TRACT
  Filled 2022-05-21 (×10): qty 3

## 2022-05-21 NOTE — Progress Notes (Signed)
Mr. Stradling, myself, Dr. Sherral Hammers & patients son all had conversation today to discuss plan of care & goals. Patient expressed that he was agreeable to have lab work drawn as part of his treatment. Unfortunately, later this afternoon once attempt was made to collect labs he became very frightened & paranoid saying "she was trying to kill me" & he pulled his IV out- IVF's held at this time/lost IV access unable to restart new at this time will need to evaluate after pt settles.  Patient was reassured & calmed by myself & NT. Family was called for update (spoke w/ Christie/daughter-in-law) & to speak to patient. Transferred pt from chair back to bed. Bed alarm on & call bell within reach.

## 2022-05-21 NOTE — Progress Notes (Signed)
Justin Baker IRJ:[id number] DOB: 1921/09/21 DOA: 05/18/2022 PCP: Patient, No Pcp Per   Subj:  88 y.o. WM PMHx Atrial fibrillation, SSS (s/p pacemaker), decreased EF,   pain presenting with altered mental status and multiple other complaints. Per family and chart review patient has productive cough for about 2 weeks with greenish to brown sputum production.  Intermittent chills but no reported fever.  Poor p.o. intake and decreased appetite.  He started becoming short of breath and complains of palpitations.  Also noted to have dysuria and increased urinary frequency for the past several days. Gradually worsening confusion and increased somnolence which is unusual for him.   ED course.  Afebrile with tachycardia and low 100s.  Hypoxic requiring up to 2 L of oxygen.  Labs pertinent for leukocytosis at 14.1, mild transaminitis, mildly elevated creatinine at 1.98 with baseline around 1.5.  Lactic acid normal.  BNP mildly elevated at 479.  Troponin 41>>37.  Influenza and COVID PCR negative. UA positive for hemoglobin, protein, small leukocytes and rare bacteria.  UDS negative. CXR with mild atelectasis versus scarring at bases. CT head was negative for any acute abnormality CTA was negative for PE but did show lower lobe infiltrates consistent with pneumonia as well as bronchial wall thickening and secretions in the trachea which could indicate aspiration. Also noted to have changes consistent with mild diverticulitis and bladder wall thickening consistent with cystitis.   9/27: Preliminary blood cultures negative.  Urine cultures were ordered but unfortunately lab did not had the sample, they were asking for new order entry new sample but patient is already on antibiotics.  UA with very few leukocytes which should be covered with current antibiotic for concern of colitis. Procalcitonin negative, most likely no pneumonia. Clinically feels improving as there was no belly pain today and coughing has been  improved. Ordered PT/OT evaluation.   Patient is very high risk for mortality based on age and underlying comorbidities.  Palliative care was also consulted.   Obj: 9/29 afebrile overnight A/O x4.  No labs this a.m. secondary to multiple sticks.  Patient also refused MBS.  Positive SOB, negative CP.     Objective: VITAL SIGNS: Temp: 98 F (36.7 C) (09/29 0609) Temp Source: Oral (09/29 0609) BP: 127/77 (09/29 0609) Pulse Rate: 91 (09/29 0609) SPO2; 97% FIO2: 2 L/min   Intake/Output Summary (Last 24 hours) at 05/21/2022 1137 Last data filed at 05/21/2022 1105 Gross per 24 hour  Intake 1210.25 ml  Output 700 ml  Net 510.25 ml      Exam: General: A/O x4 No acute respiratory distress Lungs: tachypneic decreased breath sounds, positive rhonchorous bilaterally without wheezes or crackles Cardiovascular: Regular rate and rhythm without murmur gallop or rub normal S1 and S2 Abdomen: Nontender, nondistended, soft, bowel sounds positive, no rebound, no ascites, no appreciable mass Extremities: No significant cyanosis, clubbing, or edema bilateral lower extremities Skin: Negative rashes, lesions, ulcers Psychiatric:  Negative depression, negative anxiety, negative fatigue, negative mania  Central nervous system:  Cranial nerves II through XII intact, tongue/uvula midline, all extremities muscle strength 5/5, sensation intact throughout, finger nose finger bilateral within normal limits, quick finger touch bilateral within normal limits, negative dysarthria, negative expressive aphasia, negative receptive aphasia.  .    Mobility Assessment (last 72 hours)     Mobility Assessment     Row Name 05/21/22 1037 05/20/22 1741 05/20/22 1445 05/20/22 1009 05/20/22 0800   Does patient have an order for bedrest or is patient medically unstable -- -- --  No - Continue assessment No - Continue assessment   What is the highest level of mobility based on the progressive mobility assessment?  Level 3 (Stands with assist) - Balance while standing  and cannot march in place Level 3 (Stands with assist) - Balance while standing  and cannot march in place Level 3 (Stands with assist) - Balance while standing  and cannot march in place Level 2 ([place]) - Balance while sitting on edge of bed and cannot stand Level 2 (Chairfast) - Balance while sitting on edge of bed and cannot stand   Is the above level different from baseline mobility prior to current illness? -- -- -- Yes - Recommend PT order --    Row Name 05/19/22 2152 05/19/22 1009 05/18/22 2016       Does patient have an order for bedrest or is patient medically unstable No - Continue assessment No - Continue assessment No - Continue assessment     What is the highest level of mobility based on the progressive mobility assessment? Level 2 (Chairfast) - Balance while sitting on edge of bed and cannot stand Level 2 (Chairfast) - Balance while sitting on edge of bed and cannot stand Level 2 (Chairfast) - Balance while sitting on edge of bed and cannot stand     Is the above level different from baseline mobility prior to current illness? Yes - Recommend PT order Yes - Recommend PT order Yes - Recommend PT order                DVT prophylaxis: Lovenox Code Status: DNR Family Communication: 9/29 spoke at length with [name] (son) discussed plan of care answered all questions. Status is: Inpatient    Dispo: The patient is from:               Anticipated d/c is to:               Anticipated d/c date is:               Patient currently     Procedures/Significant Events:    Consultants:  Palliative care   Cultures   Antimicrobials: Anti-infectives (From admission, onward)    Start     Dose/Rate Route Frequency Ordered Stop   05/18/22 1900  metroNIDAZOLE (FLAGYL) IVPB 500 mg        500 mg 100 mL/hr over 60 Minutes Intravenous Every 12 hours 05/18/22 1837     05/18/22 0400  metroNIDAZOLE (FLAGYL) IVPB 500 mg         500 mg 100 mL/hr over 60 Minutes Intravenous  Once 05/18/22 0345 05/18/22 0645   05/18/22 0315  cefTRIAXone (ROCEPHIN) 2 g in sodium chloride 0.9 % 100 mL IVPB        2 g 200 mL/hr over 30 Minutes Intravenous Every 24 hours 05/18/22 0302 05/23/22 0559   05/18/22 0315  azithromycin (ZITHROMAX) 500 mg in sodium chloride 0.9 % 250 mL IVPB        500 mg 250 mL/hr over 60 Minutes Intravenous Every 24 hours 05/18/22 0302 05/23/22 0559        A/P  Sepsis ([place]) Met sepsis criteria with leukocytosis, tachycardia and tachypnea.  Most likely GI source with concern of diverticulitis.  Preliminary blood cultures negative. There was also concern of UTI with very few leukocytosis on UA-unable to get urine culture as lab lost the sample, no need to send in new sample as patient is already on antibiotic. There was also concern  of pneumonia but procalcitonin is negative. -Continue with ceftriaxone and Flagyl. -PT/OT evaluation -Palliative care consult -9/28 Albumin 25 g -9/28 0.45% saline 2ml/hr -9/29 DC 0.45% saline   Pneumonia of both lower lobes due to infectious organism CT with concern of bilateral infiltrate and diffuse bronchial wall thickening with secretions in trachea, concerning for aspiration pneumonia.  Procalcitonin negative. -Continue with ceftriaxone and Flagyl -Swallow evaluation -9/29 flutter valve - 9/29 incentive spirometry - 9/29Hypertonic saline nebulizer BID -9/29 DuoNeb QID   UTI (urinary tract infection) Concern of cystitis with diffuse bladder wall thickening on CT abdomen.  UA positive for only few leukocytes.  Urine culture was ordered but unfortunately sample got lost. -No need to send in new sample as patient is already on antibiotics. -Continue to monitor   AKI (acute kidney injury) ([place]) Most likely secondary to poor p.o. intake and dehydration.  Creatinine improving with IV hydration, now close to baseline of 1.4-1.5. -Avoid nephrotoxins Lab Results   Component Value Date   CREATININE 1.51 (H) 05/20/2022   CREATININE 1.52 (H) 05/19/2022   CREATININE 1.98 (H) 05/18/2022   CREATININE 1.42 (H) 10/06/2021   CREATININE 1.47 (H) 06/08/2019  -9/29 at baseline   Acute encephalopathy -At baseline per son [name]    Atrial fibrillation with rapid ventricular response (HCC) -RVR in ED which improved with IV fluid and home metoprolol. -9/29 NSR. -Continue with home metoprolol -Not on any anticoagulation at home based on age and risk of fall.  Also has an history of subdural hematoma in 2021.   Decreased cardiac ejection fraction Recent echocardiogram recorded in 2017 with normal EF and indeterminate diastolic function Currently appears euvolemic. Home Lasix is being held due to AKI and concern of sepsis. Uses Lasix as needed. -Continue to monitor   Sick sinus syndrome [facility]) S/p pacemaker in place   Hypokalemia Resolved Magnesium was normal. -Continue to monitor-replete as needed   Goals of care - 9/29 Palliative Care Consult: Patient understands that he is most likely aspirating discuss change of status to comfort care.  Allowing patient to live out remainder of life with quality. -9/29 son [name]) would like patient to attend rehab prior to returning home if he qualifies. -9/29 [facility] consult: Request to have patient discharged to SNF     Care during the described time interval was provided by me .  I have reviewed this patient's available data, including medical history, events of note, physical examination, and all test results as part of my evaluation.

## 2022-05-21 NOTE — Progress Notes (Signed)
Speech Language Pathology Treatment: Dysphagia  Patient Details Name: Justin Baker MRN: [id number] DOB: 1922-05-05 Today's Date: 05/21/2022 Time: 5726-2035 SLP Time Calculation (min) (ACUTE ONLY): 24 min  Assessment / Plan / Recommendation Clinical Impression  Patient seen for skilled SLP to determine tolerance of po diet, discuss with pt potential MBS and continue to attempt to elucidate source of likely aspiration pna.   Pt states "I've already thought about it, and I am going to stick with where I am" indicating he did not desire MBS to be conducted.    SLP observed pt consuming water with his medications without any difficulties.  He reports good intake and less coughing overall - denies coughing associated with po intake.  Pt reports recall of eating spicy spaghetti sauce prior to significant reflux incident the week prior to admit.  He reported he usually avoids spicy items due to reflux. RN further reports pt consumed all of his breakfast today.   After further discussion with pt and his desire not to proceed with MBS, SLP agrees that it would likely not change his outcomes.  Provided pt with reflux precautions in writing using teach back with excellent recall and understanding.  Advised pt if he has recurrent pneumonias, unintentional weight loss, worsening symptoms of reflux or esophageal dysphagia - advised he speak to his primary MD.  He was agreeable to plan - If instrumental evaluation indicated in the future, an esophagram may be the most useful test.    All education completed, no SLP follow up needed.    HPI HPI: Pt is a 88 yo male adm to [facility] with confusion - found to have pna.  PMH + for ARF (acute renal failure) , CVAs *left pareital temporal*, SAH,  Atrial fibrillation (HCC) Back pain, COPD (chronic obstructive pulmonary disease)- stopped smoking 35 years ago, OEA right hip replacement, chest pain, shoulder pain, tachybrady syndrome, thrombocytopena, viral respiratory illness  06/2014. Poor intake PTA. Pt reports he had speech deficits with prior CVA, uses a wheelchair at home- does not ambulate.     CT of chest showed Infiltrates in the lower lungs compatible with pneumonia. Diffuse bronchial wall thickening with secretions in the trachea. Consider aspiration as potential etiology. 05/19/2022, also concern for Possible mild colonic diverticulitis at the descending/sigmoid junction.4. Diffuse bladder wall thickening. Correlation with urinalysis is recommended to exclude cystitis.      SLP Plan  All goals met      Recommendations for follow up therapy are one component of a multi-disciplinary discharge planning process, led by the attending physician.  Recommendations may be updated based on patient status, additional functional criteria and insurance authorization.    Recommendations  Diet recommendations: Regular;Thin liquid Liquids provided via: Cup;Straw Medication Administration: Whole meds with liquid Supervision: Patient able to self feed Compensations: Slow rate;Small sips/bites Postural Changes and/or Swallow Maneuvers: Seated upright 90 degrees;Upright 30-60 min after meal                Oral Care Recommendations: Oral care BID Follow Up Recommendations: Follow physician's recommendations for discharge plan and follow up therapies Assistance recommended at discharge: Intermittent Supervision/Assistance SLP Visit Diagnosis: Dysphagia, unspecified (R13.10);Dysphagia, oropharyngeal phase (R13.12) Plan: All goals met          Justin Lime, MS [facility] SLP Acute Rehab Services Office [phone] Pager [phone]  Justin Baker  05/21/2022, 5:07 PM

## 2022-05-21 NOTE — Progress Notes (Signed)
Physical Therapy Treatment Patient Details Name: Justin Baker MRN: [id number] DOB: March 27, 1922 Today's Date: 05/21/2022   History of Present Illness 88 y.o. male with medical history significant of atrial fibrillation, sick sinus syndrome status post pacemaker, decreased EF, COPD, Covid, back pain and admitted for sepsis and pneumonia    PT Comments    General Comments: AxO x 3 very pleasant but very HOH.  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  Pt stated he plays it smart.  "I use my wheelchair to scoot myself room to room".  Pt knows he is weak and does not want to risk falling. Pt was OOB in recliner.  General transfer comment: Patient min assist to stand with verbal cues to use arms to push up with. Pt admits to fear of using walker cause "it's not stable".  Pt asked Therapist to "hold walker down good" as he transfered hands from recliner to walker. General Gait Details: Very limited amb distance due to B LE weakness (L>R) and fear of falling.  Monitoring sats.  Trial RA decreased from 92 at rest to 86%. Pt plans to return home with Son.  Pt will need HH PT.  No equipment.  SATURATION QUALIFICATIONS: (This note is used to comply with regulatory documentation for home oxygen)  Patient Saturations on Room Air at Rest = 92%  Patient Saturations on Room Air while Ambulating 2 feet= 86%  Patient Saturations on 2 Liters of oxygen while Ambulating = 90%  Please briefly explain why patient needs home oxygen:  Pt required supplemental oxygen with activity to achieve therapeutic levels.    Recommendations for follow up therapy are one component of a multi-disciplinary discharge planning process, led by the attending physician.  Recommendations may be updated based on patient status, additional functional criteria and insurance authorization.  Follow Up Recommendations  Home health PT      Assistance Recommended at Discharge Frequent or constant Supervision/Assistance  Patient can return home with the following A little help with walking and/or transfers;A little help with bathing/dressing/bathroom;Help with stairs or ramp for entrance   Equipment Recommendations  None recommended by PT    Recommendations for Other Services       Precautions / Restrictions Precautions Precautions: Fall Precaution Comments: on 2L O2 [place] currently Restrictions Weight Bearing Restrictions: No     Mobility  Bed Mobility               General bed mobility comments: OOB in recliner    Transfers Overall transfer level: Needs assistance Equipment used: Rolling walker (2 wheels) Transfers: Bed to chair/wheelchair/BSC, Sit to/from Stand Sit to Stand: Min assist           General transfer comment: Patient min assist to stand with verbal cues to use arms to push up with. Pt admits to fear of using walker cause "it's not stable".  Pt asked Therapist to "hold walker down good" as he transfered hands from recliner to walker.    Ambulation/Gait Ambulation/Gait assistance: Mod assist Gait Distance (Feet): 2 Feet Assistive device: Rolling walker (2 wheels) Gait Pattern/deviations: Step-to pattern, Decreased step length - left, Decreased step length - right Gait velocity: decreased     General Gait Details: Very limited amb distance due to B LE weakness (L>R) and fear of falling.  Monitoring sats.  Trial RA decreased from 92 at rest to 86%.  Stairs             Wheelchair Mobility    Modified Rankin (Stroke Patients Only)       Balance                                            Cognition Arousal/Alertness: Awake/alert Behavior During Therapy: WFL for tasks assessed/performed Overall Cognitive Status: Within Functional Limits for tasks assessed                                 General Comments: AxO x 3 very pleasant but very HOH.   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  Pt stated he plays it smart.  "I use my wheelchair to scoot myself room to room".  Pt knows he is weak and does not want to risk falling.        Exercises      General Comments        Pertinent Vitals/Pain Pain Assessment Pain Assessment: Faces Faces Pain Scale: Hurts a little bit Pain Location: general Pain Descriptors / Indicators: Aching Pain Intervention(s): Monitored during session, Repositioned    Home Living                          Prior Function            PT Goals (current goals can now be found in the care plan section) Progress towards PT goals: Progressing toward goals    Frequency    Min 3X/week      PT Plan Current plan remains appropriate    Co-evaluation              AM-PAC PT "6 Clicks" Mobility   Outcome Measure  Help needed turning from your back to your side while in a flat bed without using bedrails?: A Little Help needed moving from lying on your back to sitting on the side of a flat bed without using bedrails?: A Little Help needed moving to and from a bed to a chair (including a wheelchair)?: A Little Help needed standing up from a chair using your arms (e.g., wheelchair or bedside chair)?: A Little Help needed to walk in hospital room?: A Lot Help needed climbing 3-5 steps with a railing? : Total 6 Click Score: 15    End of Session Equipment Utilized During Treatment: Gait belt Activity Tolerance: Patient limited by fatigue Patient left: in chair;with call bell/phone within reach;with chair alarm set Nurse Communication: Mobility status PT Visit Diagnosis: Other abnormalities of gait and mobility (R26.89)     Time: 0998-3382 PT Time Calculation (min) (ACUTE ONLY): 24 min  Charges:  $Gait Training: 8-22 mins $Therapeutic Activity: 8-22 mins                     Felecia Shelling  PTA Acute  Rehabilitation Services Office M-F          [phone] Weekend pager [phone]

## 2022-05-22 DIAGNOSIS — G9340 Encephalopathy, unspecified: Secondary | ICD-10-CM | POA: Diagnosis not present

## 2022-05-22 DIAGNOSIS — A419 Sepsis, unspecified organism: Secondary | ICD-10-CM | POA: Diagnosis not present

## 2022-05-22 DIAGNOSIS — N179 Acute kidney failure, unspecified: Secondary | ICD-10-CM | POA: Diagnosis not present

## 2022-05-22 DIAGNOSIS — I4891 Unspecified atrial fibrillation: Secondary | ICD-10-CM | POA: Diagnosis not present

## 2022-05-22 LAB — COMPREHENSIVE METABOLIC PANEL
ALT: 28 U/L (ref 0–44)
AST: 23 U/L (ref 15–41)
Albumin: 2.8 g/dL — ABNORMAL LOW (ref 3.5–5.0)
Alkaline Phosphatase: 55 U/L (ref 38–126)
Anion gap: 5 (ref 5–15)
BUN: 28 mg/dL — ABNORMAL HIGH (ref 8–23)
CO2: 25 mmol/L (ref 22–32)
Calcium: 8.3 mg/dL — ABNORMAL LOW (ref 8.9–10.3)
Chloride: 111 mmol/L (ref 98–111)
Creatinine, Ser: 1.48 mg/dL — ABNORMAL HIGH (ref 0.61–1.24)
GFR, Estimated: 42 mL/min — ABNORMAL LOW (ref >60)
Glucose, Bld: 120 mg/dL — ABNORMAL HIGH (ref 70–99)
Potassium: 3.6 mmol/L (ref 3.5–5.1)
Sodium: 141 mmol/L (ref 135–145)
Total Bilirubin: 0.7 mg/dL (ref 0.3–1.2)
Total Protein: 5.6 g/dL — ABNORMAL LOW (ref 6.5–8.1)

## 2022-05-22 LAB — PHOSPHORUS: Phosphorus: 3.7 mg/dL (ref 2.5–4.6)

## 2022-05-22 LAB — MAGNESIUM: Magnesium: 1.9 mg/dL (ref 1.7–2.4)

## 2022-05-22 MED ORDER — ENSURE ENLIVE PO LIQD
237.0000 mL | Freq: Two times a day (BID) | ORAL | Status: DC
  Administered 2022-05-22 – 2022-05-25 (×6): 237 mL via ORAL

## 2022-05-22 NOTE — Progress Notes (Signed)
Justin Baker CZY:[id number] DOB: 05/09/1922 DOA: 05/18/2022 PCP: Patient, No Pcp Per   Subj:  88 y.o. WM PMHx Atrial fibrillation, SSS (s/p pacemaker), decreased EF,   pain presenting with altered mental status and multiple other complaints. Per family and chart review patient has productive cough for about 2 weeks with greenish to brown sputum production.  Intermittent chills but no reported fever.  Poor p.o. intake and decreased appetite.  He started becoming short of breath and complains of palpitations.  Also noted to have dysuria and increased urinary frequency for the past several days. Gradually worsening confusion and increased somnolence which is unusual for him.   ED course.  Afebrile with tachycardia and low 100s.  Hypoxic requiring up to 2 L of oxygen.  Labs pertinent for leukocytosis at 14.1, mild transaminitis, mildly elevated creatinine at 1.98 with baseline around 1.5.  Lactic acid normal.  BNP mildly elevated at 479.  Troponin 41>>37.  Influenza and COVID PCR negative. UA positive for hemoglobin, protein, small leukocytes and rare bacteria.  UDS negative. CXR with mild atelectasis versus scarring at bases. CT head was negative for any acute abnormality CTA was negative for PE but did show lower lobe infiltrates consistent with pneumonia as well as bronchial wall thickening and secretions in the trachea which could indicate aspiration. Also noted to have changes consistent with mild diverticulitis and bladder wall thickening consistent with cystitis.   9/27: Preliminary blood cultures negative.  Urine cultures were ordered but unfortunately lab did not had the sample, they were asking for new order entry new sample but patient is already on antibiotics.  UA with very few leukocytes which should be covered with current antibiotic for concern of colitis. Procalcitonin negative, most likely no pneumonia. Clinically feels improving as there was no belly pain today and coughing has been  improved. Ordered PT/OT evaluation.   Patient is very high risk for mortality based on age and underlying comorbidities.  Palliative care was also consulted.   Obj: 9/30 speech returned at 1730 on 9/29 again patient refused MBS.        Objective: VITAL SIGNS: Temp: 97.7 F (36.5 C) (09/30 1222) Temp Source: Oral (09/30 0447) BP: 122/86 (09/30 1222) Pulse Rate: 101 (09/30 1222) SPO2; 97% FIO2: 2 L/min   Intake/Output Summary (Last 24 hours) at 05/22/2022 1316 Last data filed at 05/22/2022 1304 Gross per 24 hour  Intake 840 ml  Output 750 ml  Net 90 ml      Exam: General: A/O x4 No acute respiratory distress Lungs: tachypneic decreased breath sounds, positive rhonchorous bilaterally without wheezes or crackles Cardiovascular: Regular rate and rhythm without murmur gallop or rub normal S1 and S2 Abdomen: Nontender, nondistended, soft, bowel sounds positive, no rebound, no ascites, no appreciable mass Extremities: No significant cyanosis, clubbing, or edema bilateral lower extremities Skin: Negative rashes, lesions, ulcers Psychiatric:  Negative depression, negative anxiety, negative fatigue, negative mania  Central nervous system:  Cranial nerves II through XII intact, tongue/uvula midline, all extremities muscle strength 5/5, sensation intact throughout, finger nose finger bilateral within normal limits, quick finger touch bilateral within normal limits, negative dysarthria, negative expressive aphasia, negative receptive aphasia.  .    Mobility Assessment (last 72 hours)     Mobility Assessment     Row Name 05/21/22 2000 05/21/22 1037 05/21/22 0830 05/20/22 1741 05/20/22 1445   Does patient have an order for bedrest or is patient medically unstable No - Continue assessment -- No - Continue assessment -- --  What is the highest level of mobility based on the progressive mobility assessment? Level 3 (Stands with assist) - Balance while standing  and cannot march in  place Level 3 (Stands with assist) - Balance while standing  and cannot march in place Level 3 (Stands with assist) - Balance while standing  and cannot march in place Level 3 (Stands with assist) - Balance while standing  and cannot march in place Level 3 (Stands with assist) - Balance while standing  and cannot march in place   Is the above level different from baseline mobility prior to current illness? Yes - Recommend PT order -- Yes - Recommend PT order -- --    Row Name 05/20/22 1009 05/20/22 0800 05/19/22 2152       Does patient have an order for bedrest or is patient medically unstable No - Continue assessment No - Continue assessment No - Continue assessment     What is the highest level of mobility based on the progressive mobility assessment? Level 2 (Chairfast) - Balance while sitting on edge of bed and cannot stand Level 2 (Chairfast) - Balance while sitting on edge of bed and cannot stand Level 2 (Chairfast) - Balance while sitting on edge of bed and cannot stand     Is the above level different from baseline mobility prior to current illness? Yes - Recommend PT order -- Yes - Recommend PT order                DVT prophylaxis: Lovenox Code Status: DNR Family Communication: 9/29 spoke at length with [name] (son) discussed plan of care answered all questions. Status is: Inpatient    Dispo: The patient is from:               Anticipated d/c is to:               Anticipated d/c date is:               Patient currently     Procedures/Significant Events:    Consultants:  Palliative care   Cultures   Antimicrobials: Anti-infectives (From admission, onward)    Start     Dose/Rate Route Frequency Ordered Stop   05/18/22 1900  metroNIDAZOLE (FLAGYL) IVPB 500 mg        500 mg 100 mL/hr over 60 Minutes Intravenous Every 12 hours 05/18/22 1837     05/18/22 0400  metroNIDAZOLE (FLAGYL) IVPB 500 mg        500 mg 100 mL/hr over 60 Minutes Intravenous  Once 05/18/22  0345 05/18/22 0645   05/18/22 0315  cefTRIAXone (ROCEPHIN) 2 g in sodium chloride 0.9 % 100 mL IVPB        2 g 200 mL/hr over 30 Minutes Intravenous Every 24 hours 05/18/22 0302 05/22/22 0535   05/18/22 0315  azithromycin (ZITHROMAX) 500 mg in sodium chloride 0.9 % 250 mL IVPB        500 mg 250 mL/hr over 60 Minutes Intravenous Every 24 hours 05/18/22 0302 05/22/22 0746        A/P  Sepsis ([place]) Met sepsis criteria with leukocytosis, tachycardia and tachypnea.  Most likely GI source with concern of diverticulitis.  Preliminary blood cultures negative. There was also concern of UTI with very few leukocytosis on UA-unable to get urine culture as lab lost the sample, no need to send in new sample as patient is already on antibiotic. There was also concern of pneumonia but procalcitonin is negative. -Continue with  ceftriaxone and Flagyl. -PT/OT evaluation -Palliative care consult -9/28 Albumin 25 g -9/28 0.45% saline 81m/hr -9/29 DC 0.45% saline   Pneumonia of both lower lobes due to infectious organism/acute respiratory failure with hypoxia CT with concern of bilateral infiltrate and diffuse bronchial wall thickening with secretions in trachea, concerning for aspiration pneumonia.  Procalcitonin negative. -Continue with ceftriaxone and Flagyl -Swallow evaluation -9/29 flutter valve - 9/29 incentive spirometry - 9/29Hypertonic saline nebulizer BID -9/29 DuoNeb QID SATURATION QUALIFICATIONS: (This note is used to comply with regulatory documentation for home oxygen) Patient Saturations on Room Air at Rest = 92% Patient Saturations on Room Air while Ambulating 2 feet= 86% Patient Saturations on 2 Liters of oxygen while Ambulating = 90% Please briefly explain why patient needs home oxygen:  Pt required supplemental oxygen with activity to achieve therapeutic levels.   -Patient meets requirement for home O2 - 2 L O2 titrate to maintain SPO2> 92% - Provide Inogen home O2 concentrator    UTI (urinary tract infection) Concern of cystitis with diffuse bladder wall thickening on CT abdomen.  UA positive for only few leukocytes.  Urine culture was ordered but unfortunately sample got lost. -No need to send in new sample as patient is already on antibiotics. -Continue to monitor   AKI (acute kidney injury) ([place] Most likely secondary to poor p.o. intake and dehydration.  Creatinine improving with IV hydration, now close to baseline of 1.4-1.5. -Avoid nephrotoxins Lab Results  Component Value Date   CREATININE 1.48 (H) 05/21/2022   CREATININE 1.51 (H) 05/20/2022   CREATININE 1.52 (H) 05/19/2022   CREATININE 1.98 (H) 05/18/2022   CREATININE 1.42 (H) 10/06/2021  -9/29 at baseline   Acute encephalopathy -At baseline per son MElta Guadeloupe   Atrial fibrillation with rapid ventricular response (HCC) -RVR in ED which improved with IV fluid and home metoprolol. -9/29 NSR. -Continue with home metoprolol -Not on any anticoagulation at home based on age and risk of fall.  Also has an history of subdural hematoma in 2021.   Decreased cardiac ejection fraction Recent echocardiogram recorded in 2017 with normal EF and indeterminate diastolic function Currently appears euvolemic. Home Lasix is being held due to AKI and concern of sepsis. Uses Lasix as needed. -Continue to monitor   Sick sinus syndrome ([facility] S/p pacemaker in place   Hypokalemia Resolved Magnesium was normal. -Continue to monitor-replete as needed   Goals of care - 9/29 Palliative Care Consult: Patient understands that he is most likely aspirating discuss change of status to comfort care.  Allowing patient to live out remainder of life with quality. -9/29 son ([name] would like patient to attend rehab prior to returning home if he qualifies. -[facility] to [facility]     Care during the described time interval was provided by me .  I have reviewed this patient's available data,  including medical history, events of note, physical examination, and all test results as part of my evaluation.

## 2022-05-22 NOTE — TOC Initial Note (Addendum)
Transition of Care [facility]) - Initial/Assessment Note    Patient Details  Name: Justin Baker MRN: [id number] Date of Birth: 01-25-1922  [facility]) CM/SW Contact:    Kimber Relic, LCSW Phone Number: 05/22/2022, 2:29 PM  Clinical Narrative:                 TOC contacted by Dr. Sherral Hammers and informed that the pt's family son, [name], is wanting the pt to go to SNF. Per Dr. Sherral Hammers and the pt's charts, the pt has been seen by PT twice and has also declined MBS test by SLP twice. Pt has [place] whom are his sons, elizandro laura and [place]. Sons are wanting the pt to be d/c to SNF. PT has seen and evaluated pt 2x and recommends HH PT. [name], who lives with the pt, states that the pt is total care and he is unable to provide the care his father needs. Per Palliative's note, [name] has medical issues that prevent him from being able to lift the pt. TOC will work the pt up for SNF. CSW explained that there is no guarantee the pt will be approved. [name] states that they have had to private pay in the past for their mother and are prepared to do the same for their father if insurance denies. The family prefers the pt goes to [facility] in [place] as he has been there for rehab in the past. If pt cannot be placed into SNF from IP - he will need to return home with [facility] Oxygen and the family should place in him from home with assistance from the pt's PCP. [name] was made aware and stated he understands. CSW also explained that if he is placed into SNF, it will only be for STR and not LTC. TOC following.    Expected Discharge Plan: [place] [facility] will attempt to have pt placed for SNF, if not, pt will return home w/HH.) Barriers to Discharge: Other (must enter comment) (PT has seen pt and is rec HHPT; however, family is stating the pt is total care and they are unable to provide the care te pt needs in the home.)   Patient Goals and CMS Choice  Sons, whom are [facility] POAs, wants the pt to go to  [place] for SNF.       Expected Discharge Plan and Services Expected Discharge Plan: [facility] will attempt to have pt placed for SNF, if not, pt will return home w/HH.) In-house Referral: Clinical Social Work, Hospice / Palliative Care Discharge Planning Services: CM Consult Post Acute Care Choice: [place] arrangements for the past 2 months: Single Family Home                                      Prior Living Arrangements/Services Living arrangements for the past 2 months: Single Family Home Lives with:: Adult Children Patient language and need for interpreter reviewed:: No        Need for Family Participation in Patient Care: Yes (Comment) Care giver support system in place?: Yes (comment)   Criminal Activity/Legal Involvement Pertinent to Current Situation/Hospitalization: No - Comment as needed  Activities of Daily Living Home Assistive Devices/Equipment: Grab bars in shower, Grab bars around toilet, Reacher, Shower chair with back, Wheelchair, Other (Comment) (ramps) ADL Screening (condition at time of admission) Patient's cognitive ability adequate  to safely complete daily activities?: Yes Is the patient deaf or have difficulty hearing?: Yes Does the patient have difficulty seeing, even when wearing glasses/contacts?: No Does the patient have difficulty concentrating, remembering, or making decisions?: No Patient able to express need for assistance with ADLs?: Yes Does the patient have difficulty dressing or bathing?: Yes Independently performs ADLs?: No Communication: Independent Dressing (OT): Needs assistance Is this a change from baseline?: Change from baseline, expected to last <3days Grooming: Independent with device (comment) Feeding: Independent Bathing: Needs assistance Is this a change from baseline?: Pre-admission baseline Toileting: Needs assistance Is this a change from baseline?: Change from  baseline, expected to last <3 days In/Out Bed: Needs assistance Is this a change from baseline?: Pre-admission baseline Walks in Home: Independent with device (comment) (wheelchair dependent) Does the patient have difficulty walking or climbing stairs?: Yes Weakness of Legs: Both Weakness of Arms/Hands: Both  Permission Sought/Granted Permission sought to share information with : Family Supports          Permission granted to share info w Relationship: Sons - Freida Busman and Loraine Leriche who are [facility] POAs     Emotional Assessment Appearance:: Developmentally appropriate     Orientation: : Oriented to Self Alcohol / Substance Use: Not Applicable Psych Involvement: No (comment)  Admission diagnosis:  Diverticulitis [K57.92] Atrial fibrillation with RVR (HCC) [I48.91] Pneumonia of both lower lobes due to infectious organism [J18.9] Community acquired pneumonia, unspecified laterality [J18.9] Sepsis, due to unspecified organism, unspecified whether acute organ dysfunction present [facility]) [A41.9] Patient Active Problem List   Diagnosis Date Noted   Sepsis (HCC) 05/19/2022   Diverticulitis    Pneumonia of both lower lobes due to infectious organism 05/18/2022   UTI (urinary tract infection) 05/18/2022   Hypokalemia 05/18/2022   Acute encephalopathy 05/18/2022   Persistent atrial fibrillation (HCC) 09/23/2021   Secondary hypercoagulable state (HCC) 09/23/2021   Sick sinus syndrome (HCC)    Decreased cardiac ejection fraction 07/03/2012   AKI (acute kidney injury) (HCC) 05/16/2012   Atrial fibrillation with rapid ventricular response (HCC) 05/15/2012   Shoulder pain, bilateral 05/15/2012   PCP:  Patient, No Pcp Per Pharmacy:   [facility] [place], [place] - [address] [place] [address] Phone: [phone] Fax: [phone]     Social Determinants of Health (SDOH) Interventions    Readmission Risk Interventions     No data to display

## 2022-05-22 NOTE — TOC Progression Note (Signed)
Transition of Care [facility]) - Progression Note    Patient Details  Name: Justin Baker MRN: [id number] Date of Birth: 02-Sep-1921  Transition of Care [facility]) CM/SW Contact  Kimber Relic, LCSW Phone Number: 05/22/2022, 3:40 PM  Clinical Narrative:    FL2 complete and Pt has been faxed to [facility] - which is the preferred facility due to the pt being there previously. Please speak with [name] and/or Valinda Party whom are the pt's [facility] POAs. This CSW left a voicemail for Soy, admissions rep at [employer] explaining the situation. Please follow up with Soy on Monday. Pt will need Ins Auth if accepted to SNF. Per [name], the family is prepared to private pay if the Ins Josem Kaufmann is denied. If for some reason the pt can not be placed directly to [facility], the pt will need to return home with (potentially) HH Oxygen. Pt was on Nasal Cannula early part of day today (9/30) and is currently on Room Air. Please refer to flowsheet when reviewed again.  TOC following.   Expected Discharge Plan: [place] [facility] will attempt to have pt placed for SNF, if not, pt will return home w/HH.) Barriers to Discharge: Other (must enter comment) (PT has seen pt and is rec HHPT; however, family is stating the pt is total care and they are unable to provide the care te pt needs in the home.)  Expected Discharge Plan and Services Expected Discharge Plan: [place] [facility] will attempt to have pt placed for SNF, if not, pt will return home w/HH.) In-house Referral: Clinical Social Work, Hospice / [place] Discharge Planning Services: CM Consult Post Acute Care Choice: [place] arrangements for the past 2 months: Single Family Home                                       Social Determinants of Health (SDOH) Interventions    Readmission Risk Interventions     No data to display

## 2022-05-22 NOTE — NC FL2 (Addendum)
[place] LEVEL OF CARE SCREENING TOOL     IDENTIFICATION  Patient Name: Justin Baker Birthdate: [birth year] Sex: male Admission Date (Current Location): 05/18/2022  [facility] and [place] Number:  Herbalist and Address:  [facility],  [address] [place], [place]      Provider Number: [id number]  Attending Physician Name and Address:  Allie Bossier, MD  Relative Name and Phone Number:  [name] (son/HCPOA) -[phone]    Current Level of Care: Hospital Recommended Level of Care: [place] Prior Approval Number:    Date Approved/Denied:   PASRR Number: [id number]  Discharge Plan: SNF    Current Diagnoses: Patient Active Problem List   Diagnosis Date Noted   Sepsis ([place]) 05/19/2022   Diverticulitis    Pneumonia of both lower lobes due to infectious organism 05/18/2022   UTI (urinary tract infection) 05/18/2022   Hypokalemia 05/18/2022   Acute encephalopathy 05/18/2022   Persistent atrial fibrillation ([place]) 09/23/2021   Secondary hypercoagulable state ([place]) 09/23/2021   Sick sinus syndrome (HCC)    Decreased cardiac ejection fraction 07/03/2012   AKI (acute kidney injury) ([place]) 05/16/2012   Atrial fibrillation with rapid ventricular response ([place]) 05/15/2012   Shoulder pain, bilateral 05/15/2012    Orientation RESPIRATION BLADDER Height & Weight     Self  Normal - Nasal Cannula 2L Incontinent Weight: 190 lb 11.2 oz (86.5 kg) Height:  5\' 9"  (175.3 cm)  BEHAVIORAL SYMPTOMS/MOOD NEUROLOGICAL BOWEL NUTRITION STATUS      Incontinent Diet (Regular)  AMBULATORY STATUS COMMUNICATION OF NEEDS Skin   Limited Assist Verbally Normal                       Personal Care Assistance Level of Assistance  Bathing, Feeding, Dressing Bathing Assistance: Limited assistance Feeding assistance: Limited assistance Dressing Assistance: Limited assistance     Functional Limitations Info  Sight, Hearing, Speech Sight  Info: Adequate Hearing Info: Adequate Speech Info: Adequate    SPECIAL CARE FACTORS FREQUENCY  PT (By licensed PT), OT (By licensed OT)     PT Frequency: x5/week OT Frequency: x5/week            Contractures Contractures Info: Not present    Additional Factors Info  Code Status, Allergies, Psychotropic Code Status Info: DNR Allergies Info: None Psychotropic Info: None         Current Medications (05/22/2022):  This is the current hospital active medication list Current Facility-Administered Medications  Medication Dose Route Frequency Provider Last Rate Last Admin   acetaminophen (TYLENOL) tablet 650 mg  650 mg Oral Q6H PRN Marcelyn Bruins, MD       Or   acetaminophen (TYLENOL) suppository 650 mg  650 mg Rectal Q6H PRN Marcelyn Bruins, MD       atorvastatin (LIPITOR) tablet 40 mg  40 mg Oral Daily Marcelyn Bruins, MD   40 mg at 05/22/22 0947   ciprofloxacin (CILOXAN) 0.3 % ophthalmic solution 2 drop  2 drop Both Eyes Q4H while awake Marcelyn Bruins, MD   2 drop at 05/22/22 0947   feeding supplement (ENSURE ENLIVE / ENSURE PLUS) liquid 237 mL  237 mL Oral BID BM Allie Bossier, MD       guaiFENesin-dextromethorphan (ROBITUSSIN DM) 100-10 MG/5ML syrup 5 mL  5 mL Oral Q4H PRN Marcelyn Bruins, MD       ipratropium-albuterol (DUONEB) 0.5-2.5 (3) MG/3ML nebulizer solution 3 mL  3 mL Nebulization  TID Drema Dallas, MD   3 mL at 05/22/22 1448   levalbuterol (XOPENEX) nebulizer solution 0.63 mg  0.63 mg Nebulization Q6H PRN Anthoney Harada, NP   0.63 mg at 05/21/22 0625   metoprolol tartrate (LOPRESSOR) tablet 75 mg  75 mg Oral BID Synetta Fail, MD   75 mg at 05/22/22 0947   metroNIDAZOLE (FLAGYL) IVPB 500 mg  500 mg Intravenous Q12H Synetta Fail, MD 100 mL/hr at 05/22/22 0954 500 mg at 05/22/22 0954   Oral care mouth rinse  15 mL Mouth Rinse PRN Synetta Fail, MD       polyethylene glycol (MIRALAX / GLYCOLAX) packet 17 g  17 g Oral Daily PRN  Synetta Fail, MD       sodium chloride flush (NS) 0.9 % injection 3 mL  3 mL Intravenous Q12H Synetta Fail, MD   3 mL at 05/20/22 2220   sodium chloride HYPERTONIC 3 % nebulizer solution 4 mL  4 mL Nebulization BID Drema Dallas, MD   4 mL at 05/22/22 2355     Discharge Medications: Please see discharge summary for a list of discharge medications.  Relevant Imaging Results:  Relevant Lab Results:   Additional Information SSN: [id number]  Princella Ion, LCSW

## 2022-05-23 DIAGNOSIS — I4891 Unspecified atrial fibrillation: Secondary | ICD-10-CM | POA: Diagnosis not present

## 2022-05-23 DIAGNOSIS — G9340 Encephalopathy, unspecified: Secondary | ICD-10-CM | POA: Diagnosis not present

## 2022-05-23 DIAGNOSIS — N179 Acute kidney failure, unspecified: Secondary | ICD-10-CM | POA: Diagnosis not present

## 2022-05-23 DIAGNOSIS — A419 Sepsis, unspecified organism: Secondary | ICD-10-CM | POA: Diagnosis not present

## 2022-05-23 LAB — CBC WITH DIFFERENTIAL/PLATELET
Abs Immature Granulocytes: 0.22 10*3/uL — ABNORMAL HIGH (ref 0.00–0.07)
Basophils Absolute: 0.1 10*3/uL (ref 0.0–0.1)
Basophils Relative: 1 %
Eosinophils Absolute: 0.2 10*3/uL (ref 0.0–0.5)
Eosinophils Relative: 2 %
HCT: 41.5 % (ref 39.0–52.0)
Hemoglobin: 12.6 g/dL — ABNORMAL LOW (ref 13.0–17.0)
Immature Granulocytes: 2 %
Lymphocytes Relative: 17 %
Lymphs Abs: 1.9 10*3/uL (ref 0.7–4.0)
MCH: 33 pg (ref 26.0–34.0)
MCHC: 30.4 g/dL (ref 30.0–36.0)
MCV: 108.6 fL — ABNORMAL HIGH (ref 80.0–100.0)
Monocytes Absolute: 0.6 10*3/uL (ref 0.1–1.0)
Monocytes Relative: 5 %
Neutro Abs: 8.3 10*3/uL — ABNORMAL HIGH (ref 1.7–7.7)
Neutrophils Relative %: 73 %
Platelets: 210 10*3/uL (ref 150–400)
RBC: 3.82 MIL/uL — ABNORMAL LOW (ref 4.22–5.81)
RDW: 13.7 % (ref 11.5–15.5)
WBC: 11.2 10*3/uL — ABNORMAL HIGH (ref 4.0–10.5)
nRBC: 0 % (ref 0.0–0.2)

## 2022-05-23 LAB — CULTURE, BLOOD (ROUTINE X 2)
Culture: NO GROWTH
Culture: NO GROWTH
Special Requests: ADEQUATE

## 2022-05-23 LAB — COMPREHENSIVE METABOLIC PANEL
ALT: 23 U/L (ref 0–44)
AST: 21 U/L (ref 15–41)
Albumin: 2.8 g/dL — ABNORMAL LOW (ref 3.5–5.0)
Alkaline Phosphatase: 50 U/L (ref 38–126)
Anion gap: 7 (ref 5–15)
BUN: 27 mg/dL — ABNORMAL HIGH (ref 8–23)
CO2: 25 mmol/L (ref 22–32)
Calcium: 8.4 mg/dL — ABNORMAL LOW (ref 8.9–10.3)
Chloride: 112 mmol/L — ABNORMAL HIGH (ref 98–111)
Creatinine, Ser: 1.48 mg/dL — ABNORMAL HIGH (ref 0.61–1.24)
GFR, Estimated: 42 mL/min — ABNORMAL LOW (ref >60)
Glucose, Bld: 118 mg/dL — ABNORMAL HIGH (ref 70–99)
Potassium: 3.7 mmol/L (ref 3.5–5.1)
Sodium: 144 mmol/L (ref 135–145)
Total Bilirubin: 0.8 mg/dL (ref 0.3–1.2)
Total Protein: 5.7 g/dL — ABNORMAL LOW (ref 6.5–8.1)

## 2022-05-23 LAB — MAGNESIUM: Magnesium: 1.9 mg/dL (ref 1.7–2.4)

## 2022-05-23 LAB — PHOSPHORUS: Phosphorus: 4 mg/dL (ref 2.5–4.6)

## 2022-05-23 NOTE — Progress Notes (Signed)
Justin Baker HAF:[id number] DOB: 01-Jul-1922 DOA: 05/18/2022 PCP: Patient, No Pcp Per   Subj:  88 y.o. WM PMHx Atrial fibrillation, SSS (s/p pacemaker), decreased EF,   pain presenting with altered mental status and multiple other complaints. Per family and chart review patient has productive cough for about 2 weeks with greenish to brown sputum production.  Intermittent chills but no reported fever.  Poor p.o. intake and decreased appetite.  He started becoming short of breath and complains of palpitations.  Also noted to have dysuria and increased urinary frequency for the past several days. Gradually worsening confusion and increased somnolence which is unusual for him.   ED course.  Afebrile with tachycardia and low 100s.  Hypoxic requiring up to 2 L of oxygen.  Labs pertinent for leukocytosis at 14.1, mild transaminitis, mildly elevated creatinine at 1.98 with baseline around 1.5.  Lactic acid normal.  BNP mildly elevated at 479.  Troponin 41>>37.  Influenza and COVID PCR negative. UA positive for hemoglobin, protein, small leukocytes and rare bacteria.  UDS negative. CXR with mild atelectasis versus scarring at bases. CT head was negative for any acute abnormality CTA was negative for PE but did show lower lobe infiltrates consistent with pneumonia as well as bronchial wall thickening and secretions in the trachea which could indicate aspiration. Also noted to have changes consistent with mild diverticulitis and bladder wall thickening consistent with cystitis.   9/27: Preliminary blood cultures negative.  Urine cultures were ordered but unfortunately lab did not had the sample, they were asking for new order entry new sample but patient is already on antibiotics.  UA with very few leukocytes which should be covered with current antibiotic for concern of colitis. Procalcitonin negative, most likely no pneumonia. Clinically feels improving as there was no belly pain today and coughing has been  improved. Ordered PT/OT evaluation.   Patient is very high risk for mortality based on age and underlying comorbidities.  Palliative care was also consulted.   Obj: 10/1A/O x4 states feels fine    Objective: VITAL SIGNS: Temp: 97.5 F (36.4 C) (10/01 0507) BP: 166/70 (10/01 0510) Pulse Rate: 68 (10/01 0510) SPO2; 97% FIO2: 2 L/min   Intake/Output Summary (Last 24 hours) at 05/23/2022 1257 Last data filed at 05/23/2022 1000 Gross per 24 hour  Intake 960 ml  Output 850 ml  Net 110 ml      Exam: General: A/O x4 No acute respiratory distress Lungs: tachypneic decreased breath sounds, positive rhonchorous bilaterally without wheezes or crackles Cardiovascular: Regular rate and rhythm without murmur gallop or rub normal S1 and S2 Abdomen: Nontender, nondistended, soft, bowel sounds positive, no rebound, no ascites, no appreciable mass Extremities: No significant cyanosis, clubbing, or edema bilateral lower extremities Skin: Negative rashes, lesions, ulcers Psychiatric:  Negative depression, negative anxiety, negative fatigue, negative mania  Central nervous system:  Cranial nerves II through XII intact, tongue/uvula midline, all extremities muscle strength 5/5, sensation intact throughout, finger nose finger bilateral within normal limits, quick finger touch bilateral within normal limits, negative dysarthria, negative expressive aphasia, negative receptive aphasia.  .    Mobility Assessment (last 72 hours)     Mobility Assessment     Row Name 05/23/22 1035 05/23/22 0900 05/22/22 2000 05/22/22 1000 05/21/22 2000   Does patient have an order for bedrest or is patient medically unstable No - Continue assessment No - Continue assessment No - Continue assessment No - Continue assessment No - Continue assessment   What is the highest level of  mobility based on the progressive mobility assessment? -- Level 3 (Stands with assist) - Balance while standing  and cannot march in place  Level 3 (Stands with assist) - Balance while standing  and cannot march in place Level 3 (Stands with assist) - Balance while standing  and cannot march in place Level 3 (Stands with assist) - Balance while standing  and cannot march in place   Is the above level different from baseline mobility prior to current illness? -- Yes - Recommend PT order Yes - Recommend PT order Yes - Recommend PT order Yes - Recommend PT order    Row Name 05/21/22 1037 05/21/22 0830 05/20/22 1741 05/20/22 1445     Does patient have an order for bedrest or is patient medically unstable -- No - Continue assessment -- --    What is the highest level of mobility based on the progressive mobility assessment? Level 3 (Stands with assist) - Balance while standing  and cannot march in place Level 3 (Stands with assist) - Balance while standing  and cannot march in place Level 3 (Stands with assist) - Balance while standing  and cannot march in place Level 3 (Stands with assist) - Balance while standing  and cannot march in place    Is the above level different from baseline mobility prior to current illness? -- Yes - Recommend PT order -- --               DVT prophylaxis: Lovenox Code Status: DNR Family Communication: 910/1 spoke at length with [name] (son) discussed plan of care answered all questions. Status is: Inpatient    Dispo: The patient is from:               Anticipated d/c is to: [facility] attempting to have patient placed [facility]              Anticipated d/c date is: 10/2              Patient currently medically stable    Procedures/Significant Events:    Consultants:  Palliative care   Cultures   Antimicrobials: Anti-infectives (From admission, onward)    Start     Ordered Stop   05/18/22 1900  metroNIDAZOLE (FLAGYL) IVPB 500 mg        05/18/22 1837     05/18/22 0400  metroNIDAZOLE (FLAGYL) IVPB 500 mg        05/18/22 0345 05/18/22 0645   05/18/22 0315  cefTRIAXone (ROCEPHIN) 2 g  in sodium chloride 0.9 % 100 mL IVPB        05/18/22 0302 05/22/22 1625   05/18/22 0315  azithromycin (ZITHROMAX) 500 mg in sodium chloride 0.9 % 250 mL IVPB        05/18/22 0302 05/22/22 1625        A/P  Sepsis ([place]) Met sepsis criteria with leukocytosis, tachycardia and tachypnea.  Most likely GI source with concern of diverticulitis.  Preliminary blood cultures negative. There was also concern of UTI with very few leukocytosis on UA-unable to get urine culture as lab lost the sample, no need to send in new sample as patient is already on antibiotic. There was also concern of pneumonia but procalcitonin is negative. -Continue with ceftriaxone and Flagyl. -PT/OT evaluation -Palliative care consult -9/28 Albumin 25 g -9/28 0.45% saline 82m/hr -9/29 DC 0.45% saline   Pneumonia of both lower lobes due to infectious organism/acute respiratory failure with hypoxia CT with concern of bilateral infiltrate and  diffuse bronchial wall thickening with secretions in trachea, concerning for aspiration pneumonia.  Procalcitonin negative. -Continue with ceftriaxone and Flagyl -Swallow evaluation -9/29 flutter valve - 9/29 incentive spirometry - 9/29Hypertonic saline nebulizer BID -9/29 DuoNeb QID SATURATION QUALIFICATIONS: (This note is used to comply with regulatory documentation for home oxygen) Patient Saturations on Room Air at Rest = 92% Patient Saturations on Room Air while Ambulating 2 feet= 86% Patient Saturations on 2 Liters of oxygen while Ambulating = 90% Please briefly explain why patient needs home oxygen:  Pt required supplemental oxygen with activity to achieve therapeutic levels.   -Patient meets requirement for home O2 - 2 L O2 titrate to maintain SPO2> 92% - Provide Inogen home O2 concentrator   UTI (urinary tract infection) Concern of cystitis with diffuse bladder wall thickening on CT abdomen.  UA positive for only few leukocytes.  Urine culture was ordered but  unfortunately sample got lost. -No need to send in new sample as patient is already on antibiotics. -Continue to monitor   AKI (acute kidney injury) ([place]) Most likely secondary to poor p.o. intake and dehydration.  Creatinine improving with IV hydration, now close to baseline of 1.4-1.5. -Avoid nephrotoxins Lab Results  Component Value Date   CREATININE 1.48 (H) 05/23/2022   CREATININE 1.48 (H) 05/21/2022   CREATININE 1.51 (H) 05/20/2022   CREATININE 1.52 (H) 05/19/2022   CREATININE 1.98 (H) 05/18/2022  -9/29 at baseline   Acute encephalopathy -At baseline per son [name]    Atrial fibrillation with rapid ventricular response (HCC) -RVR in ED which improved with IV fluid and home metoprolol. -9/29 NSR. -Continue with home metoprolol -Not on any anticoagulation at home based on age and risk of fall.  Also has an history of subdural hematoma in 2021.   Decreased cardiac ejection fraction Recent echocardiogram recorded in 2017 with normal EF and indeterminate diastolic function Currently appears euvolemic. Home Lasix is being held due to AKI and concern of sepsis. Uses Lasix as needed. -Continue to monitor   Sick sinus syndrome [facility]) S/p pacemaker in place   Hypokalemia Resolved Magnesium was normal. -Continue to monitor-replete as needed   Goals of care - 9/29 Palliative Care Consult: Patient understands that he is most likely aspirating discuss change of status to comfort care.  Allowing patient to live out remainder of life with quality. -9/29 son [name]) would like patient to attend rehab prior to returning home if he qualifies. -9/29 [facility] consult: Request to have patient discharged to SNF     Care during the described time interval was provided by me .  I have reviewed this patient's available data, including medical history, events of note, physical examination, and all test results as part of my evaluation.

## 2022-05-24 DIAGNOSIS — A419 Sepsis, unspecified organism: Secondary | ICD-10-CM | POA: Diagnosis not present

## 2022-05-24 DIAGNOSIS — N179 Acute kidney failure, unspecified: Secondary | ICD-10-CM | POA: Diagnosis not present

## 2022-05-24 DIAGNOSIS — G9340 Encephalopathy, unspecified: Secondary | ICD-10-CM | POA: Diagnosis not present

## 2022-05-24 DIAGNOSIS — I4891 Unspecified atrial fibrillation: Secondary | ICD-10-CM | POA: Diagnosis not present

## 2022-05-24 LAB — COMPREHENSIVE METABOLIC PANEL
ALT: 21 U/L (ref 0–44)
AST: 20 U/L (ref 15–41)
Albumin: 2.6 g/dL — ABNORMAL LOW (ref 3.5–5.0)
Alkaline Phosphatase: 50 U/L (ref 38–126)
Anion gap: 3 — ABNORMAL LOW (ref 5–15)
BUN: 22 mg/dL (ref 8–23)
CO2: 27 mmol/L (ref 22–32)
Calcium: 8.2 mg/dL — ABNORMAL LOW (ref 8.9–10.3)
Chloride: 111 mmol/L (ref 98–111)
Creatinine, Ser: 1.38 mg/dL — ABNORMAL HIGH (ref 0.61–1.24)
GFR, Estimated: 46 mL/min — ABNORMAL LOW (ref >60)
Glucose, Bld: 101 mg/dL — ABNORMAL HIGH (ref 70–99)
Potassium: 3.9 mmol/L (ref 3.5–5.1)
Sodium: 141 mmol/L (ref 135–145)
Total Bilirubin: 0.7 mg/dL (ref 0.3–1.2)
Total Protein: 5.6 g/dL — ABNORMAL LOW (ref 6.5–8.1)

## 2022-05-24 LAB — CBC WITH DIFFERENTIAL/PLATELET
Abs Immature Granulocytes: 0.17 10*3/uL — ABNORMAL HIGH (ref 0.00–0.07)
Basophils Absolute: 0.1 10*3/uL (ref 0.0–0.1)
Basophils Relative: 1 %
Eosinophils Absolute: 0.2 10*3/uL (ref 0.0–0.5)
Eosinophils Relative: 2 %
HCT: 39.8 % (ref 39.0–52.0)
Hemoglobin: 12.4 g/dL — ABNORMAL LOW (ref 13.0–17.0)
Immature Granulocytes: 2 %
Lymphocytes Relative: 17 %
Lymphs Abs: 1.9 10*3/uL (ref 0.7–4.0)
MCH: 33.2 pg (ref 26.0–34.0)
MCHC: 31.2 g/dL (ref 30.0–36.0)
MCV: 106.4 fL — ABNORMAL HIGH (ref 80.0–100.0)
Monocytes Absolute: 0.6 10*3/uL (ref 0.1–1.0)
Monocytes Relative: 5 %
Neutro Abs: 8 10*3/uL — ABNORMAL HIGH (ref 1.7–7.7)
Neutrophils Relative %: 73 %
Platelets: 197 10*3/uL (ref 150–400)
RBC: 3.74 MIL/uL — ABNORMAL LOW (ref 4.22–5.81)
RDW: 13.9 % (ref 11.5–15.5)
WBC: 10.9 10*3/uL — ABNORMAL HIGH (ref 4.0–10.5)
nRBC: 0 % (ref 0.0–0.2)

## 2022-05-24 LAB — PHOSPHORUS: Phosphorus: 3.6 mg/dL (ref 2.5–4.6)

## 2022-05-24 LAB — MAGNESIUM: Magnesium: 1.9 mg/dL (ref 1.7–2.4)

## 2022-05-24 NOTE — TOC Progression Note (Addendum)
Transition of Care [facility]) - Progression Note    Patient Details  Name: Justin Baker MRN: [id number] Date of Birth: [birth year]  Transition of Care [facility]) CM/SW [place], RN Phone Number: 05/24/2022, 12:24 PM  Clinical Narrative:   Placed call to Justin Baker with Justin Baker to get status of her review for SNF placement, awaiting response.  TOC will continue to follow.  -1:00pm Called POA Justin Baker phone, wife Justin Baker answered with Justin Baker present. This RNCM advised Justin Baker has made a bed offer to patient. Justin Baker reports they wanted additional bed offers, unsure what POA Justin Baker has suggested. Family Justin Baker) are in agreement with patient going to [facility] as long as it's a private room. This RNCM confirmed with Justin Baker at [facility] has a private room available. Insurance auth initiated, awaiting response, pending.  TOC will continue to follow.     Expected Discharge Plan: [facility] will attempt to have pt placed for SNF, if not, pt will return home w/HH.) Barriers to Discharge: Other (must enter comment) (PT has seen pt and is rec HHPT; however, family is stating the pt is total care and they are unable to provide the care te pt needs in the home.)  Expected Discharge Plan and Services Expected Discharge Plan: [facility] will attempt to have pt placed for SNF, if not, pt will return home w/HH.) In-house Referral: Clinical Social Work, Hospice / [place] Discharge Planning Services: CM Consult Post Acute Care Choice: [place] arrangements for the past 2 months: Single Family Home                   DME Agency: NA         Social Determinants of Health (SDOH) Interventions    Readmission Risk Interventions     No data to display

## 2022-05-24 NOTE — Progress Notes (Signed)
Plan is for discharge to skilled nursing facility. Recommend palliative care consultation at SNF to assist with care transition and provide additional support. No further acute palliative care needs. Will sign off, please re-consult if his condition changes or there are additional needs identified.  Lane Hacker, DO Palliative Medicine

## 2022-05-24 NOTE — Care Management Important Message (Signed)
Important Message  Patient Details IM Letter placed in Patient's room. Name: Justin Baker MRN: [id number] Date of Birth: 06-06-1922   Medicare Important Message Given:  Yes     Kerin Salen 05/24/2022, 12:45 PM

## 2022-05-24 NOTE — Progress Notes (Signed)
Fortunato Nordin PYP:[id number] DOB: Nov 21, 1921 DOA: 05/18/2022 PCP: Patient, No Pcp Per   Subj:  88 y.o. WM PMHx Atrial fibrillation, SSS (s/p pacemaker), decreased EF,   pain presenting with altered mental status and multiple other complaints. Per family and chart review patient has productive cough for about 2 weeks with greenish to brown sputum production.  Intermittent chills but no reported fever.  Poor p.o. intake and decreased appetite.  He started becoming short of breath and complains of palpitations.  Also noted to have dysuria and increased urinary frequency for the past several days. Gradually worsening confusion and increased somnolence which is unusual for him.   ED course.  Afebrile with tachycardia and low 100s.  Hypoxic requiring up to 2 L of oxygen.  Labs pertinent for leukocytosis at 14.1, mild transaminitis, mildly elevated creatinine at 1.98 with baseline around 1.5.  Lactic acid normal.  BNP mildly elevated at 479.  Troponin 41>>37.  Influenza and COVID PCR negative. UA positive for hemoglobin, protein, small leukocytes and rare bacteria.  UDS negative. CXR with mild atelectasis versus scarring at bases. CT head was negative for any acute abnormality CTA was negative for PE but did show lower lobe infiltrates consistent with pneumonia as well as bronchial wall thickening and secretions in the trachea which could indicate aspiration. Also noted to have changes consistent with mild diverticulitis and bladder wall thickening consistent with cystitis.   9/27: Preliminary blood cultures negative.  Urine cultures were ordered but unfortunately lab did not had the sample, they were asking for new order entry new sample but patient is already on antibiotics.  UA with very few leukocytes which should be covered with current antibiotic for concern of colitis. Procalcitonin negative, most likely no pneumonia. Clinically feels improving as there was no belly pain today and coughing has been  improved. Ordered PT/OT evaluation.   Patient is very high risk for mortality based on age and underlying comorbidities.  Palliative care was also consulted.   Obj: 10/2 afebrile overnight A/O x4.  Patient states he feels fine, wants to know when he is going to be discharged.    Objective: VITAL SIGNS: Temp: 97.9 F (36.6 C) (10/02 0511) Temp Source: Oral (10/02 0511) BP: 159/87 (10/02 1018) Pulse Rate: 70 (10/02 1018) SPO2; 94% FIO2: 2 L/min Rate 18   Intake/Output Summary (Last 24 hours) at 05/24/2022 1139 Last data filed at 05/24/2022 0707 Gross per 24 hour  Intake 620.06 ml  Output 450 ml  Net 170.06 ml      Exam: General: A/O x4 No acute respiratory distress Lungs: clear to auscultation bilateral, without wheezes or crackles Cardiovascular: Regular rate and rhythm without murmur gallop or rub normal S1 and S2 Abdomen: Nontender, nondistended, soft, bowel sounds positive, no rebound, no ascites, no appreciable mass Extremities: No significant cyanosis, clubbing, or edema bilateral lower extremities Skin: Negative rashes, lesions, ulcers Psychiatric:  Negative depression, negative anxiety, negative fatigue, negative mania  Central nervous system:  Cranial nerves II through XII intact, tongue/uvula midline, all extremities muscle strength 5/5, sensation intact throughout, finger nose finger bilateral within normal limits, quick finger touch bilateral within normal limits, negative dysarthria, negative expressive aphasia, negative receptive aphasia.  .    Mobility Assessment (last 72 hours)     Mobility Assessment     Row Name 05/24/22 1045 05/24/22 0800 05/23/22 2000 05/23/22 1035 05/23/22 0900   Does patient have an order for bedrest or is patient medically unstable -- No - Continue assessment No - Continue  assessment No - Continue assessment No - Continue assessment   What is the highest level of mobility based on the progressive mobility assessment? Level 3  (Stands with assist) - Balance while standing  and cannot march in place Level 3 (Stands with assist) - Balance while standing  and cannot march in place Level 3 (Stands with assist) - Balance while standing  and cannot march in place -- Level 3 (Stands with assist) - Balance while standing  and cannot march in place   Is the above level different from baseline mobility prior to current illness? -- -- Yes - Recommend PT order -- Yes - Recommend PT order    [place] Name 05/22/22 2000 05/22/22 1000 05/21/22 2000       Does patient have an order for bedrest or is patient medically unstable No - Continue assessment No - Continue assessment No - Continue assessment     What is the highest level of mobility based on the progressive mobility assessment? Level 3 (Stands with assist) - Balance while standing  and cannot march in place Level 3 (Stands with assist) - Balance while standing  and cannot march in place Level 3 (Stands with assist) - Balance while standing  and cannot march in place     Is the above level different from baseline mobility prior to current illness? Yes - Recommend PT order Yes - Recommend PT order Yes - Recommend PT order                DVT prophylaxis: Lovenox Code Status: DNR Family Communication: 10/1 spoke at length with [name] (son) discussed plan of care answered all questions. Status is: Inpatient    Dispo: The patient is from:               Anticipated d/c is to: [facility] attempting to have patient placed [facility]              Anticipated d/c date is: 10/2              Patient currently medically stable    Procedures/Significant Events: 9/26 CT abdomen pelvis W contrast Negative for acute pulmonary embolism. 2. Infiltrates in the lower lungs compatible with pneumonia. Diffuse bronchial wall thickening with secretions in the trachea. Consider aspiration as potential etiology. 3. Possible mild colonic diverticulitis at the descending/sigmoid junction. 4.  Diffuse bladder wall thickening. Correlation with urinalysis is recommended to exclude cystitis. 9/26 CTA PE protocol: Negative PE  Consultants:  Palliative care   Cultures   Antimicrobials: Anti-infectives (From admission, onward)    Start     Ordered Stop   05/18/22 1900  metroNIDAZOLE (FLAGYL) IVPB 500 mg        05/18/22 1837     05/18/22 0400  metroNIDAZOLE (FLAGYL) IVPB 500 mg        05/18/22 0345 05/18/22 0645   05/18/22 0315  cefTRIAXone (ROCEPHIN) 2 g in sodium chloride 0.9 % 100 mL IVPB        05/18/22 0302 05/22/22 1625   05/18/22 0315  azithromycin (ZITHROMAX) 500 mg in sodium chloride 0.9 % 250 mL IVPB        05/18/22 0302 05/22/22 1625          A/P  Sepsis ([place]) Met sepsis criteria with leukocytosis, tachycardia and tachypnea.  Most likely GI source with concern of diverticulitis.  Preliminary blood cultures negative. There was also concern of UTI with very few leukocytosis on UA-unable to get urine culture as  lab lost the sample, no need to send in new sample as patient is already on antibiotic. There was also concern of pneumonia but procalcitonin is negative. -Continue with ceftriaxone and Flagyl. -PT/OT evaluation -Palliative care consult -9/28 Albumin 25 g -9/28 0.45% saline 93ml/hr -9/29 DC 0.45% saline  Diverticulitis - Presumed complete course of antibiotics   Pneumonia of both lower lobes due to infectious organism/acute respiratory failure with hypoxia CT with concern of bilateral infiltrate and diffuse bronchial wall thickening with secretions in trachea, concerning for aspiration pneumonia.  Procalcitonin negative. -Continue with ceftriaxone and Flagyl -Swallow evaluation -9/29 flutter valve - 9/29 incentive spirometry - 9/29Hypertonic saline nebulizer BID -9/29 DuoNeb QID SATURATION QUALIFICATIONS: (This note is used to comply with regulatory documentation for home oxygen) Patient Saturations on Room Air at Rest = 92% Patient Saturations  on Room Air while Ambulating 2 feet= 86% Patient Saturations on 2 Liters of oxygen while Ambulating = 90% Please briefly explain why patient needs home oxygen:  Pt required supplemental oxygen with activity to achieve therapeutic levels.   -Patient meets requirement for home O2 - 2 L O2 titrate to maintain SPO2> 92% - Provide Inogen home O2 concentrator   UTI (urinary tract infection) Concern of cystitis with diffuse bladder wall thickening on CT abdomen.  UA positive for only few leukocytes.  Urine culture was ordered but unfortunately sample got lost. -No need to send in new sample as patient is already on antibiotics. -Continue to monitor   AKI (acute kidney injury) ([place]) Most likely secondary to poor p.o. intake and dehydration.  Creatinine improving with IV hydration, now close to baseline of 1.4-1.5. -Avoid nephrotoxins Lab Results  Component Value Date   CREATININE 1.38 (H) 05/24/2022   CREATININE 1.48 (H) 05/23/2022   CREATININE 1.48 (H) 05/21/2022   CREATININE 1.51 (H) 05/20/2022   CREATININE 1.52 (H) 05/19/2022  -9/29 at baseline   Acute encephalopathy -At baseline per son [name]    Atrial fibrillation with rapid ventricular response (HCC) -RVR in ED which improved with IV fluid and home metoprolol. -9/29 NSR. -Continue with home metoprolol -Not on any anticoagulation at home based on age and risk of fall.  Also has an history of subdural hematoma in 2021.   Decreased cardiac ejection fraction Recent echocardiogram recorded in 2017 with normal EF and indeterminate diastolic function Currently appears euvolemic. Home Lasix is being held due to AKI and concern of sepsis. Uses Lasix as needed. -Continue to monitor   Sick sinus syndrome [facility]) S/p pacemaker in place   Hypokalemia Resolved Magnesium was normal. -Continue to monitor-replete as needed   Goals of care - 9/29 Palliative Care Consult: Patient understands that he is most likely aspirating discuss change  of status to comfort care.  Allowing patient to live out remainder of life with quality. -9/29 son [name]) would like patient to attend rehab prior to returning home if he qualifies. -9/29 [facility] consult: Request to have patient discharged to SNF     Care during the described time interval was provided by me .  I have reviewed this patient's available data, including medical history, events of note, physical examination, and all test results as part of my evaluation.

## 2022-05-24 NOTE — Progress Notes (Signed)
Physical Therapy Treatment Patient Details Name: Justin Baker MRN: [id number] DOB: [birth year] Today's Date: 05/24/2022   History of Present Illness 88 y.o. male with medical history significant of atrial fibrillation, sick sinus syndrome status post pacemaker, decreased EF, COPD, Covid, back pain and admitted for sepsis and pneumonia    PT Comments    Min assist for supine to sit, min A for standing balance due to posterior lean, pt able to take a few pivotal steps with RW from bed to recliner. Ambulation deferred 2* fatigue with pivot to recliner. Pt performed seated BUE/LE exercises for strengthening.     Recommendations for follow up therapy are one component of a multi-disciplinary discharge planning process, led by the attending physician.  Recommendations may be updated based on patient status, additional functional criteria and insurance authorization.  Follow Up Recommendations  Skilled nursing-short term rehab (<3 hours/day) Can patient physically be transported by private vehicle: No   Assistance Recommended at Discharge Intermittent Supervision/Assistance  Patient can return home with the following A little help with walking and/or transfers;A little help with bathing/dressing/bathroom;Help with stairs or ramp for entrance;Assistance with cooking/housework;Assist for transportation;Direct supervision/assist for medications management   Equipment Recommendations  None recommended by PT    Recommendations for Other Services       Precautions / Restrictions Precautions Precautions: Fall Precaution Comments: on 2L O2 [place] currently Restrictions Weight Bearing Restrictions: No     Mobility  Bed Mobility Overal bed mobility: Needs Assistance Bed Mobility: Supine to Sit     Supine to sit: Min assist, HOB elevated     General bed mobility comments: min A to raise trunk, HOB up, used rail    Transfers Overall transfer level: Needs assistance Equipment used: Rolling  walker (2 wheels) Transfers: Bed to chair/wheelchair/BSC, Sit to/from Stand Sit to Stand: Min assist, From elevated surface   Step pivot transfers: Min guard, From elevated surface       General transfer comment: min A to power up from elevated bed, min A for balance 2* posterior lean in standing, pt took a few pivotal steps to recliner with RW    Ambulation/Gait                   Stairs             Wheelchair Mobility    Modified Rankin (Stroke Patients Only)       Balance Overall balance assessment: Needs assistance Sitting-balance support: No upper extremity supported, Feet supported Sitting balance-Leahy Scale: Good     Standing balance support: During functional activity, Reliant on assistive device for balance Standing balance-Leahy Scale: Poor                              Cognition Arousal/Alertness: Awake/alert Behavior During Therapy: WFL for tasks assessed/performed Overall Cognitive Status: Within Functional Limits for tasks assessed                                 General Comments: AxO x 3 very pleasant but very HOH.  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  Pt stated he plays it smart.  "I use my wheelchair to scoot myself room to room".  Pt knows he is weak and does not want to  risk falling.        Exercises General Exercises - Upper Extremity Shoulder Flexion: AROM, Both, 10 reps, Seated General Exercises - Lower Extremity Ankle Circles/Pumps: AROM, Both, 10 reps, Supine Heel Slides: AROM, Both, 10 reps, Supine    General Comments        Pertinent Vitals/Pain Pain Assessment Pain Assessment: No/denies pain    Home Living                          Prior Function            PT Goals (current goals can now be found in the care plan section) Acute Rehab PT Goals PT Goal Formulation: With  patient Time For Goal Achievement: 06/03/22 Potential to Achieve Goals: Good Progress towards PT goals: Progressing toward goals    Frequency    Min 2X/week      PT Plan Discharge plan needs to be updated    Co-evaluation              AM-PAC PT "6 Clicks" Mobility   Outcome Measure  Help needed turning from your back to your side while in a flat bed without using bedrails?: A Little Help needed moving from lying on your back to sitting on the side of a flat bed without using bedrails?: A Little Help needed moving to and from a bed to a chair (including a wheelchair)?: A Little Help needed standing up from a chair using your arms (e.g., wheelchair or bedside chair)?: A Little Help needed to walk in hospital room?: Total Help needed climbing 3-5 steps with a railing? : Total 6 Click Score: 14    End of Session Equipment Utilized During Treatment: Gait belt Activity Tolerance: Patient limited by fatigue Patient left: in chair;with call bell/phone within reach;with chair alarm set;with nursing/sitter in room Nurse Communication: Mobility status PT Visit Diagnosis: Other abnormalities of gait and mobility (R26.89)     Time: 9242-6834 PT Time Calculation (min) (ACUTE ONLY): 16 min  Charges:  $Therapeutic Activity: 8-22 mins                     Blondell Reveal Kistler PT 05/24/2022  Acute Rehabilitation Services  Office [phone]

## 2022-05-25 DIAGNOSIS — G9340 Encephalopathy, unspecified: Secondary | ICD-10-CM | POA: Diagnosis not present

## 2022-05-25 DIAGNOSIS — A419 Sepsis, unspecified organism: Secondary | ICD-10-CM | POA: Diagnosis not present

## 2022-05-25 DIAGNOSIS — N179 Acute kidney failure, unspecified: Secondary | ICD-10-CM | POA: Diagnosis not present

## 2022-05-25 DIAGNOSIS — J189 Pneumonia, unspecified organism: Secondary | ICD-10-CM | POA: Diagnosis not present

## 2022-05-25 LAB — COMPREHENSIVE METABOLIC PANEL
ALT: 21 U/L (ref 0–44)
AST: 22 U/L (ref 15–41)
Albumin: 2.7 g/dL — ABNORMAL LOW (ref 3.5–5.0)
Alkaline Phosphatase: 50 U/L (ref 38–126)
Anion gap: 5 (ref 5–15)
BUN: 23 mg/dL (ref 8–23)
CO2: 29 mmol/L (ref 22–32)
Calcium: 8.4 mg/dL — ABNORMAL LOW (ref 8.9–10.3)
Chloride: 108 mmol/L (ref 98–111)
Creatinine, Ser: 1.34 mg/dL — ABNORMAL HIGH (ref 0.61–1.24)
GFR, Estimated: 48 mL/min — ABNORMAL LOW (ref >60)
Glucose, Bld: 106 mg/dL — ABNORMAL HIGH (ref 70–99)
Potassium: 4 mmol/L (ref 3.5–5.1)
Sodium: 142 mmol/L (ref 135–145)
Total Bilirubin: 0.7 mg/dL (ref 0.3–1.2)
Total Protein: 5.7 g/dL — ABNORMAL LOW (ref 6.5–8.1)

## 2022-05-25 LAB — CBC WITH DIFFERENTIAL/PLATELET
Abs Immature Granulocytes: 0.14 10*3/uL — ABNORMAL HIGH (ref 0.00–0.07)
Basophils Absolute: 0.1 10*3/uL (ref 0.0–0.1)
Basophils Relative: 1 %
Eosinophils Absolute: 0.2 10*3/uL (ref 0.0–0.5)
Eosinophils Relative: 2 %
HCT: 38.6 % — ABNORMAL LOW (ref 39.0–52.0)
Hemoglobin: 12.3 g/dL — ABNORMAL LOW (ref 13.0–17.0)
Immature Granulocytes: 2 %
Lymphocytes Relative: 20 %
Lymphs Abs: 1.6 10*3/uL (ref 0.7–4.0)
MCH: 33.4 pg (ref 26.0–34.0)
MCHC: 31.9 g/dL (ref 30.0–36.0)
MCV: 104.9 fL — ABNORMAL HIGH (ref 80.0–100.0)
Monocytes Absolute: 0.4 10*3/uL (ref 0.1–1.0)
Monocytes Relative: 5 %
Neutro Abs: 5.8 10*3/uL (ref 1.7–7.7)
Neutrophils Relative %: 70 %
Platelets: 198 10*3/uL (ref 150–400)
RBC: 3.68 MIL/uL — ABNORMAL LOW (ref 4.22–5.81)
RDW: 13.9 % (ref 11.5–15.5)
WBC: 8.2 10*3/uL (ref 4.0–10.5)
nRBC: 0 % (ref 0.0–0.2)

## 2022-05-25 LAB — PHOSPHORUS: Phosphorus: 3.5 mg/dL (ref 2.5–4.6)

## 2022-05-25 LAB — MAGNESIUM: Magnesium: 2 mg/dL (ref 1.7–2.4)

## 2022-05-25 MED ORDER — IPRATROPIUM-ALBUTEROL 0.5-2.5 (3) MG/3ML IN SOLN
3.0000 mL | RESPIRATORY_TRACT | Status: DC | PRN
  Administered 2022-05-25: 3 mL via RESPIRATORY_TRACT

## 2022-05-25 MED ORDER — CIPROFLOXACIN HCL 0.3 % OP SOLN: 2.0000 [drp] | OPHTHALMIC | 0 refills | Status: DC

## 2022-05-25 MED ORDER — IPRATROPIUM-ALBUTEROL 0.5-2.5 (3) MG/3ML IN SOLN
3.0000 mL | Freq: Two times a day (BID) | RESPIRATORY_TRACT | Status: DC
  Administered 2022-05-25: 3 mL via RESPIRATORY_TRACT
  Filled 2022-05-25: qty 3

## 2022-05-25 NOTE — TOC Transition Note (Signed)
Transition of Care [facility]) - CM/SW Discharge Note   Patient Details  Name: Justin Baker MRN: [id number] Date of Birth: 10/02/1921  [facility] Care [facility]) CM/SW Contact:  Roseanne Kaufman, RN Phone Number: 05/25/2022, 12:27 PM   Clinical Narrative:   SNF authorization ID [id number] has been approved, awaiting discharge summary.  MD, RN notified.  Report can be called to [phone], room# 603, once discharge summary is available.  Called patient's son Nassim Cosma, his wife [name] answered. This RNCM advised of auth approval and will await discharge summary to transport patient to [facility].   TOC will continue to follow.    Final next level of care: Skilled Nursing Facility Barriers to Discharge: No Barriers Identified   Patient Goals and CMS Choice Patient states their goals for this hospitalization and ongoing recovery are:: receive rehab at [facility] CMS Medicare.gov Compare Post Acute Care list provided to:: Patient Represenative (must comment) Rolanda Jay)    Discharge Placement  [facility] term SN[facility])            [facility] at[facility] Patient to be transferred to facility by: [facility] Name of family member notified: Janace Aris    Discharge Plan and Services In-house Referral: Clinical Social Work, Hospice / Palliative Care Discharge Planning Services: CM Consult Post Acute Care Choice: [place]          DME Arranged: [name] DME Agency: NA       HH Arranged: NA HH Agency: NA        Social Determinants of Health (SDOH) Interventions     Readmission Risk Interventions     No data to display

## 2022-05-25 NOTE — Plan of Care (Signed)

## 2022-05-25 NOTE — TOC Transition Note (Addendum)
Transition of Care [facility]) - CM/SW Discharge Note   Patient Details  Name: Justin Baker MRN: [id number] Date of Birth: 1922/01/22  Transition of Care [facility]) CM/SW Contact:  Roseanne Kaufman, RN Phone Number: 05/25/2022, 2:33 PM   Clinical Narrative:   Jackquline [place] consult for outpatient palliative services. Offered choice to Family, [facility]), famly chose [facility].  Notified [facility] of outpatient palliative referral. Family, [facility]) are requesting POA paperwork be sent with patient, per Soy at SNF this information is not needed. Family still request info be sent Notified RN that family is requesting call once patient is [name] is transporting patient.  No additional TOC needs at this time.  [facility] has been called.    Final next level of care: Skilled Nursing Facility Barriers to Discharge: No Barriers Identified   Patient Goals and CMS Choice Patient states their goals for this hospitalization and ongoing recovery are:: receive rehab at [facility] CMS Medicare.gov Compare Post Acute Care list provided to:: Patient Represenative (must comment) Rolanda Jay)    Discharge Placement              [facility] at[facility] Patient to be transferred to facility by: [place] Name of family member notified: Janace Aris    Discharge Plan and Services In-house Referral: Clinical Social Work, Hospice / Palliative Care Discharge Planning Services: CM Consult Post Acute Care Choice: [place]          DME Arranged: [name] DME Agency: NA       HH Arranged: NA HH Agency: NA        Social Determinants of Health (SDOH) Interventions     Readmission Risk Interventions     No data to display

## 2022-05-25 NOTE — Discharge Summary (Signed)
Physician Discharge Summary  Justin Baker MRN:[id number] DOB: 12/14/1921 DOA: 05/18/2022  PCP: Patient, No Pcp Per  Admit date: 05/18/2022 Discharge date: 05/25/2022  Time spent: 35 minutes  Recommendations for Outpatient Follow-up: Sepsis (HCC) Met sepsis criteria with leukocytosis, tachycardia and tachypnea.  Most likely GI source with concern of diverticulitis.  Preliminary blood cultures negative. There was also concern of UTI with very few leukocytosis on UA-unable to get urine culture as lab lost the sample, no need to send in new sample as patient is already on antibiotic. There was also concern of pneumonia but procalcitonin is negative. -Continue with ceftriaxone and Flagyl.  Completed course -Palliative care consulted -9/28 Albumin 25 g -9/28 0.45% saline 50ml/hr -9/29 DC 0.45% saline   Diverticulitis - Presumed complete course of antibiotics   Pneumonia of both lower lobes due to infectious organism/acute respiratory failure with hypoxia CT with concern of bilateral infiltrate and diffuse bronchial wall thickening with secretions in trachea, concerning for aspiration pneumonia.  Procalcitonin negative. -Continue with ceftriaxone and Flagyl -Swallow evaluation -9/29 flutter valve - 9/29 incentive spirometry - 9/29Hypertonic saline nebulizer BID -9/29 DuoNeb QID SATURATION QUALIFICATIONS: (This note is used to comply with regulatory documentation for home oxygen) Patient Saturations on Room Air at Rest = 92% Patient Saturations on Room Air while Ambulating 2 feet= 86% Patient Saturations on 2 Liters of oxygen while Ambulating = 90% Please briefly explain why patient needs home oxygen:  Pt required supplemental oxygen with activity to achieve therapeutic levels.   -Patient meets requirement for home O2 - 2 L O2 titrate to maintain SPO2> 92% - Provide Inogen home O2 concentrator   UTI (urinary tract infection) Concern of cystitis with diffuse bladder wall thickening on CT  abdomen.  UA positive for only few leukocytes.  Urine culture was ordered but unfortunately sample got lost. -No need to send in new sample as patient is already on antibiotics. -Continue to monitor   AKI (acute kidney injury) (HCC) Most likely secondary to poor p.o. intake and dehydration.  Creatinine improving with IV hydration, now close to baseline of 1.4-1.5. -Avoid nephrotoxins Lab Results  Component Value Date   CREATININE 1.34 (H) 05/25/2022   CREATININE 1.38 (H) 05/24/2022   CREATININE 1.48 (H) 05/23/2022   CREATININE 1.48 (H) 05/21/2022   CREATININE 1.51 (H) 05/20/2022  -Resolved  Acute encephalopathy -At baseline per son Mark    Atrial fibrillation with rapid ventricular response (HCC) -RVR in ED which improved with IV fluid and home metoprolol. -9/29 NSR. -Continue with home metoprolol -Not on any anticoagulation at home based on age and risk of fall.  Also has an history of subdural hematoma in 2021.   Decreased cardiac ejection fraction Recent echocardiogram recorded in 2017 with normal EF and indeterminate diastolic function Currently appears euvolemic. Home Lasix is being held due to AKI and concern of sepsis. Uses Lasix as needed.   Sick sinus syndrome (HCC) S/p pacemaker in place   Hypokalemia Resolved Magnesium was normal.    Goals of care - 9/29 Palliative Care Consult: Patient understands that he is most likely aspirating discuss change of status to comfort care.  Allowing patient to live out remainder of life with quality. -9/29 son (Mark) would like patient to attend rehab prior to returning home if he qualifies. -9/29 TOC consult: Request to have patient discharged to SNF    Discharge Diagnoses:  Principal Problem:   Sepsis (HCC) Active Problems:   Pneumonia of both lower lobes due to infectious organism     UTI (urinary tract infection)   AKI (acute kidney injury) ([place])   Acute encephalopathy   Atrial fibrillation with rapid ventricular  response (HCC)   Decreased cardiac ejection fraction   Sick sinus syndrome ([place])   Hypokalemia   Diverticulitis   Discharge Condition: Stable  Diet recommendation: Regular  Filed Weights   05/22/22 0330 05/23/22 0500 05/24/22 0500  Weight: 86.5 kg 89.5 kg 90.2 kg    History of present illness:  88 y.o. WM PMHx Atrial fibrillation, SSS (s/p pacemaker), decreased EF,    pain presenting with altered mental status and multiple other complaints. Per family and chart review patient has productive cough for about 2 weeks with greenish to brown sputum production.  Intermittent chills but no reported fever.  Poor p.o. intake and decreased appetite.  He started becoming short of breath and complains of palpitations.  Also noted to have dysuria and increased urinary frequency for the past several days. Gradually worsening confusion and increased somnolence which is unusual for him.   ED course.  Afebrile with tachycardia and low 100s.  Hypoxic requiring up to 2 L of oxygen.  Labs pertinent for leukocytosis at 14.1, mild transaminitis, mildly elevated creatinine at 1.98 with baseline around 1.5.  Lactic acid normal.  BNP mildly elevated at 479.  Troponin 41>>37.  Influenza and COVID PCR negative. UA positive for hemoglobin, protein, small leukocytes and rare bacteria.  UDS negative. CXR with mild atelectasis versus scarring at bases. CT head was negative for any acute abnormality CTA was negative for PE but did show lower lobe infiltrates consistent with pneumonia as well as bronchial wall thickening and secretions in the trachea which could indicate aspiration. Also noted to have changes consistent with mild diverticulitis and bladder wall thickening consistent with cystitis.   9/27: Preliminary blood cultures negative.  Urine cultures were ordered but unfortunately lab did not had the sample, they were asking for new order entry new sample but patient is already on antibiotics.  UA with very few  leukocytes which should be covered with current antibiotic for concern of colitis. Procalcitonin negative, most likely no pneumonia. Clinically feels improving as there was no belly pain today and coughing has been improved. Ordered PT/OT evaluation.   Patient is very high risk for mortality based on age and underlying comorbidities.  Palliative care was also consulted.  Hospital Course:  See above  Procedures: 9/26 CT abdomen pelvis W contrast Negative for acute pulmonary embolism. 2. Infiltrates in the lower lungs compatible with pneumonia. Diffuse bronchial wall thickening with secretions in the trachea. Consider aspiration as potential etiology. 3. Possible mild colonic diverticulitis at the descending/sigmoid junction. 4. Diffuse bladder wall thickening. Correlation with urinalysis is recommended to exclude cystitis. 9/26 CTA PE protocol: Negative PE    Consultations: Palliative care   Cultures  9/26 blood LEFT AC negative 9/26 influenza A/B negative 9/26 SARS coronavirus negative 9/27 urine negative    Antibiotics Anti-infectives (From admission, onward)    Start     Ordered Stop   05/18/22 1900  metroNIDAZOLE (FLAGYL) IVPB 500 mg  Status:  Discontinued        05/18/22 1837 05/25/22 1257   05/18/22 0400  metroNIDAZOLE (FLAGYL) IVPB 500 mg        05/18/22 0345 05/18/22 0645   05/18/22 0315  cefTRIAXone (ROCEPHIN) 2 g in sodium chloride 0.9 % 100 mL IVPB        05/18/22 0302 05/22/22 1625   05/18/22 0315  azithromycin (ZITHROMAX) 500 mg in  sodium chloride 0.9 % 250 mL IVPB        05/18/22 0302 05/22/22 1625         Discharge Exam: Vitals:   05/25/22 0446 05/25/22 0900 05/25/22 0907 05/25/22 1210  BP: (!) 141/67  133/69 130/74  Pulse: 61  64 67  Resp: _0 Temp: 98 F (36.7 C)  98.3 F (36.8 C) 98.1 F (36.7 C)  TempSrc: Oral  Oral Oral  SpO2: 99% 98% 100% 100%  Weight:      Height:        General: A/O x4 No acute respiratory  distress Lungs: clear to auscultation bilateral, without wheezes or crackles Cardiovascular: Regular rate and rhythm without murmur gallop or rub normal S1 and S2  Discharge Instructions   Allergies as of 05/25/2022   No Known Allergies      Medication List     TAKE these medications    atorvastatin 40 MG tablet Commonly known as: LIPITOR TAKE 1 TABLET BY MOUTH DAILY   ciprofloxacin 0.3 % ophthalmic solution Commonly known as: CILOXAN Place 2 drops into both eyes every 4 (four) hours while awake. Administer 1 drop, every 2 hours, while awake, for 2 days. Then 1 drop, every 4 hours, while awake, for the next 5 days.   furosemide 20 MG tablet Commonly known as: Lasix Take 1 tablet by mouth daily for 3 days then only as needed for weight gain/swelling What changed:  how much to take how to take this when to take this additional instructions   metoprolol tartrate 50 MG tablet Commonly known as: LOPRESSOR TAKE ONE TABLET BY MOUTH TWICE DAILY What changed: Another medication with the same name was changed. Make sure you understand how and when to take each.   metoprolol tartrate 25 MG tablet Commonly known as: LOPRESSOR TAKE ONE TABLET BY MOUTH TWICE DAILY (INADDITION TO 50MG TWICE A DAY) What changed: See the new instructions.               Durable Medical Equipment  (From admission, onward)           Start     Ordered   05/22/22 1326  For home use only DME oxygen  Once       Comments: SATURATION QUALIFICATIONS: (This note is used to comply with regulatory documentation for home oxygen) Patient Saturations on Room Air at Rest = 92% Patient Saturations on Room Air while Ambulating 2 feet= 86% Patient Saturations on 2 Liters of oxygen while Ambulating = 90% Please briefly explain why patient needs home oxygen:  Pt required supplemental oxygen with activity to achieve therapeutic levels.   -Patient meets requirement for home O2 - 2 L O2 titrate to maintain  SPO2> 92% - Provide Inogen home O2 concentrator  Question Answer Comment  Length of Need Lifetime   Mode or (Route) Nasal cannula   Liters per Minute 2   Frequency Continuous (stationary and portable oxygen unit needed)   Oxygen conserving device Yes   Oxygen delivery system Gas      05/22/22 1325           No Known Allergies  Contact information for after-discharge care     Destination     [facility] .   Service: Skilled Nursing Contact information: [address] [place] [phone]                      The results  of significant diagnostics from this hospitalization (including imaging, microbiology, ancillary and laboratory) are listed below for reference.    Significant Diagnostic Studies: CT Angio Chest PE W and/or Wo Contrast  Result Date: 05/18/2022 CLINICAL DATA:  Low oxygen saturation PE suspected Acute nonlocalized abdominal pain and diarrhea EXAM: CT ANGIOGRAPHY CHEST CT ABDOMEN AND PELVIS WITH CONTRAST TECHNIQUE: Multidetector CT imaging of the chest was performed using the standard protocol during bolus administration of intravenous contrast. Multiplanar CT image reconstructions and MIPs were obtained to evaluate the vascular anatomy. Multidetector CT imaging of the abdomen and pelvis was performed using the standard protocol during bolus administration of intravenous contrast. RADIATION DOSE REDUCTION: This exam was performed according to the departmental dose-optimization program which includes automated exposure control, adjustment of the mA and/or kV according to patient size and/or use of iterative reconstruction technique. CONTRAST:  80mL OMNIPAQUE IOHEXOL 350 MG/ML SOLN COMPARISON:  CT chest 02/28/2021 FINDINGS: CTA CHEST FINDINGS Cardiovascular: Satisfactory opacification of the pulmonary arteries to the segmental level. No evidence of pulmonary embolism. Normal heart size. No pericardial effusion. Left chest  wall pacemaker. Mediastinum/Nodes: Prominent mediastinal lymph nodes are favored reactive. Thyroid gland and esophagus demonstrate no significant findings. Lungs/Pleura: Diffuse moderate bronchial wall thickening. Patchy infiltrates greatest in the lower lungs. Layering secretions in the trachea. No pleural effusion or pneumothorax. Emphysema in the apices. Musculoskeletal: No chest wall abnormality. No acute osseous findings. Review of the MIP images confirms the above findings. CT ABDOMEN and PELVIS FINDINGS Hepatobiliary: No focal liver abnormality is seen. No gallstones, gallbladder wall thickening, or biliary dilatation. Pancreas: Fatty atrophy.  Otherwise unremarkable. Spleen: Unremarkable. Adrenals/Urinary Tract: Unremarkable adrenal glands. Multiple large simple appearing bilateral renal cysts. No follow-up is required. Large nonobstructing 1.2 cm renal calculus in the lower pole of the right kidney. Diffuse bladder wall thickening. Stomach/Bowel: Unremarkable stomach. Normal caliber large and small bowel. Colonic diverticulosis. Question mild wall thickening about a segment of the distal descending and proximal sigmoid colon. Normal appendix. Vascular/Lymphatic: Aortic atherosclerosis. No enlarged abdominal or pelvic lymph nodes. Reproductive: Unremarkable. Other: No free intraperitoneal fluid or air. Musculoskeletal: Right THA.  No acute osseous abnormality. Review of the MIP images confirms the above findings. IMPRESSION: 1. Negative for acute pulmonary embolism. 2. Infiltrates in the lower lungs compatible with pneumonia. Diffuse bronchial wall thickening with secretions in the trachea. Consider aspiration as potential etiology. 3. Possible mild colonic diverticulitis at the descending/sigmoid junction. 4. Diffuse bladder wall thickening. Correlation with urinalysis is recommended to exclude cystitis. 5.  Aortic Atherosclerosis (ICD10-I70.0). Electronically Signed   By: Tyler  Stutzman M.D.   On:  05/18/2022 02:36   CT ABDOMEN PELVIS W CONTRAST  Result Date: 05/18/2022 CLINICAL DATA:  Low oxygen saturation PE suspected Acute nonlocalized abdominal pain and diarrhea EXAM: CT ANGIOGRAPHY CHEST CT ABDOMEN AND PELVIS WITH CONTRAST TECHNIQUE: Multidetector CT imaging of the chest was performed using the standard protocol during bolus administration of intravenous contrast. Multiplanar CT image reconstructions and MIPs were obtained to evaluate the vascular anatomy. Multidetector CT imaging of the abdomen and pelvis was performed using the standard protocol during bolus administration of intravenous contrast. RADIATION DOSE REDUCTION: This exam was performed according to the departmental dose-optimization program which includes automated exposure control, adjustment of the mA and/or kV according to patient size and/or use of iterative reconstruction technique. CONTRAST:  80mL OMNIPAQUE IOHEXOL 350 MG/ML SOLN COMPARISON:  CT chest 02/28/2021 FINDINGS: CTA CHEST FINDINGS Cardiovascular: Satisfactory opacification of the pulmonary arteries to the segmental level. No evidence of   pulmonary embolism. Normal heart size. No pericardial effusion. Left chest wall pacemaker. Mediastinum/Nodes: Prominent mediastinal lymph nodes are favored reactive. Thyroid gland and esophagus demonstrate no significant findings. Lungs/Pleura: Diffuse moderate bronchial wall thickening. Patchy infiltrates greatest in the lower lungs. Layering secretions in the trachea. No pleural effusion or pneumothorax. Emphysema in the apices. Musculoskeletal: No chest wall abnormality. No acute osseous findings. Review of the MIP images confirms the above findings. CT ABDOMEN and PELVIS FINDINGS Hepatobiliary: No focal liver abnormality is seen. No gallstones, gallbladder wall thickening, or biliary dilatation. Pancreas: Fatty atrophy.  Otherwise unremarkable. Spleen: Unremarkable. Adrenals/Urinary Tract: Unremarkable adrenal glands. Multiple large  simple appearing bilateral renal cysts. No follow-up is required. Large nonobstructing 1.2 cm renal calculus in the lower pole of the right kidney. Diffuse bladder wall thickening. Stomach/Bowel: Unremarkable stomach. Normal caliber large and small bowel. Colonic diverticulosis. Question mild wall thickening about a segment of the distal descending and proximal sigmoid colon. Normal appendix. Vascular/Lymphatic: Aortic atherosclerosis. No enlarged abdominal or pelvic lymph nodes. Reproductive: Unremarkable. Other: No free intraperitoneal fluid or air. Musculoskeletal: Right THA.  No acute osseous abnormality. Review of the MIP images confirms the above findings. IMPRESSION: 1. Negative for acute pulmonary embolism. 2. Infiltrates in the lower lungs compatible with pneumonia. Diffuse bronchial wall thickening with secretions in the trachea. Consider aspiration as potential etiology. 3. Possible mild colonic diverticulitis at the descending/sigmoid junction. 4. Diffuse bladder wall thickening. Correlation with urinalysis is recommended to exclude cystitis. 5.  Aortic Atherosclerosis (ICD10-I70.0). Electronically Signed   By: Tyler  Stutzman M.D.   On: 05/18/2022 02:36   CT Head Wo Contrast  Result Date: 05/18/2022 CLINICAL DATA:  Confusion EXAM: CT HEAD WITHOUT CONTRAST TECHNIQUE: Contiguous axial images were obtained from the base of the skull through the vertex without intravenous contrast. RADIATION DOSE REDUCTION: This exam was performed according to the departmental dose-optimization program which includes automated exposure control, adjustment of the mA and/or kV according to patient size and/or use of iterative reconstruction technique. COMPARISON:  10/29/2019 FINDINGS: Brain: There is atrophy and chronic small vessel disease changes. No acute intracranial abnormality. Specifically, no hemorrhage, hydrocephalus, mass lesion, acute infarction, or significant intracranial injury. Vascular: No hyperdense  vessel or unexpected calcification. Skull: No acute calvarial abnormality. Sinuses/Orbits: No acute findings Other: None IMPRESSION: Atrophy, chronic microvascular disease. No acute intracranial abnormality. Electronically Signed   By: Kevin  Dover M.D.   On: 05/18/2022 02:06   DG Chest Port 1 View  Result Date: 05/18/2022 CLINICAL DATA:  Possible sepsis.  Low O2 sats, AFib. EXAM: PORTABLE CHEST 1 VIEW COMPARISON:  02/28/2021. FINDINGS: The heart size and mediastinal contours are within normal limits. There is atherosclerotic calcification of the aorta. Chronic elevation of the left diaphragm is noted with atelectasis or scarring at the lung bases. No consolidation, effusion, or pneumothorax. A dual lead pacemaker is present over the left chest. No acute osseous abnormality. IMPRESSION: Mild atelectasis or scarring at the lung bases. Electronically Signed   By: Laura  Taylor M.D.   On: 05/18/2022 00:56    Microbiology: Recent Results (from the past 240 hour(s))  Resp Panel by RT-PCR (Flu A&B, Covid)     Status: None   Collection Time: 05/18/22 12:58 AM   Specimen: Nasal Swab  Result Value Ref Range Status   SARS Coronavirus 2 by RT PCR NEGATIVE NEGATIVE Final    Comment: (NOTE) SARS-CoV-2 target nucleic acids are NOT DETECTED.  The SARS-CoV-2 RNA is generally detectable in upper respiratory specimens during the acute phase   of infection. The lowest concentration of SARS-CoV-2 viral copies this assay can detect is 138 copies/mL. A negative result does not preclude SARS-Cov-2 infection and should not be used as the sole basis for treatment or other patient management decisions. A negative result may occur with  improper specimen collection/handling, submission of specimen other than nasopharyngeal swab, presence of viral mutation(s) within the areas targeted by this assay, and inadequate number of viral copies(<138 copies/mL). A negative result must be combined with clinical observations,  patient history, and epidemiological information. The expected result is Negative.  Fact Sheet for Patients:  EntrepreneurPulse.com.au  Fact Sheet for Healthcare Providers:  IncredibleEmployment.be  This test is no t yet approved or cleared by the [place] FDA and  has been authorized for detection and/or diagnosis of SARS-CoV-2 by FDA under an Emergency Use Authorization (EUA). This EUA will remain  in effect (meaning this test can be used) for the duration of the COVID-19 declaration under Section 564(b)(1) of the Act, 21 U.S.C.section 360bbb-3(b)(1), unless the authorization is terminated  or revoked sooner.       Influenza A by PCR NEGATIVE NEGATIVE Final   Influenza B by PCR NEGATIVE NEGATIVE Final    Comment: (NOTE) The Xpert Xpress SARS-CoV-2/FLU/RSV plus assay is intended as an aid in the diagnosis of influenza from Nasopharyngeal swab specimens and should not be used as a sole basis for treatment. Nasal washings and aspirates are unacceptable for Xpert Xpress SARS-CoV-2/FLU/RSV testing.  Fact Sheet for Patients: EntrepreneurPulse.com.au  Fact Sheet for Healthcare Providers: IncredibleEmployment.be  This test is not yet approved or cleared by the [place] FDA and has been authorized for detection and/or diagnosis of SARS-CoV-2 by FDA under an Emergency Use Authorization (EUA). This EUA will remain in effect (meaning this test can be used) for the duration of the COVID-19 declaration under Section 564(b)(1) of the Act, 21 U.S.C. section 360bbb-3(b)(1), unless the authorization is terminated or revoked.  Performed at [facility], [address] [place], [place] [address]   Blood Culture (routine x 2)     Status: None   Collection Time: 05/18/22  1:00 AM   Specimen: BLOOD  Result Value Ref Range Status   Specimen Description   Final    BLOOD RIGHT ANTECUBITAL Performed  at [facility], [address] [place], [place] [address]    Special Requests   Final    BOTTLES DRAWN AEROBIC AND ANAEROBIC Blood Culture results may not be optimal due to an excessive volume of blood received in culture bottles Performed at [facility], [address] [place], [place] [address]    Culture   Final    NO GROWTH 5 DAYS Performed at [facility], [place] [address] [place], [place] [address]    Report Status 05/23/2022 FINAL  Final  Blood Culture (routine x 2)     Status: None   Collection Time: 05/18/22  1:00 AM   Specimen: BLOOD  Result Value Ref Range Status   Specimen Description   Final    BLOOD LEFT ANTECUBITAL Performed at [facility], [address] [place], [place] [address]    Special Requests   Final    BOTTLES DRAWN AEROBIC AND ANAEROBIC Blood Culture adequate volume Performed at [facility], [address] [place], [place] [address]    Culture   Final    NO GROWTH 5 DAYS Performed at [facility], [address], [place], [place] [address]    Report Status 05/23/2022 FINAL  Final  Urine Culture     Status: None   Collection Time: 05/19/22  2:28 PM   Specimen: Urine, Clean Catch  Result Value Ref Range Status   Specimen Description   Final    URINE, CLEAN CATCH Performed at [facility], [place] [address], [place], [place] [address]    Special Requests   Final    NONE Performed at [facility], [place] [address], [place], [place] [address]    Culture   Final    NO GROWTH Performed at [facility], [place] [address], [place], [place] [address]    Report Status 05/20/2022 FINAL  Final     Labs: Basic Metabolic Panel: Recent Labs  Lab 05/20/22 0913 05/21/22 2322 05/23/22 0434 05/24/22 0345 05/25/22 0349  NA 144 141 144 141 142  K 4.2 3.6 3.7 3.9 4.0  CL 111 111 112* 111 108  CO2 _0 GLUCOSE 107* 120* 118* 101* 106*  BUN 24*  28* 27* 22 23  CREATININE 1.51* 1.48* 1.48* 1.38* 1.34*  CALCIUM 8.5* 8.3* 8.4* 8.2* 8.4*  MG 1.9 1.9 1.9 1.9 2.0  PHOS  --  3.7 4.0 3.6 3.5   Liver Function Tests: Recent Labs  Lab 05/20/22 0913 05/21/22 2322 05/23/22 0434 05/24/22 0345 05/25/22 0349  AST 34 _1 ALT 38 _2 ALKPHOS 61 55 50 50 50  BILITOT 1.0 0.7 0.8 0.7 0.7  PROT 5.8* 5.6* 5.7* 5.6* 5.7*  ALBUMIN 2.7* 2.8* 2.8* 2.6* 2.7*   No results for input(s): "LIPASE", "AMYLASE" in the last 168 hours. No results for input(s): "AMMONIA" in the last 168 hours. CBC: Recent Labs  Lab 05/20/22 0913 05/21/22 2322 05/23/22 0434 05/24/22 0345 05/25/22 0349  WBC 12.3* 10.8* 11.2* 10.9* 8.2  NEUTROABS 9.5* 8.4* 8.3* 8.0* 5.8  HGB 12.8* 12.2* 12.6* 12.4* 12.3*  HCT 40.0 38.1* 41.5 39.8 38.6*  MCV 104.4* 103.8* 108.6* 106.4* 104.9*  PLT 191 200 210 197 198   Cardiac Enzymes: No results for input(s): "CKTOTAL", "CKMB", "CKMBINDEX", "TROPONINI" in the last 168 hours. BNP: BNP (last 3 results) Recent Labs    05/18/22 0058  BNP 479.2*    ProBNP (last 3 results) No results for input(s): "PROBNP" in the last 8760 hours.  CBG: No results for input(s): "GLUCAP" in the last 168 hours.     Signed:  Dia Crawford, MD [facility]

## 2022-05-26 NOTE — Progress Notes (Signed)
[facility] ([facility]) Hospital Liaison Note  Notified by TOC manager of patient/family request for [facility] palliative services at SNF after discharge.   [facility] hospital liaison will follow patient for discharge disposition.   Please call with any hospice or outpatient palliative care related questions.   Thank you for the opportunity to participate in this patient's care.   Shanita Wicker, LCSW [facility] Hospital Liaison [phone]  

## 2022-06-17 NOTE — Progress Notes (Signed)
Office Visit    Patient Name: Justin Baker Date of Encounter: 06/17/2022  Primary Care Provider:  Patient, No Pcp Per Primary Cardiologist:  [name], MD Primary Electrophysiologist: None  Chief Complaint    Justin Baker is a 88 y.o. male with PMH of PAF, CVA, SSS s/p PPM 2017, subarachnoid hemorrhage who presents today for posthospital follow-up of atrial flutter with RVR due to sepsis.  Past Medical History    Past Medical History:  Diagnosis Date   ARF (acute renal failure) (HCC) 05/16/2012   Atrial fibrillation (HCC)    Back pain    COPD (chronic obstructive pulmonary disease) (HCC)    self diagnosed   COVID 01/21/2021   Osteoarthritis    s/p right total hip   Pain in the chest    Shoulder pain, bilateral    Stroke (HCC)    Subdural hematoma (HCC)    Caused by fall   Tachy-brady syndrome (HCC)    a. s/p STJ dual chamber PPM    Thrombocytopenia (HCC)    Viral respiratory illness 07/09/2014   Past Surgical History:  Procedure Laterality Date   CARDIOVERSION  05/17/2012   Procedure: CARDIOVERSION;  Surgeon: Dolores Patty, MD;  Location: [facility] ENDOSCOPY;  Service: Cardiovascular;  Laterality: [name];   CATARACT EXTRACTION W/ INTRAOCULAR LENS IMPLANT     right   EP IMPLANTABLE DEVICE [name] 11/12/2015   SJM Assurity MRI DR PPM implanted by Dr Graciela Husbands for tachy/brady syndrome   PACEMAKER IMPLANT     TEE WITHOUT CARDIOVERSION  05/17/2012   Procedure: TRANSESOPHAGEAL ECHOCARDIOGRAM (TEE);  Surgeon: Dolores Patty, MD;  Location: [facility] ENDOSCOPY;  Service: Cardiovascular;  Laterality: [name];   TOTAL HIP ARTHROPLASTY     right    Allergies  No Known Allergies  History of Present Illness    Justin Baker  is a 88 year old male with the above mention past medical history who presents today for follow-up of atrial fibrillation with RVR following sepsis due to possible diverticulitis.  [name] was initially seen by Dr. Eden Emms in 2013 for complaint of atrial  fibrillation. TEE was done in hospital to R/O clot showed EF 40-45%, no MV or AV vegetation.  He was treated with amiodarone and started on Eliquis 2.5 mg.  He was felt to be a high bleeding risk due to advanced age and high risk of falls.  His Eliquis was discontinued however in 05/2015 he was admitted to [facility] due to acute CVA due to embolism in the left middle cerebral artery.  He developed sick sinus syndrome and had PPM placed by Dr. Graciela Husbands in 2017.  His amiodarone was discontinued in 2018 and he was admitted 11/2019 due to traumatic frontal lobe sulcus subarachnoid hemorrhage.  He was last seen by Dr. Johney Frame in 07/2021 and patient had declined Watchman at that time with Paceart report showing AF burden of 8%.  He was admitted to the ED on 05/18/2022 due to complaint of productive cough and chills with greenish-brown sputum.  He was afebrile in the ED and became hypoxic requiring 2 L of O2 with chest x-ray showing mild atelectasis.  ABG to experience worsening confusion and increased somnolence.  CTA was negative for PE and CT of the head was negative for acute abnormalities.  He developed AF with RVR which improved with IV fluids and metoprolol.  Justin Baker presents today with his son for post ED follow-up.  Since last being seen in the office patient  reports he has been doing well with no recurrence of tachycardia or dizziness.  He has been compliant with his metoprolol and takes his Lasix as needed for weight gain.  He is currently living at home with his son and reports that completes chair exercises daily.  He continues to have lower extremity edema was advised to use compression stockings and elevate pending.  Patient denies chest pain, palpitations, dyspnea, PND, orthopnea, nausea, vomiting, dizziness, syncope, edema, weight gain, or early satiety.    Home Medications    Current Outpatient Medications  Medication Sig Dispense Refill   atorvastatin (LIPITOR) 40 MG tablet  TAKE 1 TABLET BY MOUTH DAILY (Patient taking differently: Take 40 mg by mouth daily.) 90 tablet 3   ciprofloxacin (CILOXAN) 0.3 % ophthalmic solution Place 2 drops into both eyes every 4 (four) hours while awake. Administer 1 drop, every 2 hours, while awake, for 2 days. Then 1 drop, every 4 hours, while awake, for the next 5 days. 5 mL 0   furosemide (LASIX) 20 MG tablet Take 1 tablet by mouth daily for 3 days then only as needed for weight gain/swelling (Patient taking differently: Take 20 mg by mouth See admin instructions. Take 1 tablet by mouth daily for 3 days then only as needed for weight gain/swelling.) 15 tablet 0   metoprolol tartrate (LOPRESSOR) 25 MG tablet TAKE ONE TABLET BY MOUTH TWICE DAILY (INADDITION TO 50MG  TWICE A DAY) (Patient taking differently: Take 25 mg by mouth 2 (two) times daily. Take in addition to 50 mg twice a day.) 60 tablet 6   metoprolol tartrate (LOPRESSOR) 50 MG tablet TAKE ONE TABLET BY MOUTH TWICE DAILY (Patient taking differently: Take 50 mg by mouth 2 (two) times daily.) 180 tablet 3   No current facility-administered medications for this visit.     Review of Systems  Please see the history of present illness.    (+) Lower extremity edema (+) Leg weakness  All other systems reviewed and are otherwise negative except as noted above.  Physical Exam    Wt Readings from Last 3 Encounters:  05/24/22 198 lb 13.7 oz (90.2 kg)  10/06/21 198 lb 6.4 oz (90 kg)  09/23/21 196 lb 3.2 oz (89 kg)   EY:CXKGY were no vitals filed for this visit.,There is no height or weight on file to calculate BMI.  Constitutional:      Appearance: Healthy appearance. Not in distress.  Neck:     Vascular: JVD normal.  Pulmonary:     Effort: Pulmonary effort is normal.     Breath sounds: No wheezing. No rales. Diminished in the bases Cardiovascular:     Normal rate. Regular rhythm. Normal S1. Normal S2.      Murmurs: There is no murmur.  Edema:    Peripheral edema absent.   Abdominal:     Palpations: Abdomen is soft non tender. There is no hepatomegaly.  Skin:    General: Skin is warm and dry.  Neurological:     General: No focal deficit present.     Mental Status: Alert and oriented to person, place and time.     Cranial Nerves: Cranial nerves are intact.  EKG/LABS/Other Studies Reviewed    ECG personally reviewed by me today -none completed today  Risk Assessment/Calculations:    CHA2DS2-VASc Score = 4   This indicates a 4.8% annual risk of stroke. The patient's score is based upon: CHF History: 0 HTN History: 0 Diabetes History: 0 Stroke History: 2 Vascular Disease  History: 0 Age Score: 2 Gender Score: 0           Lab Results  Component Value Date   WBC 8.2 05/25/2022   HGB 12.3 (L) 05/25/2022   HCT 38.6 (L) 05/25/2022   MCV 104.9 (H) 05/25/2022   PLT 198 05/25/2022   Lab Results  Component Value Date   CREATININE 1.34 (H) 05/25/2022   BUN 23 05/25/2022   NA 142 05/25/2022   K 4.0 05/25/2022   CL 108 05/25/2022   CO2 29 05/25/2022   Lab Results  Component Value Date   ALT 21 05/25/2022   AST 22 05/25/2022   ALKPHOS 50 05/25/2022   BILITOT 0.7 05/25/2022   Lab Results  Component Value Date   CHOL 92 (L) 07/27/2018   HDL 35 (L) 07/27/2018   LDLCALC 40 07/27/2018   TRIG 86 07/27/2018   CHOLHDL 2.6 07/27/2018    Lab Results  Component Value Date   HGBA1C 5.6 05/15/2012    Assessment & Plan    1.  Persistent atrial fibrillation: -Patient had recent hospitalization due to sepsis and developed AF with RVR that resolved with IV fluids and metoprolol. -Today patient reports rate controlled and per auscultation regular rhythm with occasional PACs/PVCs -He is currently not on anticoagulation due to history of subdural hemorrhage and high fall risk.  2.  History of sick sinus syndrome: -s/p PPM placed 2017 by Dr. Graciela Husbands -Paceart report with normal device function  3.  Lower extremity edema: -Today patient reports  continued swelling that is relieved with Lasix. -He was instructed to elevate extremities when dependent.  4.  Secondary hypercoagulable state: -Patient is at increased risk of stroke due to CHA2DS2-VASc score of 4 however not a candidate for anticoagulation due to high fall risk and history of subdural hemorrhage   Disposition: Follow-up with [name], MD or APP in 12 months   Medication Adjustments/Labs and Tests Ordered: Current medicines are reviewed at length with the patient today.  Concerns regarding medicines are outlined above.   Signed, Napoleon Form, Leodis Rains, NP 06/17/2022, 7:20 PM [facility]  Note:  This document was prepared using Dragon voice recognition software and may include unintentional dictation errors.

## 2022-06-18 ENCOUNTER — Ambulatory Visit: Payer: Medicare HMO | Attending: Nurse Practitioner | Admitting: Nurse Practitioner

## 2022-06-18 ENCOUNTER — Encounter: Payer: Self-pay | Admitting: Nurse Practitioner

## 2022-06-18 VITALS — BP 122/82 | HR 93 | Ht 69.0 in | Wt 201.0 lb

## 2022-06-18 DIAGNOSIS — D6869 Other thrombophilia: Secondary | ICD-10-CM

## 2022-06-18 DIAGNOSIS — R600 Localized edema: Secondary | ICD-10-CM | POA: Diagnosis not present

## 2022-06-18 DIAGNOSIS — I4891 Unspecified atrial fibrillation: Secondary | ICD-10-CM

## 2022-06-18 DIAGNOSIS — I495 Sick sinus syndrome: Secondary | ICD-10-CM

## 2022-06-18 NOTE — Patient Instructions (Signed)
Medication Instructions:  Your physician recommends that you continue on your current medications as directed. Please refer to the Current Medication list given to you today.  *If you need a refill on your cardiac medications before your next appointment, please call your pharmacy*   Follow-Up: At [facility], you and your health needs are our priority.  As part of our continuing mission to provide you with exceptional heart care, we have created designated Provider Care Teams.  These Care Teams include your primary Cardiologist (physician) and Advanced Practice Providers (APPs -  Physician Assistants and Nurse Practitioners) who all work together to provide you with the care you need, when you need it.  We recommend signing up for the patient portal called "MyChart".  Sign up information is provided on this After Visit Summary.  MyChart is used to connect with patients for Virtual Visits (Telemedicine).  Patients are able to view lab/test results, encounter notes, upcoming appointments, etc.  Non-urgent messages can be sent to your provider as well.   To learn more about what you can do with MyChart, go to [email or web address].    Your next appointment:   1 year(s)  The format for your next appointment:   In Person  Provider:   [name], MD    Other Instructions   Important Information About Sugar

## 2022-06-21 ENCOUNTER — Telehealth: Payer: Self-pay | Admitting: Cardiovascular Disease

## 2022-06-21 NOTE — Telephone Encounter (Signed)
Caller would like to get plan of care for this patient.

## 2022-06-21 NOTE — Telephone Encounter (Signed)
[place] and told them Dr. Johnsie Cancel is not following patient for sepsis, PNA, or any of the other non-cardiac issues.

## 2022-06-30 ENCOUNTER — Ambulatory Visit (INDEPENDENT_AMBULATORY_CARE_PROVIDER_SITE_OTHER): Payer: Medicare HMO

## 2022-06-30 DIAGNOSIS — I495 Sick sinus syndrome: Secondary | ICD-10-CM

## 2022-06-30 LAB — CUP PACEART REMOTE DEVICE CHECK
Battery Remaining Longevity: 20 mo
Battery Remaining Percentage: 22 %
Battery Voltage: 2.86 V
Brady Statistic AP VP Percent: 94 %
Brady Statistic AP VS Percent: 1 %
Brady Statistic AS VP Percent: 2.7 %
Brady Statistic AS VS Percent: 1 %
Brady Statistic RA Percent Paced: 64 %
Brady Statistic RV Percent Paced: 70 %
Date Time Interrogation Session: 20231108030503
Implantable Lead Connection Status: 753985
Implantable Lead Connection Status: 753985
Implantable Lead Implant Date: 20170322
Implantable Lead Implant Date: 20170322
Implantable Lead Location: 753859
Implantable Lead Location: 753860
Implantable Pulse Generator Implant Date: 20170322
Lead Channel Impedance Value: 410 Ohm
Lead Channel Impedance Value: 430 Ohm
Lead Channel Pacing Threshold Amplitude: 0.75 V
Lead Channel Pacing Threshold Amplitude: 0.75 V
Lead Channel Pacing Threshold Pulse Width: 0.5 ms
Lead Channel Pacing Threshold Pulse Width: 0.5 ms
Lead Channel Sensing Intrinsic Amplitude: 2.8 mV
Lead Channel Sensing Intrinsic Amplitude: 8.6 mV
Lead Channel Setting Pacing Amplitude: 2 V
Lead Channel Setting Pacing Amplitude: 2.5 V
Lead Channel Setting Pacing Pulse Width: 0.5 ms
Lead Channel Setting Sensing Sensitivity: 2 mV
Pulse Gen Model: 2272
Pulse Gen Serial Number: 3134370

## 2022-07-12 NOTE — Progress Notes (Signed)
Remote pacemaker transmission.   

## 2022-07-12 NOTE — Telephone Encounter (Signed)
Caller wants to follow-up if Dr. Eden Emms will sign Plan of Care for the patient's OT, PT and skilled nursing from Oct. 17.

## 2022-07-13 NOTE — Telephone Encounter (Signed)
Left a message to call back.

## 2022-08-04 ENCOUNTER — Telehealth: Payer: Self-pay | Admitting: Cardiovascular Disease

## 2022-08-04 DIAGNOSIS — Z7689 Persons encountering health services in other specified circumstances: Secondary | ICD-10-CM

## 2022-08-04 MED ORDER — FUROSEMIDE 20 MG PO TABS: ORAL_TABLET | ORAL | 0 refills | Status: DC

## 2022-08-04 NOTE — Telephone Encounter (Signed)
Called patient's son back. Son stated patient's has BLE swelling this is hard and weeping, and has some diarrhea couple of times a week. Asked  if patient has taken any of his PRN lasix for edema. The son stated, no, patient is out of lasix. Sent in refill for lasix. Patient does not have a PCP. Informed patient's son that patient should go to urgent care to get evaluated. Informed patient's son that patient needs a PCP. Patient stated that he cannot find a doctor that is taking new patients, and asked for a referral to a PCP at [facility] with [facility]. Son also stated the only providers taking new patient's were NP. Informed him that I know he would like to have a MD for a provider, but having a NP as his PCP would be better than having no PCP at all. Patient's son agreed. Will forward to Dr. Eden Emms for further advisement.

## 2022-08-04 NOTE — Telephone Encounter (Signed)
[name], MD  You11 minutes ago (4:14 PM)    Can take lasix and f/u with NP/primary or go to urgent care    Called patient's son to let him know of Dr. Eden Emms advisement. Patient's son verbalized understanding.

## 2022-08-04 NOTE — Telephone Encounter (Signed)
Son also added that the patient is having diarrhea as well.  Son stated he has been having this a couple of times a week.

## 2022-08-04 NOTE — Telephone Encounter (Signed)
Pt c/o swelling: STAT is pt has developed SOB within 24 hours  If swelling, where is the swelling located? Both legs and feet  How much weight have you gained and in what time span?   A little bit  Have you gained 3 pounds in a day or 5 pounds in a week?   Less than this  Do you have a log of your daily weights (if so, list)?   No  Are you currently taking a fluid pill?  No  Are you currently SOB?   Patient has COPD  Have you traveled recently?   No  Son stated the swelling has increased and the patient is wheezing.  Son would like to next steps regarding this swelling.

## 2022-08-19 ENCOUNTER — Ambulatory Visit (INDEPENDENT_AMBULATORY_CARE_PROVIDER_SITE_OTHER): Payer: Medicare HMO | Admitting: Family Medicine

## 2022-08-19 ENCOUNTER — Encounter: Payer: Self-pay | Admitting: Family Medicine

## 2022-08-19 VITALS — BP 114/63 | HR 100 | Temp 97.7°F | Resp 16 | Ht 69.0 in | Wt 201.0 lb

## 2022-08-19 DIAGNOSIS — R944 Abnormal results of kidney function studies: Secondary | ICD-10-CM | POA: Diagnosis not present

## 2022-08-19 DIAGNOSIS — I4819 Other persistent atrial fibrillation: Secondary | ICD-10-CM | POA: Diagnosis not present

## 2022-08-19 DIAGNOSIS — R600 Localized edema: Secondary | ICD-10-CM | POA: Diagnosis not present

## 2022-08-19 NOTE — Assessment & Plan Note (Signed)
Unable to anticoagulate due to high fall risk and history of subdural hemorrhage Continue metoprolol Followed by cardiology

## 2022-08-19 NOTE — Assessment & Plan Note (Signed)
-   Let's go ahead and take the lasix for the next 3 days.  - Checking on your kidney function today and we will let you know if we need to change this plan - Schedule cardiology follow-up - Elevated legs when seated, wear compression socks (handout provided for company to get custom size), avoid adding sodium to foods, limit fluids to 1.5 L daily  - If you can safely weigh yourself every day, please keep a log for [place] - Follow-up next week to reassess swelling

## 2022-08-19 NOTE — Progress Notes (Signed)
New Patient Office Visit  Subjective    Patient ID: Justin Baker, male    DOB: 1922/01/21  Age: 88 y.o. MRN: [id number]  CC:  Chief Complaint  Patient presents with   Establish Care    HPI Justin Baker presents to establish care. He has never had a PCP. He is currently living with his son. He typically uses a wheelchair at baseline.   He was hospitalized in September 2023 for sepsis due to possible diverticulitis with subsequent Afib RVR. While admitted he also had AKI which improved with IV fluids. There was concern for possible UTI (culture was not able to be completed due to lost sample) and bilateral lower lobe pneumonia; he completed IV ABX. There was concern for aspiration pneumonia.    Today reports he is feeling alright, but he has noticed his legs swelling. He has not been taking his PRN lasix this week. Patient denies any chest pain, palpitations, dyspnea, wheezing, recurrent headaches, vision changes.    Atrial Fibrillation/Hx of sick sinus syndrome: - Per cardiology note at 06/18/22  follow-up: "Mr. Fester was initially seen by Dr. Eden Emms in 2013 for complaint of atrial fibrillation. TEE was done in hospital to R/O clot showed EF 40-45%, no MV or AV vegetation.  He was treated with amiodarone and started on Eliquis 2.5 mg.  He was felt to be a high bleeding risk due to advanced age and high risk of falls.  His Eliquis was discontinued however in 05/2015 he was admitted to [facility] due to acute CVA due to embolism in the left middle cerebral artery.  He developed sick sinus syndrome and had PPM placed by Dr. Graciela Husbands in 2017.  His amiodarone was discontinued in 2018 and he was admitted 11/2019 due to traumatic frontal lobe sulcus subarachnoid hemorrhage.  He was last seen by Dr. Johney Frame in 07/2021 and patient had declined Watchman at that time with Paceart report showing AF burden of 8%."    - No anticoagulation currently d/t high fall risk and history of subdural  hemorrhage - Currently taking metoprolol tartrate 75 mg BID  Lower extremity edema: - He has only been using PRN lasix since hospital discharge (due to renal function) along with elevating legs and wearing compression socks occasionally - He hasn't taken it in the last few days - He does not have a safe way to weigh himself at home - Echo in 2017 with normal EF (60-65%) and indeterminate diastolic function    Chart review of recent visits and hospital admission as above.    Outpatient Encounter Medications as of 08/19/2022  Medication Sig   atorvastatin (LIPITOR) 40 MG tablet TAKE 1 TABLET BY MOUTH DAILY (Patient taking differently: Take 40 mg by mouth daily.)   furosemide (LASIX) 20 MG tablet Take 1 tablet by mouth as needed for weight gain/swelling   metoprolol tartrate (LOPRESSOR) 25 MG tablet TAKE ONE TABLET BY MOUTH TWICE DAILY (INADDITION TO 50MG  TWICE A DAY) (Patient taking differently: Take 25 mg by mouth 2 (two) times daily. Take in addition to 50 mg twice a day.)   metoprolol tartrate (LOPRESSOR) 50 MG tablet TAKE ONE TABLET BY MOUTH TWICE DAILY (Patient taking differently: Take 50 mg by mouth 2 (two) times daily.)   [DISCONTINUED] ciprofloxacin (CILOXAN) 0.3 % ophthalmic solution Place 2 drops into both eyes every 4 (four) hours while awake. Administer 1 drop, every 2 hours, while awake, for 2 days. Then 1 drop, every 4 hours, while awake, for the  next 5 days.   No facility-administered encounter medications on file as of 08/19/2022.    Past Medical History:  Diagnosis Date   ARF (acute renal failure) (HCC) 05/16/2012   Atrial fibrillation (HCC)    Back pain    COPD (chronic obstructive pulmonary disease) (HCC)    self diagnosed   COVID 01/21/2021   Osteoarthritis    s/p right total hip   Pain in the chest    Shoulder pain, bilateral    Stroke (HCC)    Subdural hematoma (HCC)    Caused by fall   Tachy-brady syndrome (HCC)    a. s/p STJ dual chamber PPM     Thrombocytopenia (HCC)    Viral respiratory illness 07/09/2014    Past Surgical History:  Procedure Laterality Date   CARDIOVERSION  05/17/2012   Procedure: CARDIOVERSION;  Surgeon: Dolores Patty, MD;  Location: [facility] ENDOSCOPY;  Service: Cardiovascular;  Laterality: [name];   CATARACT EXTRACTION W/ INTRAOCULAR LENS IMPLANT     right   EP IMPLANTABLE DEVICE [name] 11/12/2015   SJM Assurity MRI DR PPM implanted by Dr Graciela Husbands for tachy/brady syndrome   PACEMAKER IMPLANT     TEE WITHOUT CARDIOVERSION  05/17/2012   Procedure: TRANSESOPHAGEAL ECHOCARDIOGRAM (TEE);  Surgeon: Dolores Patty, MD;  Location[facility] ENDOSCOPY;  Service: Cardiovascular;  Laterality: [name];   TOTAL HIP ARTHROPLASTY     right    Family History  Problem Relation Age of Onset   Tuberculosis Father    Other Unknown        No known heart disease    Social History   Socioeconomic History   Marital status: Married    Spouse name: Not on file   Number of children: Not on file   Years of education: Not on file   Highest education level: Not on file  Occupational History   Not on file  Tobacco Use   Smoking status: Former   Smokeless tobacco: Former    Quit date: 08/23/1976   Tobacco comments:    Former smoker 09/24/21  Vaping Use   Vaping Use: Never used  Substance and Sexual Activity   Alcohol use: No   Drug use: No   Sexual activity: Not on file  Other Topics Concern   Not on file  Social History Narrative   Not on file   Social Determinants of Health   Financial Resource Strain: Not on file  Food Insecurity: No Food Insecurity (05/18/2022)   Hunger Vital Sign    Worried About Running Out of Food in the Last Year: Never true    Ran Out of Food in the Last Year: Never true  Transportation Needs: No Transportation Needs (05/18/2022)   PRAPARE - Administrator, Civil Service (Medical): No    Lack of Transportation (Non-Medical): No  Physical Activity: Not on file  Stress: Not on file   Social Connections: Not on file  Intimate Partner Violence: Not At Risk (05/18/2022)   Humiliation, Afraid, Rape, and Kick questionnaire    Fear of Current or Ex-Partner: No    Emotionally Abused: No    Physically Abused: No    Sexually Abused: No    ROS All review of systems negative except what is listed in the HPI       Objective    BP 114/63   Pulse 100   Temp 97.7 F (36.5 C)   Resp 16   Ht 5\' 9"  (1.753 m)   Wt 201 lb (  91.2 kg)   SpO2 95%   BMI 29.68 kg/m   Physical Exam Vitals reviewed.  Constitutional:      General: He is not in acute distress.    Appearance: Normal appearance. He is not ill-appearing.  HENT:     Head: Normocephalic and atraumatic.     Left Ear: Tympanic membrane normal.  Eyes:     Conjunctiva/sclera: Conjunctivae normal.  Cardiovascular:     Rate and Rhythm: Normal rate. Rhythm irregular.     Pulses: Normal pulses.     Heart sounds: Normal heart sounds.  Pulmonary:     Effort: Pulmonary effort is normal.     Breath sounds: Normal breath sounds. No wheezing, rhonchi or rales.  Genitourinary:    Comments: Deferred exam Musculoskeletal:        General: Normal range of motion.     Right lower leg: Edema present.     Left lower leg: Edema present.     Comments: BLE +3  Skin:    General: Skin is warm and dry.     Capillary Refill: Capillary refill takes less than 2 seconds.     Comments: Few scabs to bilateral lower legs, no signs of cellulitis   Neurological:     General: No focal deficit present.     Mental Status: He is alert and oriented to person, place, and time. Mental status is at baseline.  Psychiatric:        Mood and Affect: Mood normal.        Behavior: Behavior normal.        Thought Content: Thought content normal.        Judgment: Judgment normal.          Assessment & Plan:   Problem List Items Addressed This Visit       Cardiovascular and Mediastinum   Persistent atrial fibrillation (HCC)    Unable to  anticoagulate due to high fall risk and history of subdural hemorrhage Continue metoprolol Followed by cardiology         Other   Bilateral lower extremity edema - Primary    - Let's go ahead and take the lasix for the next 3 days.  - Checking on your kidney function today and we will let you know if we need to change this plan - Schedule cardiology follow-up - Elevated legs when seated, wear compression socks (handout provided for company to get custom size), avoid adding sodium to foods, limit fluids to 1.5 L daily  - If you can safely weigh yourself every day, please keep a log for [place] - Follow-up next week to reassess swelling      Relevant Orders   CBC with Differential/Platelet   Comprehensive metabolic panel   Brain natriuretic peptide    Return in about 6 days (around 08/25/2022) for edema f/u .   Clayborne Dana, NP

## 2022-08-19 NOTE — Patient Instructions (Addendum)
Leg swelling: - Let's go ahead and take the lasix for the next 3 days.  - Checking on your kidney function today and we will let you know if we need to change this plan - Schedule cardiology follow-up - Elevated legs when seated, wear compression socks (handout provided for company to get custom size), avoid adding sodium to foods, limit fluids to 1.5 L daily  - If you can safely weight yourself every day, please keep a log for [place] - Follow-up next week to reassess swelling

## 2022-08-20 ENCOUNTER — Telehealth: Payer: Self-pay | Admitting: Cardiovascular Disease

## 2022-08-20 LAB — COMPREHENSIVE METABOLIC PANEL
ALT: 9 U/L (ref 0–53)
AST: 14 U/L (ref 0–37)
Albumin: 3.7 g/dL (ref 3.5–5.2)
Alkaline Phosphatase: 57 U/L (ref 39–117)
BUN: 24 mg/dL — ABNORMAL HIGH (ref 6–23)
CO2: 30 mEq/L (ref 19–32)
Calcium: 8.8 mg/dL (ref 8.4–10.5)
Chloride: 107 mEq/L (ref 96–112)
Creatinine, Ser: 1.59 mg/dL — ABNORMAL HIGH (ref 0.40–1.50)
GFR: 35.97 mL/min — ABNORMAL LOW (ref >60.00)
Glucose, Bld: 93 mg/dL (ref 70–99)
Potassium: 3.7 mEq/L (ref 3.5–5.1)
Sodium: 145 mEq/L (ref 135–145)
Total Bilirubin: 1.4 mg/dL — ABNORMAL HIGH (ref 0.2–1.2)
Total Protein: 6 g/dL (ref 6.0–8.3)

## 2022-08-20 LAB — CBC WITH DIFFERENTIAL/PLATELET
Basophils Absolute: 0.1 10*3/uL (ref 0.0–0.1)
Basophils Relative: 1 % (ref 0.0–3.0)
Eosinophils Absolute: 0.1 10*3/uL (ref 0.0–0.7)
Eosinophils Relative: 1.7 % (ref 0.0–5.0)
HCT: 40 % (ref 39.0–52.0)
Hemoglobin: 13.4 g/dL (ref 13.0–17.0)
Lymphocytes Relative: 24.8 % (ref 12.0–46.0)
Lymphs Abs: 1.8 10*3/uL (ref 0.7–4.0)
MCHC: 33.4 g/dL (ref 30.0–36.0)
MCV: 103.9 fl — ABNORMAL HIGH (ref 78.0–100.0)
Monocytes Absolute: 0.6 10*3/uL (ref 0.1–1.0)
Monocytes Relative: 7.9 % (ref 3.0–12.0)
Neutro Abs: 4.8 10*3/uL (ref 1.4–7.7)
Neutrophils Relative %: 64.6 % (ref 43.0–77.0)
Platelets: 130 10*3/uL — ABNORMAL LOW (ref 150.0–400.0)
RBC: 3.85 Mil/uL — ABNORMAL LOW (ref 4.22–5.81)
RDW: 14.8 % (ref 11.5–15.5)
WBC: 7.4 10*3/uL (ref 4.0–10.5)

## 2022-08-20 LAB — BRAIN NATRIURETIC PEPTIDE: Pro B Natriuretic peptide (BNP): 739 pg/mL — ABNORMAL HIGH (ref 0.0–100.0)

## 2022-08-20 NOTE — Progress Notes (Signed)
Called and spoke with daughter-in-law regarding results and result note from Dr. Eden Emms.  She reports that they (patient and family) have had quality of life discussions and he is not interested in seeing a nephrologist or pursuing any additional measures. Would like to focus more on quality of life and comfort measures. Will ask referral coordinator to cancel referral.  They will give him lasix for the next few days to help with the swelling and then go back to PRN use. He does not like taking it more than needed due to increased urination. If not improving or if new symptoms arise they will bring him in for follow-up next week, otherwise he has a hard time getting out of the house and prefers not to follow-up if feeling well.   He is a DNR and they are aware of if/when to seek emergent care.   Lollie Marrow Reola Calkins, DNP, FNP-C

## 2022-08-20 NOTE — Telephone Encounter (Signed)
Pt had his labs drawn by PCP office today and he was advised to make an appt. Pt son would like for Dr. Eden Emms to review lab work first before scheduling if he even needs to.

## 2022-08-20 NOTE — Addendum Note (Signed)
Addended by: Hyman Hopes B on: 08/20/2022 03:42 PM   Modules accepted: Orders

## 2022-08-20 NOTE — Telephone Encounter (Signed)
Per Dr. Eden Emms, His Cr is not that far off baseline He is 89 with some lower extremity edema not sure this is an emergency He can tolerate and take lasix 20 mg daily and take an extra one during the day if needed. Patient's son stated PCP already call and discussed this with them. Patient wants to follow up sooner. Will make patient an appointment next available, March.

## 2022-08-25 ENCOUNTER — Ambulatory Visit: Payer: Medicare HMO | Admitting: Family Medicine

## 2022-09-03 ENCOUNTER — Other Ambulatory Visit: Payer: Self-pay | Admitting: Cardiovascular Disease

## 2022-09-14 ENCOUNTER — Other Ambulatory Visit: Payer: Self-pay | Admitting: Cardiovascular Disease

## 2022-09-27 ENCOUNTER — Other Ambulatory Visit (HOSPITAL_COMMUNITY): Payer: Self-pay | Admitting: Physician Assistant

## 2022-09-27 ENCOUNTER — Other Ambulatory Visit: Payer: Self-pay | Admitting: Cardiovascular Disease

## 2022-09-29 ENCOUNTER — Other Ambulatory Visit (HOSPITAL_COMMUNITY): Payer: Self-pay | Admitting: Physician Assistant

## 2022-09-29 ENCOUNTER — Ambulatory Visit (INDEPENDENT_AMBULATORY_CARE_PROVIDER_SITE_OTHER): Payer: Medicare HMO

## 2022-09-29 DIAGNOSIS — I495 Sick sinus syndrome: Secondary | ICD-10-CM | POA: Diagnosis not present

## 2022-09-29 LAB — CUP PACEART REMOTE DEVICE CHECK
Battery Remaining Longevity: 19 mo
Battery Remaining Percentage: 19 %
Battery Voltage: 2.87 V
Brady Statistic AP VP Percent: 94 %
Brady Statistic AP VS Percent: 1 %
Brady Statistic AS VP Percent: 2.9 %
Brady Statistic AS VS Percent: 1 %
Brady Statistic RA Percent Paced: 51 %
Brady Statistic RV Percent Paced: 61 %
Date Time Interrogation Session: 20240207054944
Implantable Lead Connection Status: 753985
Implantable Lead Connection Status: 753985
Implantable Lead Implant Date: 20170322
Implantable Lead Implant Date: 20170322
Implantable Lead Location: 753859
Implantable Lead Location: 753860
Implantable Pulse Generator Implant Date: 20170322
Lead Channel Impedance Value: 390 Ohm
Lead Channel Impedance Value: 390 Ohm
Lead Channel Pacing Threshold Amplitude: 0.75 V
Lead Channel Pacing Threshold Amplitude: 0.75 V
Lead Channel Pacing Threshold Pulse Width: 0.5 ms
Lead Channel Pacing Threshold Pulse Width: 0.5 ms
Lead Channel Sensing Intrinsic Amplitude: 2.8 mV
Lead Channel Sensing Intrinsic Amplitude: 5.7 mV
Lead Channel Setting Pacing Amplitude: 2 V
Lead Channel Setting Pacing Amplitude: 2.5 V
Lead Channel Setting Pacing Pulse Width: 0.5 ms
Lead Channel Setting Sensing Sensitivity: 2 mV
Pulse Gen Model: 2272
Pulse Gen Serial Number: 3134370

## 2022-09-30 MED ORDER — METOPROLOL TARTRATE 50 MG PO TABS: 50.0000 mg | ORAL_TABLET | Freq: Two times a day (BID) | ORAL | 2 refills | Status: DC

## 2022-10-21 DIAGNOSIS — J439 Emphysema, unspecified: Secondary | ICD-10-CM | POA: Diagnosis not present

## 2022-10-21 DIAGNOSIS — I4891 Unspecified atrial fibrillation: Secondary | ICD-10-CM | POA: Diagnosis not present

## 2022-10-21 DIAGNOSIS — R0902 Hypoxemia: Secondary | ICD-10-CM | POA: Diagnosis not present

## 2022-10-21 DIAGNOSIS — D539 Nutritional anemia, unspecified: Secondary | ICD-10-CM | POA: Diagnosis not present

## 2022-10-21 DIAGNOSIS — I5023 Acute on chronic systolic (congestive) heart failure: Secondary | ICD-10-CM | POA: Diagnosis not present

## 2022-10-21 DIAGNOSIS — R0602 Shortness of breath: Secondary | ICD-10-CM | POA: Diagnosis not present

## 2022-10-21 DIAGNOSIS — Z043 Encounter for examination and observation following other accident: Secondary | ICD-10-CM | POA: Diagnosis not present

## 2022-10-21 DIAGNOSIS — J449 Chronic obstructive pulmonary disease, unspecified: Secondary | ICD-10-CM | POA: Diagnosis not present

## 2022-10-21 DIAGNOSIS — I959 Hypotension, unspecified: Secondary | ICD-10-CM | POA: Diagnosis not present

## 2022-10-21 DIAGNOSIS — I110 Hypertensive heart disease with heart failure: Secondary | ICD-10-CM | POA: Diagnosis not present

## 2022-10-21 DIAGNOSIS — Z87891 Personal history of nicotine dependence: Secondary | ICD-10-CM | POA: Diagnosis not present

## 2022-10-21 DIAGNOSIS — R600 Localized edema: Secondary | ICD-10-CM | POA: Diagnosis not present

## 2022-10-21 DIAGNOSIS — R000 Tachycardia, unspecified: Secondary | ICD-10-CM | POA: Diagnosis not present

## 2022-10-21 DIAGNOSIS — Z993 Dependence on wheelchair: Secondary | ICD-10-CM | POA: Diagnosis not present

## 2022-10-21 DIAGNOSIS — Z8673 Personal history of transient ischemic attack (TIA), and cerebral infarction without residual deficits: Secondary | ICD-10-CM | POA: Diagnosis not present

## 2022-10-21 DIAGNOSIS — I5033 Acute on chronic diastolic (congestive) heart failure: Secondary | ICD-10-CM | POA: Diagnosis not present

## 2022-10-21 DIAGNOSIS — J90 Pleural effusion, not elsewhere classified: Secondary | ICD-10-CM | POA: Diagnosis not present

## 2022-10-21 DIAGNOSIS — I509 Heart failure, unspecified: Secondary | ICD-10-CM | POA: Diagnosis not present

## 2022-10-21 DIAGNOSIS — Z20822 Contact with and (suspected) exposure to covid-19: Secondary | ICD-10-CM | POA: Diagnosis not present

## 2022-10-21 DIAGNOSIS — E785 Hyperlipidemia, unspecified: Secondary | ICD-10-CM | POA: Diagnosis not present

## 2022-10-21 DIAGNOSIS — R531 Weakness: Secondary | ICD-10-CM | POA: Diagnosis not present

## 2022-10-22 DIAGNOSIS — E785 Hyperlipidemia, unspecified: Secondary | ICD-10-CM | POA: Diagnosis not present

## 2022-10-22 DIAGNOSIS — D539 Nutritional anemia, unspecified: Secondary | ICD-10-CM | POA: Diagnosis not present

## 2022-10-22 DIAGNOSIS — R0609 Other forms of dyspnea: Secondary | ICD-10-CM | POA: Diagnosis not present

## 2022-10-22 DIAGNOSIS — R0601 Orthopnea: Secondary | ICD-10-CM | POA: Diagnosis not present

## 2022-10-22 DIAGNOSIS — J449 Chronic obstructive pulmonary disease, unspecified: Secondary | ICD-10-CM | POA: Diagnosis not present

## 2022-10-22 DIAGNOSIS — Z8673 Personal history of transient ischemic attack (TIA), and cerebral infarction without residual deficits: Secondary | ICD-10-CM | POA: Diagnosis not present

## 2022-10-22 DIAGNOSIS — I4891 Unspecified atrial fibrillation: Secondary | ICD-10-CM | POA: Diagnosis not present

## 2022-10-22 DIAGNOSIS — D518 Other vitamin B12 deficiency anemias: Secondary | ICD-10-CM | POA: Insufficient documentation

## 2022-10-22 DIAGNOSIS — I517 Cardiomegaly: Secondary | ICD-10-CM | POA: Diagnosis not present

## 2022-10-22 DIAGNOSIS — E538 Deficiency of other specified B group vitamins: Secondary | ICD-10-CM | POA: Diagnosis not present

## 2022-10-23 DIAGNOSIS — N1832 Chronic kidney disease, stage 3b: Secondary | ICD-10-CM | POA: Insufficient documentation

## 2022-10-23 DIAGNOSIS — J439 Emphysema, unspecified: Secondary | ICD-10-CM | POA: Insufficient documentation

## 2022-10-23 DIAGNOSIS — I4891 Unspecified atrial fibrillation: Secondary | ICD-10-CM | POA: Diagnosis not present

## 2022-10-23 DIAGNOSIS — I5033 Acute on chronic diastolic (congestive) heart failure: Secondary | ICD-10-CM | POA: Insufficient documentation

## 2022-10-23 DIAGNOSIS — I493 Ventricular premature depolarization: Secondary | ICD-10-CM | POA: Diagnosis not present

## 2022-10-23 DIAGNOSIS — I5032 Chronic diastolic (congestive) heart failure: Secondary | ICD-10-CM | POA: Insufficient documentation

## 2022-10-24 DIAGNOSIS — I959 Hypotension, unspecified: Secondary | ICD-10-CM | POA: Diagnosis not present

## 2022-10-24 DIAGNOSIS — Z043 Encounter for examination and observation following other accident: Secondary | ICD-10-CM | POA: Diagnosis not present

## 2022-10-24 DIAGNOSIS — R531 Weakness: Secondary | ICD-10-CM | POA: Diagnosis not present

## 2022-10-24 DIAGNOSIS — I499 Cardiac arrhythmia, unspecified: Secondary | ICD-10-CM | POA: Diagnosis not present

## 2022-10-24 DIAGNOSIS — Z743 Need for continuous supervision: Secondary | ICD-10-CM | POA: Diagnosis not present

## 2022-10-24 DIAGNOSIS — Z8709 Personal history of other diseases of the respiratory system: Secondary | ICD-10-CM | POA: Insufficient documentation

## 2022-10-24 DIAGNOSIS — J439 Emphysema, unspecified: Secondary | ICD-10-CM | POA: Diagnosis not present

## 2022-10-24 DIAGNOSIS — I5023 Acute on chronic systolic (congestive) heart failure: Secondary | ICD-10-CM | POA: Diagnosis not present

## 2022-10-24 DIAGNOSIS — I5033 Acute on chronic diastolic (congestive) heart failure: Secondary | ICD-10-CM | POA: Diagnosis not present

## 2022-10-24 DIAGNOSIS — I480 Paroxysmal atrial fibrillation: Secondary | ICD-10-CM | POA: Diagnosis not present

## 2022-10-24 DIAGNOSIS — I4891 Unspecified atrial fibrillation: Secondary | ICD-10-CM | POA: Diagnosis not present

## 2022-10-24 DIAGNOSIS — R0902 Hypoxemia: Secondary | ICD-10-CM | POA: Diagnosis not present

## 2022-10-24 DIAGNOSIS — R000 Tachycardia, unspecified: Secondary | ICD-10-CM | POA: Diagnosis not present

## 2022-10-25 DIAGNOSIS — R531 Weakness: Secondary | ICD-10-CM | POA: Diagnosis not present

## 2022-10-25 DIAGNOSIS — H547 Unspecified visual loss: Secondary | ICD-10-CM | POA: Insufficient documentation

## 2022-10-26 DIAGNOSIS — R531 Weakness: Secondary | ICD-10-CM | POA: Diagnosis not present

## 2022-10-26 NOTE — Progress Notes (Signed)
Remote pacemaker transmission.   

## 2022-10-27 DIAGNOSIS — R0902 Hypoxemia: Secondary | ICD-10-CM | POA: Diagnosis not present

## 2022-10-27 DIAGNOSIS — J449 Chronic obstructive pulmonary disease, unspecified: Secondary | ICD-10-CM | POA: Diagnosis not present

## 2022-10-27 DIAGNOSIS — J439 Emphysema, unspecified: Secondary | ICD-10-CM | POA: Diagnosis not present

## 2022-10-27 DIAGNOSIS — R600 Localized edema: Secondary | ICD-10-CM | POA: Diagnosis not present

## 2022-10-27 DIAGNOSIS — Z79899 Other long term (current) drug therapy: Secondary | ICD-10-CM | POA: Diagnosis not present

## 2022-10-27 DIAGNOSIS — Z993 Dependence on wheelchair: Secondary | ICD-10-CM | POA: Diagnosis not present

## 2022-10-27 DIAGNOSIS — E785 Hyperlipidemia, unspecified: Secondary | ICD-10-CM | POA: Diagnosis not present

## 2022-10-27 DIAGNOSIS — R260 Ataxic gait: Secondary | ICD-10-CM | POA: Diagnosis not present

## 2022-10-27 DIAGNOSIS — I5020 Unspecified systolic (congestive) heart failure: Secondary | ICD-10-CM | POA: Diagnosis not present

## 2022-10-27 DIAGNOSIS — I6930 Unspecified sequelae of cerebral infarction: Secondary | ICD-10-CM | POA: Diagnosis not present

## 2022-10-27 DIAGNOSIS — Z20822 Contact with and (suspected) exposure to covid-19: Secondary | ICD-10-CM | POA: Diagnosis not present

## 2022-10-27 DIAGNOSIS — D539 Nutritional anemia, unspecified: Secondary | ICD-10-CM | POA: Diagnosis not present

## 2022-10-27 DIAGNOSIS — I4891 Unspecified atrial fibrillation: Secondary | ICD-10-CM | POA: Diagnosis not present

## 2022-10-27 DIAGNOSIS — I5033 Acute on chronic diastolic (congestive) heart failure: Secondary | ICD-10-CM | POA: Diagnosis not present

## 2022-10-27 DIAGNOSIS — Z743 Need for continuous supervision: Secondary | ICD-10-CM | POA: Diagnosis not present

## 2022-10-27 DIAGNOSIS — J90 Pleural effusion, not elsewhere classified: Secondary | ICD-10-CM | POA: Diagnosis not present

## 2022-10-27 DIAGNOSIS — R531 Weakness: Secondary | ICD-10-CM | POA: Diagnosis not present

## 2022-10-27 DIAGNOSIS — I480 Paroxysmal atrial fibrillation: Secondary | ICD-10-CM | POA: Diagnosis not present

## 2022-10-27 DIAGNOSIS — N1832 Chronic kidney disease, stage 3b: Secondary | ICD-10-CM | POA: Diagnosis not present

## 2022-10-27 DIAGNOSIS — I5043 Acute on chronic combined systolic (congestive) and diastolic (congestive) heart failure: Secondary | ICD-10-CM | POA: Diagnosis not present

## 2022-10-27 DIAGNOSIS — R54 Age-related physical debility: Secondary | ICD-10-CM | POA: Diagnosis not present

## 2022-10-28 DIAGNOSIS — Z79899 Other long term (current) drug therapy: Secondary | ICD-10-CM | POA: Diagnosis not present

## 2022-11-07 DIAGNOSIS — I5033 Acute on chronic diastolic (congestive) heart failure: Secondary | ICD-10-CM | POA: Diagnosis not present

## 2022-11-07 DIAGNOSIS — R600 Localized edema: Secondary | ICD-10-CM | POA: Diagnosis not present

## 2022-11-09 NOTE — Progress Notes (Deleted)
Patient ID: Justin Baker, male   DOB: 1922/06/26, 89 y.o.   MRN: [id number]   11/09/2022 Justin Baker   10/26/1921  [id number]  Primary Physician Terrilyn Saver, NP Primary Cardiologist: Dr. Johnsie Cancel   Reason for Visit/CC:  Pacer/ PAF   HPI: 89 y.o. with PAF first diagnosed in 2013. Initially poor risk for anticoagulation due to age and fall risk but had CVA at [facility] October 2016. TTE showed normal EF. Telemetry with PAF. Eventually settled on low dose Eliquis. CHADS2VASC 4   11/12/15 Had SJM Assurity MRI safe pacer implanted by Dr Caryl Comes for tachy brady syndrome Device normal function with no PAF Amiodarone d/c 2018 for decreased DLCO   Seen at [facility] April 2021 for traumatic frontal lobe sulcal subarachnoid hemorrhage no mass effect or hydrocephalus he was leaning over to get something off floor and fell F/U CT 12/03/19 with resolution of subarachnoid hemorrhage His eliquis has been on hold   He feels much better off eliquis He is in NSR  Eliquis d/c 06/2020 shared decision making with patient and son . He also did not want to entertain the idea of Watchman  Wife has dementia and in NH now  He is walking well and more stable on feet with walker   Had COVID in June  2023 hospitalized in [facility] for 3 weeks and sent to rehab got to room with his wife for a few weeks.   Hospitalized 9/26-10/3/23 for sepsis ? From diverticulitis Seen by palliative care Had period of PAF/flutter and converted with metoprolol   ***  Current Outpatient Medications  Medication Sig Dispense Refill   atorvastatin (LIPITOR) 40 MG tablet Take 1 tablet (40 mg total) by mouth daily. 90 tablet 3   furosemide (LASIX) 20 MG tablet TAKE 1 TABLET BY MOUTH AS NEEDED FOR WEIGHT GAIN/SWELLING 30 tablet 5   metoprolol tartrate (LOPRESSOR) 25 MG tablet TAKE ONE TABLET BY MOUTH TWICE DAILY (INADDITION TO 50MG  TWICE A DAY) 180 tablet 2   metoprolol tartrate (LOPRESSOR) 50 MG tablet Take 1 tablet (50 mg total) by mouth 2 (two)  times daily. In addition to the 25 mg twice daily 180 tablet 2   No current facility-administered medications for this visit.    No Known Allergies  Social History   Socioeconomic History   Marital status: Married    Spouse name: Not on file   Number of children: Not on file   Years of education: Not on file   Highest education level: Not on file  Occupational History   Not on file  Tobacco Use   Smoking status: Former   Smokeless tobacco: Former    Quit date: 08/23/1976   Tobacco comments:    Former smoker 09/24/21  Vaping Use   Vaping Use: Never used  Substance and Sexual Activity   Alcohol use: No   Drug use: No   Sexual activity: Not on file  Other Topics Concern   Not on file  Social History Narrative   Not on file   Social Determinants of Health   Financial Resource Strain: Not on file  Food Insecurity: No Food Insecurity (05/18/2022)   Hunger Vital Sign    Worried About Running Out of Food in the Last Year: Never true    Ran Out of Food in the Last Year: Never true  Transportation Needs: No Transportation Needs (05/18/2022)   PRAPARE - Hydrologist (Medical): No    Lack of Transportation (Non-Medical):  No  Physical Activity: Not on file  Stress: Not on file  Social Connections: Not on file  Intimate Partner Violence: Not At Risk (05/18/2022)   Humiliation, Afraid, Rape, and Kick questionnaire    Fear of Current or Ex-Partner: No    Emotionally Abused: No    Physically Abused: No    Sexually Abused: No     Review of Systems: General: negative for chills, fever, night sweats or weight changes.  Cardiovascular: negative for chest pain, dyspnea on exertion, edema, orthopnea, palpitations, paroxysmal nocturnal dyspnea or shortness of breath Dermatological: negative for rash Respiratory: negative for cough or wheezing Urologic: negative for hematuria Abdominal: negative for nausea, vomiting, diarrhea, bright red blood per rectum,  melena, or hematemesis Neurologic: negative for visual changes, syncope, or dizziness All other systems reviewed and are otherwise negative except as noted above.    There were no vitals taken for this visit.   Affect appropriate Chronically ill male  HEENT: normal Neck supple with no adenopathy JVP normal no bruits no thyromegaly Lungs clear with no wheezing and good diaphragmatic motion Heart:  S1/S2 no murmur, no rub, gallop or click PMI normal pacer under left clavicle  Abdomen: benighn, BS positve, no tenderness, no AAA no bruit.  No HSM or HJR Distal pulses intact with no bruits No edema Neuro lest dysarthria improved  Skin warm and dry No muscular weakness   EKG  06/16/15  06/08/19 AV pacing rate 61 LBBB pattern   ASSESSMENT AND PLAN:   1. PAF: currently in SR  With AV pacing  Amiodarone d/c due to abnormal DLCO in 2018    2. CVA/SAH: traumatic from fall f/u CT 12/03/19 with resolution no further DOAC Given age not clear He would be a Watchman FLX candidate and patient declines anyway    3. Pacer:  Normal function rate response adjusted but still with some fatigue f/u EP St Jude device implant 2017  4. HLD:  On statin  LDL 40 great  5. Health Maintenance : no primary  palliative care following     PLAN  F/u with EP only given age and only issues being PAF/PPM  [name]  11/09/2022 5:39 PM

## 2022-11-12 DIAGNOSIS — R54 Age-related physical debility: Secondary | ICD-10-CM | POA: Diagnosis not present

## 2022-11-12 DIAGNOSIS — R260 Ataxic gait: Secondary | ICD-10-CM | POA: Diagnosis not present

## 2022-11-12 DIAGNOSIS — R600 Localized edema: Secondary | ICD-10-CM | POA: Diagnosis not present

## 2022-11-12 DIAGNOSIS — I5033 Acute on chronic diastolic (congestive) heart failure: Secondary | ICD-10-CM | POA: Diagnosis not present

## 2022-11-13 DIAGNOSIS — I5021 Acute systolic (congestive) heart failure: Secondary | ICD-10-CM | POA: Diagnosis not present

## 2022-11-13 DIAGNOSIS — I5033 Acute on chronic diastolic (congestive) heart failure: Secondary | ICD-10-CM | POA: Diagnosis not present

## 2022-11-13 DIAGNOSIS — I130 Hypertensive heart and chronic kidney disease with heart failure and stage 1 through stage 4 chronic kidney disease, or unspecified chronic kidney disease: Secondary | ICD-10-CM | POA: Diagnosis not present

## 2022-11-13 DIAGNOSIS — J4489 Other specified chronic obstructive pulmonary disease: Secondary | ICD-10-CM | POA: Diagnosis not present

## 2022-11-13 DIAGNOSIS — M549 Dorsalgia, unspecified: Secondary | ICD-10-CM | POA: Diagnosis not present

## 2022-11-13 DIAGNOSIS — J439 Emphysema, unspecified: Secondary | ICD-10-CM | POA: Diagnosis not present

## 2022-11-13 DIAGNOSIS — N1832 Chronic kidney disease, stage 3b: Secondary | ICD-10-CM | POA: Diagnosis not present

## 2022-11-13 DIAGNOSIS — I480 Paroxysmal atrial fibrillation: Secondary | ICD-10-CM | POA: Diagnosis not present

## 2022-11-13 DIAGNOSIS — I495 Sick sinus syndrome: Secondary | ICD-10-CM | POA: Diagnosis not present

## 2022-11-16 ENCOUNTER — Ambulatory Visit: Payer: Medicare HMO | Admitting: Cardiovascular Disease

## 2022-11-17 ENCOUNTER — Ambulatory Visit: Payer: Medicare HMO | Admitting: Family Medicine

## 2022-11-17 DIAGNOSIS — I130 Hypertensive heart and chronic kidney disease with heart failure and stage 1 through stage 4 chronic kidney disease, or unspecified chronic kidney disease: Secondary | ICD-10-CM | POA: Diagnosis not present

## 2022-11-17 DIAGNOSIS — J4489 Other specified chronic obstructive pulmonary disease: Secondary | ICD-10-CM | POA: Diagnosis not present

## 2022-11-17 DIAGNOSIS — I5021 Acute systolic (congestive) heart failure: Secondary | ICD-10-CM | POA: Diagnosis not present

## 2022-11-17 DIAGNOSIS — N1832 Chronic kidney disease, stage 3b: Secondary | ICD-10-CM | POA: Diagnosis not present

## 2022-11-17 DIAGNOSIS — M549 Dorsalgia, unspecified: Secondary | ICD-10-CM | POA: Diagnosis not present

## 2022-11-17 DIAGNOSIS — I480 Paroxysmal atrial fibrillation: Secondary | ICD-10-CM | POA: Diagnosis not present

## 2022-11-17 DIAGNOSIS — I495 Sick sinus syndrome: Secondary | ICD-10-CM | POA: Diagnosis not present

## 2022-11-17 DIAGNOSIS — J439 Emphysema, unspecified: Secondary | ICD-10-CM | POA: Diagnosis not present

## 2022-11-17 DIAGNOSIS — I5033 Acute on chronic diastolic (congestive) heart failure: Secondary | ICD-10-CM | POA: Diagnosis not present

## 2022-11-18 ENCOUNTER — Encounter: Payer: Self-pay | Admitting: Family Medicine

## 2022-11-18 DIAGNOSIS — J449 Chronic obstructive pulmonary disease, unspecified: Secondary | ICD-10-CM

## 2022-11-18 MED ORDER — UMECLIDINIUM-VILANTEROL 62.5-25 MCG/ACT IN AEPB: 1.0000 | INHALATION_SPRAY | Freq: Every day | RESPIRATORY_TRACT | 3 refills | Status: DC

## 2022-11-26 DIAGNOSIS — M549 Dorsalgia, unspecified: Secondary | ICD-10-CM | POA: Diagnosis not present

## 2022-11-26 DIAGNOSIS — N1832 Chronic kidney disease, stage 3b: Secondary | ICD-10-CM | POA: Diagnosis not present

## 2022-11-26 DIAGNOSIS — I480 Paroxysmal atrial fibrillation: Secondary | ICD-10-CM | POA: Diagnosis not present

## 2022-11-26 DIAGNOSIS — I495 Sick sinus syndrome: Secondary | ICD-10-CM | POA: Diagnosis not present

## 2022-11-26 DIAGNOSIS — I5021 Acute systolic (congestive) heart failure: Secondary | ICD-10-CM | POA: Diagnosis not present

## 2022-11-26 DIAGNOSIS — I5033 Acute on chronic diastolic (congestive) heart failure: Secondary | ICD-10-CM | POA: Diagnosis not present

## 2022-11-26 DIAGNOSIS — I130 Hypertensive heart and chronic kidney disease with heart failure and stage 1 through stage 4 chronic kidney disease, or unspecified chronic kidney disease: Secondary | ICD-10-CM | POA: Diagnosis not present

## 2022-11-26 DIAGNOSIS — J439 Emphysema, unspecified: Secondary | ICD-10-CM | POA: Diagnosis not present

## 2022-11-26 DIAGNOSIS — J4489 Other specified chronic obstructive pulmonary disease: Secondary | ICD-10-CM | POA: Diagnosis not present

## 2022-11-30 ENCOUNTER — Telehealth: Payer: Self-pay

## 2022-11-30 DIAGNOSIS — J449 Chronic obstructive pulmonary disease, unspecified: Secondary | ICD-10-CM

## 2022-11-30 MED ORDER — SPIRIVA RESPIMAT 2.5 MCG/ACT IN AERS
2.0000 | INHALATION_SPRAY | Freq: Every day | RESPIRATORY_TRACT | 10 refills | Status: DC
  Filled 2023-09-05: qty 4, 30d supply, fill #0

## 2022-11-30 NOTE — Telephone Encounter (Signed)
Anoro Ellipta not covered. Preferred alternatives:   Rolene Arbour Not Required Markus Daft Aerosphere Not Required Combivent Respimat Not Required Spiriva Respimat 2.5 mcg Not Required Stiolto Respimat Not Required Striverdi Respimat Not Required

## 2022-12-03 DIAGNOSIS — I495 Sick sinus syndrome: Secondary | ICD-10-CM | POA: Diagnosis not present

## 2022-12-03 DIAGNOSIS — M549 Dorsalgia, unspecified: Secondary | ICD-10-CM | POA: Diagnosis not present

## 2022-12-03 DIAGNOSIS — I5021 Acute systolic (congestive) heart failure: Secondary | ICD-10-CM | POA: Diagnosis not present

## 2022-12-03 DIAGNOSIS — N1832 Chronic kidney disease, stage 3b: Secondary | ICD-10-CM | POA: Diagnosis not present

## 2022-12-03 DIAGNOSIS — J4489 Other specified chronic obstructive pulmonary disease: Secondary | ICD-10-CM | POA: Diagnosis not present

## 2022-12-03 DIAGNOSIS — J439 Emphysema, unspecified: Secondary | ICD-10-CM | POA: Diagnosis not present

## 2022-12-03 DIAGNOSIS — I5033 Acute on chronic diastolic (congestive) heart failure: Secondary | ICD-10-CM | POA: Diagnosis not present

## 2022-12-03 DIAGNOSIS — I130 Hypertensive heart and chronic kidney disease with heart failure and stage 1 through stage 4 chronic kidney disease, or unspecified chronic kidney disease: Secondary | ICD-10-CM | POA: Diagnosis not present

## 2022-12-03 DIAGNOSIS — I480 Paroxysmal atrial fibrillation: Secondary | ICD-10-CM | POA: Diagnosis not present

## 2022-12-09 DIAGNOSIS — N1832 Chronic kidney disease, stage 3b: Secondary | ICD-10-CM | POA: Diagnosis not present

## 2022-12-09 DIAGNOSIS — M549 Dorsalgia, unspecified: Secondary | ICD-10-CM | POA: Diagnosis not present

## 2022-12-09 DIAGNOSIS — I130 Hypertensive heart and chronic kidney disease with heart failure and stage 1 through stage 4 chronic kidney disease, or unspecified chronic kidney disease: Secondary | ICD-10-CM | POA: Diagnosis not present

## 2022-12-09 DIAGNOSIS — J439 Emphysema, unspecified: Secondary | ICD-10-CM | POA: Diagnosis not present

## 2022-12-09 DIAGNOSIS — I480 Paroxysmal atrial fibrillation: Secondary | ICD-10-CM | POA: Diagnosis not present

## 2022-12-09 DIAGNOSIS — I495 Sick sinus syndrome: Secondary | ICD-10-CM | POA: Diagnosis not present

## 2022-12-09 DIAGNOSIS — I5033 Acute on chronic diastolic (congestive) heart failure: Secondary | ICD-10-CM | POA: Diagnosis not present

## 2022-12-09 DIAGNOSIS — J4489 Other specified chronic obstructive pulmonary disease: Secondary | ICD-10-CM | POA: Diagnosis not present

## 2022-12-09 DIAGNOSIS — I5021 Acute systolic (congestive) heart failure: Secondary | ICD-10-CM | POA: Diagnosis not present

## 2022-12-17 DIAGNOSIS — I5033 Acute on chronic diastolic (congestive) heart failure: Secondary | ICD-10-CM | POA: Diagnosis not present

## 2022-12-17 DIAGNOSIS — I495 Sick sinus syndrome: Secondary | ICD-10-CM | POA: Diagnosis not present

## 2022-12-17 DIAGNOSIS — I130 Hypertensive heart and chronic kidney disease with heart failure and stage 1 through stage 4 chronic kidney disease, or unspecified chronic kidney disease: Secondary | ICD-10-CM | POA: Diagnosis not present

## 2022-12-17 DIAGNOSIS — N1832 Chronic kidney disease, stage 3b: Secondary | ICD-10-CM | POA: Diagnosis not present

## 2022-12-17 DIAGNOSIS — M549 Dorsalgia, unspecified: Secondary | ICD-10-CM | POA: Diagnosis not present

## 2022-12-17 DIAGNOSIS — J4489 Other specified chronic obstructive pulmonary disease: Secondary | ICD-10-CM | POA: Diagnosis not present

## 2022-12-17 DIAGNOSIS — I5021 Acute systolic (congestive) heart failure: Secondary | ICD-10-CM | POA: Diagnosis not present

## 2022-12-17 DIAGNOSIS — J439 Emphysema, unspecified: Secondary | ICD-10-CM | POA: Diagnosis not present

## 2022-12-17 DIAGNOSIS — I480 Paroxysmal atrial fibrillation: Secondary | ICD-10-CM | POA: Diagnosis not present

## 2022-12-20 ENCOUNTER — Encounter: Payer: Self-pay | Admitting: Family Medicine

## 2022-12-20 MED ORDER — METOPROLOL SUCCINATE ER 50 MG PO TB24: 75.0000 mg | ORAL_TABLET | Freq: Every day | ORAL | 0 refills | Status: DC

## 2022-12-20 NOTE — Telephone Encounter (Signed)
Is this something you would prescribe or should this be managed by Cardiology?

## 2022-12-20 NOTE — Telephone Encounter (Signed)
Confirmed, pended. Since 1.5 tablets wanted you to review and sign if appropriate.

## 2022-12-22 DIAGNOSIS — I495 Sick sinus syndrome: Secondary | ICD-10-CM | POA: Diagnosis not present

## 2022-12-22 DIAGNOSIS — M549 Dorsalgia, unspecified: Secondary | ICD-10-CM | POA: Diagnosis not present

## 2022-12-22 DIAGNOSIS — N1832 Chronic kidney disease, stage 3b: Secondary | ICD-10-CM | POA: Diagnosis not present

## 2022-12-22 DIAGNOSIS — I5021 Acute systolic (congestive) heart failure: Secondary | ICD-10-CM | POA: Diagnosis not present

## 2022-12-22 DIAGNOSIS — I480 Paroxysmal atrial fibrillation: Secondary | ICD-10-CM | POA: Diagnosis not present

## 2022-12-22 DIAGNOSIS — J4489 Other specified chronic obstructive pulmonary disease: Secondary | ICD-10-CM | POA: Diagnosis not present

## 2022-12-22 DIAGNOSIS — I5033 Acute on chronic diastolic (congestive) heart failure: Secondary | ICD-10-CM | POA: Diagnosis not present

## 2022-12-22 DIAGNOSIS — I130 Hypertensive heart and chronic kidney disease with heart failure and stage 1 through stage 4 chronic kidney disease, or unspecified chronic kidney disease: Secondary | ICD-10-CM | POA: Diagnosis not present

## 2022-12-22 DIAGNOSIS — J439 Emphysema, unspecified: Secondary | ICD-10-CM | POA: Diagnosis not present

## 2022-12-29 ENCOUNTER — Ambulatory Visit (INDEPENDENT_AMBULATORY_CARE_PROVIDER_SITE_OTHER): Payer: Medicare HMO

## 2022-12-29 DIAGNOSIS — I495 Sick sinus syndrome: Secondary | ICD-10-CM

## 2022-12-30 LAB — CUP PACEART REMOTE DEVICE CHECK
Battery Remaining Longevity: 17 mo
Battery Remaining Percentage: 17 %
Battery Voltage: 2.86 V
Brady Statistic AP VP Percent: 94 %
Brady Statistic AP VS Percent: 1 %
Brady Statistic AS VP Percent: 2.9 %
Brady Statistic AS VS Percent: 1 %
Brady Statistic RA Percent Paced: 42 %
Brady Statistic RV Percent Paced: 55 %
Date Time Interrogation Session: 20240508233025
Implantable Lead Connection Status: 753985
Implantable Lead Connection Status: 753985
Implantable Lead Implant Date: 20170322
Implantable Lead Implant Date: 20170322
Implantable Lead Location: 753859
Implantable Lead Location: 753860
Implantable Pulse Generator Implant Date: 20170322
Lead Channel Impedance Value: 390 Ohm
Lead Channel Impedance Value: 400 Ohm
Lead Channel Pacing Threshold Amplitude: 0.75 V
Lead Channel Pacing Threshold Amplitude: 0.75 V
Lead Channel Pacing Threshold Pulse Width: 0.5 ms
Lead Channel Pacing Threshold Pulse Width: 0.5 ms
Lead Channel Sensing Intrinsic Amplitude: 2.8 mV
Lead Channel Sensing Intrinsic Amplitude: 4.3 mV
Lead Channel Setting Pacing Amplitude: 2 V
Lead Channel Setting Pacing Amplitude: 2.5 V
Lead Channel Setting Pacing Pulse Width: 0.5 ms
Lead Channel Setting Sensing Sensitivity: 2 mV
Pulse Gen Model: 2272
Pulse Gen Serial Number: 3134370

## 2023-01-19 NOTE — Progress Notes (Signed)
Remote pacemaker transmission.   

## 2023-02-15 ENCOUNTER — Ambulatory Visit (INDEPENDENT_AMBULATORY_CARE_PROVIDER_SITE_OTHER): Payer: Medicare HMO | Admitting: Family Medicine

## 2023-02-15 ENCOUNTER — Encounter: Payer: Self-pay | Admitting: Family Medicine

## 2023-02-15 VITALS — BP 106/64 | HR 96 | Temp 97.7°F | Ht 69.0 in | Wt 183.0 lb

## 2023-02-15 DIAGNOSIS — L089 Local infection of the skin and subcutaneous tissue, unspecified: Secondary | ICD-10-CM | POA: Diagnosis not present

## 2023-02-15 LAB — BASIC METABOLIC PANEL
BUN: 26 mg/dL — ABNORMAL HIGH (ref 6–23)
CO2: 27 mEq/L (ref 19–32)
Calcium: 9 mg/dL (ref 8.4–10.5)
Chloride: 102 mEq/L (ref 96–112)
Creatinine, Ser: 1.95 mg/dL — ABNORMAL HIGH (ref 0.40–1.50)
GFR: 28.06 mL/min — ABNORMAL LOW (ref >60.00)
Glucose, Bld: 94 mg/dL (ref 70–99)
Potassium: 3.7 mEq/L (ref 3.5–5.1)
Sodium: 139 mEq/L (ref 135–145)

## 2023-02-15 LAB — CBC WITH DIFFERENTIAL/PLATELET
Basophils Absolute: 0.1 10*3/uL (ref 0.0–0.1)
Basophils Relative: 0.7 % (ref 0.0–3.0)
Eosinophils Absolute: 0.2 10*3/uL (ref 0.0–0.7)
Eosinophils Relative: 2.3 % (ref 0.0–5.0)
HCT: 39.9 % (ref 39.0–52.0)
Hemoglobin: 13.1 g/dL (ref 13.0–17.0)
Lymphocytes Relative: 28.9 % (ref 12.0–46.0)
Lymphs Abs: 2.5 10*3/uL (ref 0.7–4.0)
MCHC: 32.9 g/dL (ref 30.0–36.0)
MCV: 100.3 fl — ABNORMAL HIGH (ref 78.0–100.0)
Monocytes Absolute: 0.6 10*3/uL (ref 0.1–1.0)
Monocytes Relative: 6.9 % (ref 3.0–12.0)
Neutro Abs: 5.3 10*3/uL (ref 1.4–7.7)
Neutrophils Relative %: 61.2 % (ref 43.0–77.0)
Platelets: 181 10*3/uL (ref 150.0–400.0)
RBC: 3.98 Mil/uL — ABNORMAL LOW (ref 4.22–5.81)
RDW: 14.3 % (ref 11.5–15.5)
WBC: 8.7 10*3/uL (ref 4.0–10.5)

## 2023-02-15 MED ORDER — DOXYCYCLINE HYCLATE 100 MG PO TABS
100.0000 mg | ORAL_TABLET | Freq: Two times a day (BID) | ORAL | 0 refills | Status: AC
Start: 2023-02-15 — End: 2023-02-25

## 2023-02-15 NOTE — Patient Instructions (Signed)
Start antibiotics. Labs today. Referral to podiatry for infection.  We discussed patient goals of primarily supportive measures - lower extremity edema is chronic per patient. Declined extensive workup - basic labs today. Discussed elevation, compression socks, etc.

## 2023-02-15 NOTE — Progress Notes (Signed)
Acute Office Visit  Subjective:     Patient ID: Justin Baker, male    DOB: Nov 08, 1921, 89 y.o.   MRN: [id number]  Chief Complaint  Patient presents with   Foot Problem    HPI Patient is in today for foot sore. He is here with his son and daughter-in-law.  Discussed the use of AI scribe software for clinical note transcription with the patient, who gave verbal consent to proceed.  History of Present Illness   The patient presents with a wound on his left great toe that has been present for about a week. The wound is located on his left big toe, and there is a similar spot on the right big toe. The patient's son noticed the wound and sent a picture to another family member, who advised the patient to seek medical attention. The patient also has a fungal infection in his toenails. Despite having neuropathy, the patient can feel the bottom of his feet and reports occasional tingling but no pain. The patient uses a wheelchair for mobility and often drags his feet on the carpet, which may contribute to the development of the wounds. The patient's feet are always swollen, but he sometimes props them up at home. He has previously declined excessive workups, referrals, and medications, requesting more supportive measures at home.        ROS All review of systems negative except what is listed in the HPI      Objective:    BP 106/64   Pulse 96   Temp 97.7 F (36.5 C) (Oral)   Ht 5\' 9"  (1.753 m)   Wt 183 lb (83 kg)   SpO2 97%   BMI 27.02 kg/m    Physical Exam Vitals reviewed.  Constitutional:      Appearance: Normal appearance.  Cardiovascular:     Rate and Rhythm: Rhythm irregular.  Pulmonary:     Effort: Pulmonary effort is normal.     Breath sounds: Normal breath sounds.  Musculoskeletal:        General: No tenderness.     Right lower leg: Edema present.     Left lower leg: Edema present.  Skin:    Comments: See left great toe picture below  Neurological:     Mental  Status: He is alert and oriented to person, place, and time.  Psychiatric:        Mood and Affect: Mood normal.        Behavior: Behavior normal.        Thought Content: Thought content normal.        Judgment: Judgment normal.        Results for orders placed or performed in visit on 02/15/23  Basic metabolic panel  Result Value Ref Range   Sodium 139 135 - 145 mEq/L   Potassium 3.7 3.5 - 5.1 mEq/L   Chloride 102 96 - 112 mEq/L   CO2 27 19 - 32 mEq/L   Glucose, Bld 94 70 - 99 mg/dL   BUN 26 (H) 6 - 23 mg/dL   Creatinine, Ser 3.66 (H) 0.40 - 1.50 mg/dL   GFR 44.03 (L) >47.42 mL/min   Calcium 9.0 8.4 - 10.5 mg/dL  CBC with Differential/Platelet  Result Value Ref Range   WBC 8.7 4.0 - 10.5 K/uL   RBC 3.98 (L) 4.22 - 5.81 Mil/uL   Hemoglobin 13.1 13.0 - 17.0 g/dL   HCT 59.5 63.8 - 75.6 %   MCV 100.3 (H) 78.0 - 100.0 fl  MCHC 32.9 30.0 - 36.0 g/dL   RDW 40.9 81.1 - 91.4 %   Platelets 181.0 150.0 - 400.0 K/uL   Neutrophils Relative % 61.2 43.0 - 77.0 %   Lymphocytes Relative 28.9 12.0 - 46.0 %   Monocytes Relative 6.9 3.0 - 12.0 %   Eosinophils Relative 2.3 0.0 - 5.0 %   Basophils Relative 0.7 0.0 - 3.0 %   Neutro Abs 5.3 1.4 - 7.7 K/uL   Lymphs Abs 2.5 0.7 - 4.0 K/uL   Monocytes Absolute 0.6 0.1 - 1.0 K/uL   Eosinophils Absolute 0.2 0.0 - 0.7 K/uL   Basophils Absolute 0.1 0.0 - 0.1 K/uL        Assessment & Plan:   Problem List Items Addressed This Visit   None Visit Diagnoses     Toe infection    -  Primary   Relevant Medications   doxycycline (VIBRA-TABS) 100 MG tablet   Other Relevant Orders   Basic metabolic panel (Completed)   CBC with Differential/Platelet (Completed)   Ambulatory referral to Podiatry      Start antibiotics. Labs today. Referral to podiatry for infection.  We discussed patient goals of primarily supportive measures - lower extremity edema is chronic per patient. Declined extensive workup - basic labs today. Discussed elevation,  compression socks, etc.      Meds ordered this encounter  Medications   doxycycline (VIBRA-TABS) 100 MG tablet    Sig: Take 1 tablet (100 mg total) by mouth 2 (two) times daily for 10 days.    Dispense:  20 tablet    Refill:  0    Order Specific Question:   Supervising Provider    Answer:   Danise Edge A [[id number]]    Return if symptoms worsen or fail to improve.  Clayborne Dana, NP

## 2023-02-28 ENCOUNTER — Encounter: Payer: Self-pay | Admitting: Podiatry

## 2023-02-28 ENCOUNTER — Ambulatory Visit: Payer: Medicare HMO | Admitting: Podiatry

## 2023-02-28 DIAGNOSIS — M79674 Pain in right toe(s): Secondary | ICD-10-CM

## 2023-02-28 DIAGNOSIS — L97521 Non-pressure chronic ulcer of other part of left foot limited to breakdown of skin: Secondary | ICD-10-CM | POA: Diagnosis not present

## 2023-02-28 DIAGNOSIS — M79675 Pain in left toe(s): Secondary | ICD-10-CM | POA: Diagnosis not present

## 2023-02-28 DIAGNOSIS — B351 Tinea unguium: Secondary | ICD-10-CM | POA: Diagnosis not present

## 2023-03-01 NOTE — Progress Notes (Signed)
Subjective:   Patient ID: Justin Baker, male   DOB: 89 y.o.   MRN: [id number]   HPI Patient presents with several different problems with 1 being some nails that are severely elongated and thickened and secondarily he uses his wheelchair and uses his toes to get around is creating irritation around his toes and left over right that is local and was on doxycycline.  Patient does not smoke currently is not active   Review of Systems  All other systems reviewed and are negative.       Objective:  Physical Exam Vitals and nursing note reviewed.  Constitutional:      Appearance: He is well-developed.  Pulmonary:     Effort: Pulmonary effort is normal.  Musculoskeletal:        General: Normal range of motion.  Skin:    General: Skin is warm.  Neurological:     Mental Status: He is alert.     Neurovascular status was diminished bilateral with edema in the foot and leg secondary to immobilization with breakdown of tissue distal aspect hallux left over right with localized irritation of the tissue but no proximal edema erythema or drainage noted currently.  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  Treated nailbeds 1-5 both feet no angiogenic bleeding he will be seen back if any changes were to occur and understands risk of amputation as there but desperately would like to avoid that and it should heal uneventfully

## 2023-03-30 ENCOUNTER — Ambulatory Visit (INDEPENDENT_AMBULATORY_CARE_PROVIDER_SITE_OTHER): Payer: Medicare HMO

## 2023-03-30 DIAGNOSIS — I495 Sick sinus syndrome: Secondary | ICD-10-CM | POA: Diagnosis not present

## 2023-03-30 LAB — CUP PACEART REMOTE DEVICE CHECK
Battery Remaining Longevity: 14 mo
Battery Remaining Percentage: 14 %
Battery Voltage: 2.84 V
Brady Statistic AP VP Percent: 94 %
Brady Statistic AP VS Percent: 1 %
Brady Statistic AS VP Percent: 2.9 %
Brady Statistic AS VS Percent: 1 %
Brady Statistic RA Percent Paced: 35 %
Brady Statistic RV Percent Paced: 49 %
Date Time Interrogation Session: 20240807080609
Implantable Lead Connection Status: 753985
Implantable Lead Connection Status: 753985
Implantable Lead Implant Date: 20170322
Implantable Lead Implant Date: 20170322
Implantable Lead Location: 753859
Implantable Lead Location: 753860
Implantable Pulse Generator Implant Date: 20170322
Lead Channel Impedance Value: 390 Ohm
Lead Channel Impedance Value: 410 Ohm
Lead Channel Pacing Threshold Amplitude: 0.75 V
Lead Channel Pacing Threshold Amplitude: 0.75 V
Lead Channel Pacing Threshold Pulse Width: 0.5 ms
Lead Channel Pacing Threshold Pulse Width: 0.5 ms
Lead Channel Sensing Intrinsic Amplitude: 12 mV
Lead Channel Sensing Intrinsic Amplitude: 2.8 mV
Lead Channel Setting Pacing Amplitude: 2 V
Lead Channel Setting Pacing Amplitude: 2.5 V
Lead Channel Setting Pacing Pulse Width: 0.5 ms
Lead Channel Setting Sensing Sensitivity: 2 mV
Pulse Gen Model: 2272
Pulse Gen Serial Number: 3134370

## 2023-04-14 NOTE — Progress Notes (Signed)
Remote pacemaker transmission.   

## 2023-04-21 ENCOUNTER — Other Ambulatory Visit: Payer: Self-pay | Admitting: Family Medicine

## 2023-05-06 ENCOUNTER — Other Ambulatory Visit: Payer: Self-pay | Admitting: Cardiovascular Disease

## 2023-06-29 ENCOUNTER — Ambulatory Visit (INDEPENDENT_AMBULATORY_CARE_PROVIDER_SITE_OTHER): Payer: Medicare HMO

## 2023-06-29 DIAGNOSIS — I495 Sick sinus syndrome: Secondary | ICD-10-CM | POA: Diagnosis not present

## 2023-06-29 LAB — CUP PACEART REMOTE DEVICE CHECK
Battery Remaining Longevity: 12 mo
Battery Remaining Percentage: 12 %
Battery Voltage: 2.84 V
Brady Statistic AP VP Percent: 94 %
Brady Statistic AP VS Percent: 1 %
Brady Statistic AS VP Percent: 2.9 %
Brady Statistic AS VS Percent: 1 %
Brady Statistic RA Percent Paced: 31 %
Brady Statistic RV Percent Paced: 46 %
Date Time Interrogation Session: 20241106030014
Implantable Lead Connection Status: 753985
Implantable Lead Connection Status: 753985
Implantable Lead Implant Date: 20170322
Implantable Lead Implant Date: 20170322
Implantable Lead Location: 753859
Implantable Lead Location: 753860
Implantable Pulse Generator Implant Date: 20170322
Lead Channel Impedance Value: 390 Ohm
Lead Channel Impedance Value: 410 Ohm
Lead Channel Pacing Threshold Amplitude: 0.75 V
Lead Channel Pacing Threshold Amplitude: 0.75 V
Lead Channel Pacing Threshold Pulse Width: 0.5 ms
Lead Channel Pacing Threshold Pulse Width: 0.5 ms
Lead Channel Sensing Intrinsic Amplitude: 2.8 mV
Lead Channel Sensing Intrinsic Amplitude: 6.1 mV
Lead Channel Setting Pacing Amplitude: 2 V
Lead Channel Setting Pacing Amplitude: 2.5 V
Lead Channel Setting Pacing Pulse Width: 0.5 ms
Lead Channel Setting Sensing Sensitivity: 2 mV
Pulse Gen Model: 2272
Pulse Gen Serial Number: 3134370

## 2023-07-15 ENCOUNTER — Other Ambulatory Visit: Payer: Self-pay | Admitting: Cardiovascular Disease

## 2023-07-15 ENCOUNTER — Other Ambulatory Visit: Payer: Self-pay | Admitting: Family Medicine

## 2023-07-15 NOTE — Telephone Encounter (Signed)
Patient's son called back. Made patient an appointment to see Dr. Eden Emms in January.

## 2023-07-15 NOTE — Telephone Encounter (Signed)
Patient is suppose to be taking metoprolol 75 mg by mouth twice daily. Tried to call patient to see if he is still taking this dose and what his BP and HR are. Patient is over due for follow-up. Patient will need follow-up for future refills.

## 2023-07-19 NOTE — Progress Notes (Signed)
Remote pacemaker transmission.   

## 2023-08-08 ENCOUNTER — Telehealth: Payer: Self-pay | Admitting: Cardiovascular Disease

## 2023-08-08 NOTE — Telephone Encounter (Signed)
Spoke with patient's son who states he was returning a call from [facility] from Dr. Fabio Bering office.  Informed patient's son there are no recent calls documented and Dr. Fabio Bering nurse is not currently in the office.  Patient's son states he may have time of call confused and that he spoke with Elita Quick in November to schedule patient an appt to see Dr. Eden Emms in January 2025. Reviewed appt page and confirmed F/U visit was scheduled on 07/15/23. Patient's son states that may have been the call he was returning.  No further needs voiced. Will forward to Dr. Fabio Bering nurse to review in case she called about another matter.

## 2023-08-08 NOTE — Telephone Encounter (Signed)
Returning call to nurse °

## 2023-08-09 ENCOUNTER — Other Ambulatory Visit (HOSPITAL_COMMUNITY): Payer: Self-pay

## 2023-08-15 ENCOUNTER — Other Ambulatory Visit: Payer: Self-pay | Admitting: Cardiovascular Disease

## 2023-08-22 NOTE — Progress Notes (Deleted)
 Office Visit    Patient Name: Justin Baker Date of Encounter: 08/22/2023  Primary Care Provider:  Almarie Waddell NOVAK, NP Primary Cardiologist:  Maude Emmer, MD Primary Electrophysiologist: None  Chief Complaint    [name] is a 89 y.o. male with PMH of PAF, CVA, SSS s/p PPM 2017, subarachnoid hemorrhage who presents today for F/U  Past Medical History    Past Medical History:  Diagnosis Date   ARF (acute renal failure) (HCC) 05/16/2012   Atrial fibrillation (HCC)    Back pain    COPD (chronic obstructive pulmonary disease) (HCC)    self diagnosed   COVID 01/21/2021   Osteoarthritis    s/p right total hip   Pain in the chest    Shoulder pain, bilateral    Stroke (HCC)    Subdural hematoma (HCC)    Caused by fall   Tachy-brady syndrome (HCC)    a. s/p STJ dual chamber PPM    Thrombocytopenia (HCC)    Viral respiratory illness 07/09/2014   Past Surgical History:  Procedure Laterality Date   CARDIOVERSION  05/17/2012   Procedure: CARDIOVERSION;  Surgeon: Toribio JONELLE Fuel, MD;  Location: [facility] ENDOSCOPY;  Service: Cardiovascular;  Laterality: [name];   CATARACT EXTRACTION W/ INTRAOCULAR LENS IMPLANT     right   EP IMPLANTABLE DEVICE [name] 11/12/2015   SJM Assurity MRI DR PPM implanted by Dr Fernande for tachy/brady syndrome   PACEMAKER IMPLANT     TEE WITHOUT CARDIOVERSION  05/17/2012   Procedure: TRANSESOPHAGEAL ECHOCARDIOGRAM (TEE);  Surgeon: Toribio JONELLE Fuel, MD;  Location: [facility] ENDOSCOPY;  Service: Cardiovascular;  Laterality: [name];   TOTAL HIP ARTHROPLASTY     right    Allergies  No Known Allergies  History of Present Illness    Mr. Justin Baker was initially seen by me in 2013 for complaint of atrial fibrillation. TEE was done in hospital to R/O clot showed EF 40-45%, no MV or AV vegetation.  He was treated with amiodarone  and started on Eliquis  2.5 mg.  He was felt to be a high bleeding risk due to advanced age and high risk of falls.  His Eliquis  was discontinued  however in 05/2015 he was admitted to [facility] due to acute CVA due to embolism in the left middle cerebral artery.  He developed sick sinus syndrome and had PPM placed by Dr. Fernande in 2017.  His amiodarone  was discontinued in 2018 and he was admitted 11/2019 due to traumatic frontal lobe sulcus subarachnoid hemorrhage.  He was  seen by Dr. Kelsie in 07/2021 and patient had declined Watchman at that time with Paceart report showing AF burden of 8%.  He was admitted to the ED on 05/18/2022 due to complaint of productive cough and chills with greenish-brown sputum.  He was afebrile in the ED and became hypoxic requiring 2 L of O2 with chest x-ray showing mild atelectasis.  ABG to experience worsening confusion and increased somnolence.  CTA was negative for PE and CT of the head was negative for acute abnormalities.  He developed AF with RVR which improved with IV fluids and metoprolol .  His son looks in on him and is point of contract  He has been compliant with his metoprolol  and takes his Lasix  as needed for weight gain.  He is currently living at home with his son and reports that completes chair exercises daily.  He continues to have lower extremity edema was advised to use compression stockings and elevate legs when  possible.  ***  Home Medications    Current Outpatient Medications  Medication Sig Dispense Refill   atorvastatin  (LIPITOR) 40 MG tablet Take 1 tablet (40 mg total) by mouth daily. 90 tablet 3   furosemide  (LASIX ) 20 MG tablet TAKE 1 TABLET BY MOUTH AS NEEDED FOR WEIGHT GAIN/SWELLING 30 tablet 5   metoprolol  succinate (TOPROL -XL) 50 MG 24 hr tablet Take 1.5 tablets (75 mg total) by mouth daily. 135 tablet 0   metoprolol  tartrate (LOPRESSOR ) 25 MG tablet TAKE ONE TABLET BY MOUTH TWICE DAILY (IN ADDITION TO 50MG  TWICE A DAY) 60 tablet 1   Tiotropium Bromide  Monohydrate (SPIRIVA  RESPIMAT) 2.5 MCG/ACT AERS Inhale 2 puffs into the lungs daily. 4 g 10   No current  facility-administered medications for this visit.     Review of Systems  Please see the history of present illness.    (+) Lower extremity edema (+) Leg weakness  All other systems reviewed and are otherwise negative except as noted above.  Physical Exam    Wt Readings from Last 3 Encounters:  02/15/23 183 lb (83 kg)  08/19/22 201 lb (91.2 kg)  06/18/22 201 lb (91.2 kg)   CD:Uyzmz were no vitals filed for this visit.,There is no height or weight on file to calculate BMI.  Affect appropriate Very elderly white male  HEENT: normal Neck supple with no adenopathy JVP normal no bruits no thyromegaly Lungs clear with no wheezing and good diaphragmatic motion Heart:  S1/S2 no murmur, no rub, gallop or click PMI normal  PPM under left clavicle  Abdomen: benighn, BS positve, no tenderness, no AAA no bruit.  No HSM or HJR Distal pulses intact with no bruits Plus one bilateral edema Neuro non-focal Skin warm and dry No muscular weakness   EKG/LABS/Other Studies Reviewed    ECG personally reviewed by me today - afib rate 118 nonspecific ST changes with paired PVC;s  05/18/22   ***   Lab Results  Component Value Date   WBC 8.7 02/15/2023   HGB 13.1 02/15/2023   HCT 39.9 02/15/2023   MCV 100.3 (H) 02/15/2023   PLT 181.0 02/15/2023   Lab Results  Component Value Date   CREATININE 1.95 (H) 02/15/2023   BUN 26 (H) 02/15/2023   NA 139 02/15/2023   K 3.7 02/15/2023   CL 102 02/15/2023   CO2 27 02/15/2023   Lab Results  Component Value Date   ALT 9 08/19/2022   AST 14 08/19/2022   ALKPHOS 57 08/19/2022   BILITOT 1.4 (H) 08/19/2022   Lab Results  Component Value Date   CHOL 92 (L) 07/27/2018   HDL 35 (L) 07/27/2018   LDLCALC 40 07/27/2018   TRIG 86 07/27/2018   CHOLHDL 2.6 07/27/2018    Lab Results  Component Value Date   HGBA1C 5.6 05/15/2012    Assessment & Plan    1.  Persistent atrial fibrillation: -rate control with metoprolol   -He is currently not on  anticoagulation due to history of subdural hemorrhage and high fall risk.  2.  History of sick sinus syndrome: -s/p PPM placed 2017 by Dr. Fernande -Paceart report with normal device function - mode switch 66% with LRL 60 bpm and max tracking 110 bpm  3.  Lower extremity edema: -Today patient reports continued swelling that is relieved with Lasix . -He was instructed to elevate extremities when dependent.  4.  Secondary hypercoagulable state: -Patient is at increased risk of stroke due to CHA2DS2-VASc score of 4 however not a  candidate for anticoagulation due to high fall risk and history of subdural hemorrhage and age being near 43    Disposition: Follow-up with [facility] 6 months for PPM and me in a year    Medication Adjustments/Labs and Tests Ordered: Current medicines are reviewed at length with the patient today.  Concerns regarding medicines are outlined above.   Signed, Maude Emmer,  MD [facility]

## 2023-08-25 ENCOUNTER — Other Ambulatory Visit (HOSPITAL_COMMUNITY): Payer: Self-pay

## 2023-08-25 MED ORDER — METOPROLOL TARTRATE 50 MG PO TABS: 50.0000 mg | ORAL_TABLET | Freq: Two times a day (BID) | ORAL | 3 refills | Status: DC

## 2023-08-30 ENCOUNTER — Other Ambulatory Visit (HOSPITAL_COMMUNITY): Payer: Self-pay

## 2023-08-31 ENCOUNTER — Ambulatory Visit: Payer: Medicare HMO | Admitting: Cardiovascular Disease

## 2023-09-05 ENCOUNTER — Other Ambulatory Visit (HOSPITAL_COMMUNITY): Payer: Self-pay

## 2023-09-05 ENCOUNTER — Other Ambulatory Visit: Payer: Self-pay

## 2023-09-06 ENCOUNTER — Other Ambulatory Visit (HOSPITAL_COMMUNITY): Payer: Self-pay

## 2023-09-06 ENCOUNTER — Other Ambulatory Visit: Payer: Self-pay

## 2023-09-07 ENCOUNTER — Other Ambulatory Visit: Payer: Self-pay

## 2023-09-07 ENCOUNTER — Other Ambulatory Visit (HOSPITAL_COMMUNITY): Payer: Self-pay

## 2023-09-07 MED FILL — Metoprolol Tartrate Tab 25 MG: ORAL | 30 days supply | Qty: 60 | Fill #0 | Status: CN

## 2023-09-09 ENCOUNTER — Telehealth: Payer: Self-pay | Admitting: Cardiovascular Disease

## 2023-09-09 MED ORDER — METOPROLOL TARTRATE 50 MG PO TABS: 50.0000 mg | ORAL_TABLET | Freq: Two times a day (BID) | ORAL | 0 refills | Status: DC

## 2023-09-09 NOTE — Telephone Encounter (Signed)
*  STAT* If patient is at the pharmacy, call can be transferred to refill team.   1. Which medications need to be refilled? (please list name of each medication and dose if known)  metoprolol tartrate (LOPRESSOR) 50 MG tablet  2. Which pharmacy/location (including street and city if local pharmacy) is medication to be sent to? [facility] - [address] [place], [place] - [address] Phone: [phone]  Fax: [phone]   3. Do they need a 30 day or 90 day supply? 90

## 2023-09-09 NOTE — Telephone Encounter (Signed)
Pt's medication was sent to pt's pharmacy as requested. Confirmation received.  °

## 2023-09-13 ENCOUNTER — Other Ambulatory Visit: Payer: Self-pay | Admitting: Family Medicine

## 2023-09-13 NOTE — Telephone Encounter (Signed)
Copied from CRM [id number]. Topic: Clinical - Medication Refill >> Sep 13, 2023  4:37 PM Denese Killings wrote: Most Recent Primary Care Visit:  Provider: Clayborne Dana  Department: [facility]  Visit Type: OFFICE VISIT  Date: 02/15/2023  Medication: ***  Has the patient contacted their pharmacy?  (Agent: If no, request that the patient contact the pharmacy for the refill. If patient does not wish to contact the pharmacy document the reason why and proceed with request.) (Agent: If yes, when and what did the pharmacy advise?)  Is this the correct pharmacy for this prescription?  If no, delete pharmacy and type the correct one.  This is the patient's preferred pharmacy:  [facility] [place], [place] - [address] [place] [address] Phone: [phone] Fax: [phone]  [facility] [address] [place], [place] - [address] [place] [address] Phone: [phone] Fax: [phone]   Has the prescription been filled recently?   Is the patient out of the medication?   Has the patient been seen for an appointment in the last year OR does the patient have an upcoming appointment?   Can we respond through MyChart?   Agent: Please be advised that Rx refills may take up to 3 business days. We ask that you follow-up with your pharmacy.

## 2023-09-27 ENCOUNTER — Encounter: Payer: Self-pay | Admitting: Family Medicine

## 2023-09-28 ENCOUNTER — Ambulatory Visit (INDEPENDENT_AMBULATORY_CARE_PROVIDER_SITE_OTHER): Payer: Medicare HMO

## 2023-09-28 DIAGNOSIS — I495 Sick sinus syndrome: Secondary | ICD-10-CM | POA: Diagnosis not present

## 2023-09-29 LAB — CUP PACEART REMOTE DEVICE CHECK
Battery Remaining Longevity: 9 mo
Battery Remaining Percentage: 9 %
Battery Voltage: 2.81 V
Brady Statistic AP VP Percent: 94 %
Brady Statistic AP VS Percent: 1 %
Brady Statistic AS VP Percent: 2.9 %
Brady Statistic AS VS Percent: 1 %
Brady Statistic RA Percent Paced: 32 %
Brady Statistic RV Percent Paced: 47 %
Date Time Interrogation Session: 20250205083204
Implantable Lead Connection Status: 753985
Implantable Lead Connection Status: 753985
Implantable Lead Implant Date: 20170322
Implantable Lead Implant Date: 20170322
Implantable Lead Location: 753859
Implantable Lead Location: 753860
Implantable Pulse Generator Implant Date: 20170322
Lead Channel Impedance Value: 360 Ohm
Lead Channel Impedance Value: 400 Ohm
Lead Channel Pacing Threshold Amplitude: 0.75 V
Lead Channel Pacing Threshold Amplitude: 0.75 V
Lead Channel Pacing Threshold Pulse Width: 0.5 ms
Lead Channel Pacing Threshold Pulse Width: 0.5 ms
Lead Channel Sensing Intrinsic Amplitude: 1.5 mV
Lead Channel Sensing Intrinsic Amplitude: 8.5 mV
Lead Channel Setting Pacing Amplitude: 2 V
Lead Channel Setting Pacing Amplitude: 2.5 V
Lead Channel Setting Pacing Pulse Width: 0.5 ms
Lead Channel Setting Sensing Sensitivity: 2 mV
Pulse Gen Model: 2272
Pulse Gen Serial Number: 3134370

## 2023-10-03 ENCOUNTER — Telehealth: Payer: Self-pay | Admitting: Cardiovascular Disease

## 2023-10-03 MED ORDER — ATORVASTATIN CALCIUM 40 MG PO TABS: 40.0000 mg | ORAL_TABLET | Freq: Every day | ORAL | 0 refills | Status: DC

## 2023-10-03 NOTE — Telephone Encounter (Signed)
*  STAT* If patient is at the pharmacy, call can be transferred to refill team.   1. Which medications need to be refilled? (please list name of each medication and dose if known)   atorvastatin  (LIPITOR) 40 MG tablet   2. Would you like to learn more about the convenience, safety, & potential cost savings by using the [facility]?   3. Are you open to using the [facility] (Type [facility]. ).  4. Which pharmacy/location (including street and city if local pharmacy) is medication to be sent to?  [facility] - [place], [place] - [address]   5. Do they need a 30 day or 90 day supply?   30 day  Son Lavonia Powers) stated patient has enough medication for this week.

## 2023-10-11 NOTE — Progress Notes (Signed)
 Office Visit    Patient Name: Justin Baker Date of Encounter: 10/19/2023  Primary Care Provider:  Clayborne Dana, NP Primary Cardiologist:  [name], MD Primary Electrophysiologist: None  Chief Complaint    Justin Baker is a 89 y.o. male with PMH of PAF, CVA, SSS s/p PPM 2017, subarachnoid hemorrhage who presents today for f/u of his PAF I have not seen him since 2022   Past Medical History    Past Medical History:  Diagnosis Date   ARF (acute renal failure) (HCC) 05/16/2012   Atrial fibrillation (HCC)    Back pain    COPD (chronic obstructive pulmonary disease) (HCC)    self diagnosed   COVID 01/21/2021   Osteoarthritis    s/p right total hip   Pain in the chest    Shoulder pain, bilateral    Stroke (HCC)    Subdural hematoma (HCC)    Caused by fall   Tachy-brady syndrome (HCC)    a. s/p STJ dual chamber PPM    Thrombocytopenia (HCC)    Viral respiratory illness 07/09/2014   Past Surgical History:  Procedure Laterality Date   CARDIOVERSION  05/17/2012   Procedure: CARDIOVERSION;  Surgeon: Dolores Patty, MD;  Location: [facility] ENDOSCOPY;  Service: Cardiovascular;  Laterality: [name];   CATARACT EXTRACTION W/ INTRAOCULAR LENS IMPLANT     right   EP IMPLANTABLE DEVICE [name] 11/12/2015   SJM Assurity MRI DR PPM implanted by Dr Graciela Husbands for tachy/brady syndrome   PACEMAKER IMPLANT     TEE WITHOUT CARDIOVERSION  05/17/2012   Procedure: TRANSESOPHAGEAL ECHOCARDIOGRAM (TEE);  Surgeon: Dolores Patty, MD;  Location: [facility] ENDOSCOPY;  Service: Cardiovascular;  Laterality: [name];   TOTAL HIP ARTHROPLASTY     right    Allergies  No Known Allergies  History of Present Illness    Justin Baker  is a 89 year old male with the above mention past medical history who presents today for follow-up of atrial fibrillation with RVR following sepsis due to possible diverticulitis.  Justin Baker was initially seen by me in 2013 for complaint of atrial fibrillation. TEE was done in  hospital to R/O clot showed EF 40-45%, no MV or AV vegetation.  He was treated with amiodarone and started on Eliquis 2.5 mg.  He was felt to be a high bleeding risk due to advanced age and high risk of falls.  His Eliquis was discontinued however in 05/2015 he was admitted to [facility] hospital due to acute CVA due to embolism in the left middle cerebral artery.  He developed sick sinus syndrome and had PPM placed by Dr. Graciela Husbands in 2017.  His amiodarone was discontinued in 2018 and he was admitted 11/2019 due to traumatic frontal lobe sulcus subarachnoid hemorrhage.  He was last seen by Dr. Johney Frame in 07/2021 and patient had declined Watchman at that time with Paceart report showing AF burden of 8%.  He was admitted to the ED on 05/18/2022 due to complaint of productive cough and chills with greenish-brown sputum.  He was afebrile in the ED and became hypoxic requiring 2 L of O2 with chest x-ray showing mild atelectasis.  ABG to experience worsening confusion and increased somnolence.  CTA was negative for PE and CT of the head was negative for acute abnormalities.  He developed AF with RVR which improved with IV fluids and metoprolol.  Justin Baker presents today with his son.  No recurrence of tachycardia or dizziness.  He has been compliant  with his metoprolol and takes his Lasix as needed for weight gain.  He is currently living at home with his son and reports that completes chair exercises daily.    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 Married 72 years. He urinates a lot at night when his legs are elevated Discussed taking his lasix in am but he doesn't like to. Use to ride a Harley pan head and shovel head. Son with him today also rides and was a captain in [employer]  Home Medications    Current Outpatient Medications  Medication Sig Dispense Refill   atorvastatin (LIPITOR) 40 MG tablet Take 1 tablet (40 mg total)  by mouth daily. 90 tablet 0   metoprolol tartrate (LOPRESSOR) 25 MG tablet TAKE 1 TABLET BY MOUTH TWICE DAILY IN  ADDITION  TO  50MG   TWICE  A  DAY 60 tablet 0   metoprolol tartrate (LOPRESSOR) 50 MG tablet Take 1 tablet (50 mg total) by mouth 2 (two) times daily. 180 tablet 0   Tiotropium Bromide Monohydrate (SPIRIVA RESPIMAT) 2.5 MCG/ACT AERS Inhale 2 puffs into the lungs daily. 4 g 10   furosemide (LASIX) 20 MG tablet Take 1 tablet (20 mg total) by mouth as needed for weight gain/swelling. (Patient not taking: Reported on 10/19/2023) 30 tablet 5   metoprolol succinate (TOPROL-XL) 50 MG 24 hr tablet Take 1.5 tablets (75 mg total) by mouth daily. (Patient not taking: Reported on 10/19/2023) 135 tablet 0   No current facility-administered medications for this visit.     Review of Systems  Please see the history of present illness.    (+) Lower extremity edema (+) Leg weakness  All other systems reviewed and are otherwise negative except as noted above.  Physical Exam    Wt Readings from Last 3 Encounters:  10/19/23 192 lb 9.6 oz (87.4 kg)  02/15/23 183 lb (83 kg)  08/19/22 201 lb (91.2 kg)   VS: Vitals:   10/19/23 1354  BP: (!) 142/68  Pulse: 64  SpO2: 98%  ,Body mass index is 28.44 kg/m.  Affect appropriate Elderly male  HEENT: normal Neck supple with no adenopathy JVP normal no bruits no thyromegaly Lungs clear with no wheezing and good diaphragmatic motion Heart:  S1/S2 no murmur, no rub, gallop or click PMI normal Abdomen: benighn, BS positve, no tenderness, no AAA no bruit.  No HSM or HJR Distal pulses intact with no bruits Plus 2 bilateral  edema Neuro non-focal Skin warm and dry No muscular weakness  EKG/LABS/Other Studies Reviewed    AV pacing  10/19/2023   Lab Results  Component Value Date   WBC 8.7 02/15/2023   HGB 13.1 02/15/2023   HCT 39.9 02/15/2023   MCV 100.3 (H) 02/15/2023   PLT 181.0 02/15/2023   Lab Results  Component Value Date    CREATININE 1.95 (H) 02/15/2023   BUN 26 (H) 02/15/2023   NA 139 02/15/2023   K 3.7 02/15/2023   CL 102 02/15/2023   CO2 27 02/15/2023   Lab Results  Component Value Date   ALT 9 08/19/2022   AST 14 08/19/2022   ALKPHOS 57 08/19/2022   BILITOT 1.4 (H) 08/19/2022   Lab Results  Component Value Date   CHOL 92 (L) 07/27/2018   HDL 35 (L) 07/27/2018   LDLCALC 40 07/27/2018   TRIG 86 07/27/2018   CHOLHDL 2.6 07/27/2018    Lab  Results  Component Value Date   HGBA1C 5.6 05/15/2012    Assessment & Plan    1.  Persistent atrial fibrillation: -Patient had 05/25/22 hospitalization due to sepsis and developed AF with RVR that resolved with IV fluids and metoprolol. -Today patient reports rate controlled and per auscultation regular rhythm ECG with AV pacing  -He is currently not on anticoagulation due to history of subdural hemorrhage and high fall risk.  2.  History of sick sinus syndrome: -s/p PPM placed 2017 by Dr. Graciela Husbands -Paceart report with normal device function 09/29/23   3.  Lower extremity edema: -Encouraged him to take his lasix to prevent LE weeping -He was instructed to elevate extremities when dependent. - has compression stockings on   4.  Secondary hypercoagulable state: -Patient is at increased risk of stroke due to CHA2DS2-VASc score of 4 however not a candidate for anticoagulation due to high fall risk and history of subdural hemorrhage  5.  PPM: - normal function on PACEART - ECG AV pacing today  Disposition: Follow-up with [name], MD  in a year

## 2023-10-14 ENCOUNTER — Other Ambulatory Visit: Payer: Self-pay | Admitting: Cardiovascular Disease

## 2023-10-19 ENCOUNTER — Encounter: Payer: Self-pay | Admitting: Cardiovascular Disease

## 2023-10-19 ENCOUNTER — Ambulatory Visit: Payer: Self-pay | Attending: Cardiovascular Disease | Admitting: Cardiovascular Disease

## 2023-10-19 VITALS — BP 142/68 | HR 64 | Ht 69.0 in | Wt 192.6 lb

## 2023-10-19 DIAGNOSIS — Z09 Encounter for follow-up examination after completed treatment for conditions other than malignant neoplasm: Secondary | ICD-10-CM

## 2023-10-19 DIAGNOSIS — I4891 Unspecified atrial fibrillation: Secondary | ICD-10-CM | POA: Diagnosis not present

## 2023-10-19 DIAGNOSIS — Z950 Presence of cardiac pacemaker: Secondary | ICD-10-CM

## 2023-10-19 DIAGNOSIS — I495 Sick sinus syndrome: Secondary | ICD-10-CM

## 2023-10-19 DIAGNOSIS — R600 Localized edema: Secondary | ICD-10-CM | POA: Diagnosis not present

## 2023-10-19 DIAGNOSIS — D6869 Other thrombophilia: Secondary | ICD-10-CM

## 2023-10-19 NOTE — Patient Instructions (Signed)
 Medication Instructions:  Your physician recommends that you continue on your current medications as directed. Please refer to the Current Medication list given to you today.  *If you need a refill on your cardiac medications before your next appointment, please call your pharmacy*  Lab Work: If you have labs (blood work) drawn today and your tests are completely normal, you will receive your results only by: MyChart Message (if you have MyChart) OR A paper copy in the mail If you have any lab test that is abnormal or we need to change your treatment, we will call you to review the results.  Testing/Procedures: None ordered today.  Follow-Up: At [facility], you and your health needs are our priority.  As part of our continuing mission to provide you with exceptional heart care, we have created designated Provider Care Teams.  These Care Teams include your primary Cardiologist (physician) and Advanced Practice Providers (APPs -  Physician Assistants and Nurse Practitioners) who all work together to provide you with the care you need, when you need it.  We recommend signing up for the patient portal called "MyChart".  Sign up information is provided on this After Visit Summary.  MyChart is used to connect with patients for Virtual Visits (Telemedicine).  Patients are able to view lab/test results, encounter notes, upcoming appointments, etc.  Non-urgent messages can be sent to your provider as well.   To learn more about what you can do with MyChart, go to [email or web address].    Your next appointment:   1 year(s)  Provider:   [name], MD or NP/PA    Other Instructions       1st Floor: - Lobby - Registration  - Pharmacy  - Lab - Cafe  2nd Floor: - PV Lab - Diagnostic Testing (echo, CT, nuclear med)  3rd Floor: - Vacant  4th Floor: - TCTS (cardiothoracic surgery) - AFib Clinic - Structural Heart Clinic - Vascular Surgery  - Vascular Ultrasound  5th  Floor: - [facility] (general and EP) - Clinical Pharmacy for coumadin, hypertension, lipid, weight-loss medications, and med management appointments    Valet parking services will be available as well.

## 2023-10-31 ENCOUNTER — Other Ambulatory Visit: Payer: Self-pay | Admitting: Family Medicine

## 2023-10-31 DIAGNOSIS — J449 Chronic obstructive pulmonary disease, unspecified: Secondary | ICD-10-CM

## 2023-11-03 NOTE — Progress Notes (Signed)
 Remote pacemaker transmission.

## 2023-11-03 NOTE — Addendum Note (Signed)
 Addended by: Elease Etienne A on: 11/03/2023 03:52 PM   Modules accepted: Orders

## 2023-11-06 ENCOUNTER — Other Ambulatory Visit: Payer: Self-pay | Admitting: Cardiovascular Disease

## 2023-11-07 DIAGNOSIS — J439 Emphysema, unspecified: Secondary | ICD-10-CM | POA: Diagnosis not present

## 2023-11-07 DIAGNOSIS — I5033 Acute on chronic diastolic (congestive) heart failure: Secondary | ICD-10-CM | POA: Diagnosis not present

## 2023-11-07 DIAGNOSIS — K5792 Diverticulitis of intestine, part unspecified, without perforation or abscess without bleeding: Secondary | ICD-10-CM | POA: Diagnosis not present

## 2023-11-07 DIAGNOSIS — I509 Heart failure, unspecified: Secondary | ICD-10-CM | POA: Diagnosis not present

## 2023-11-07 DIAGNOSIS — Z950 Presence of cardiac pacemaker: Secondary | ICD-10-CM | POA: Diagnosis not present

## 2023-11-07 DIAGNOSIS — E538 Deficiency of other specified B group vitamins: Secondary | ICD-10-CM | POA: Diagnosis not present

## 2023-11-07 DIAGNOSIS — I130 Hypertensive heart and chronic kidney disease with heart failure and stage 1 through stage 4 chronic kidney disease, or unspecified chronic kidney disease: Secondary | ICD-10-CM | POA: Diagnosis not present

## 2023-11-07 DIAGNOSIS — D696 Thrombocytopenia, unspecified: Secondary | ICD-10-CM | POA: Diagnosis not present

## 2023-11-07 DIAGNOSIS — D539 Nutritional anemia, unspecified: Secondary | ICD-10-CM | POA: Diagnosis not present

## 2023-11-07 DIAGNOSIS — R531 Weakness: Secondary | ICD-10-CM | POA: Diagnosis not present

## 2023-11-07 DIAGNOSIS — N390 Urinary tract infection, site not specified: Secondary | ICD-10-CM | POA: Diagnosis not present

## 2023-11-07 DIAGNOSIS — R269 Unspecified abnormalities of gait and mobility: Secondary | ICD-10-CM | POA: Diagnosis not present

## 2023-11-07 DIAGNOSIS — I959 Hypotension, unspecified: Secondary | ICD-10-CM | POA: Diagnosis not present

## 2023-11-07 DIAGNOSIS — N3001 Acute cystitis with hematuria: Secondary | ICD-10-CM | POA: Diagnosis not present

## 2023-11-07 DIAGNOSIS — I480 Paroxysmal atrial fibrillation: Secondary | ICD-10-CM | POA: Diagnosis not present

## 2023-11-07 DIAGNOSIS — Z79899 Other long term (current) drug therapy: Secondary | ICD-10-CM | POA: Diagnosis not present

## 2023-11-07 DIAGNOSIS — R600 Localized edema: Secondary | ICD-10-CM | POA: Diagnosis not present

## 2023-11-07 DIAGNOSIS — Z8709 Personal history of other diseases of the respiratory system: Secondary | ICD-10-CM | POA: Diagnosis not present

## 2023-11-07 DIAGNOSIS — Z96641 Presence of right artificial hip joint: Secondary | ICD-10-CM | POA: Diagnosis not present

## 2023-11-07 DIAGNOSIS — I495 Sick sinus syndrome: Secondary | ICD-10-CM | POA: Diagnosis not present

## 2023-11-07 DIAGNOSIS — I609 Nontraumatic subarachnoid hemorrhage, unspecified: Secondary | ICD-10-CM | POA: Diagnosis not present

## 2023-11-07 DIAGNOSIS — N1832 Chronic kidney disease, stage 3b: Secondary | ICD-10-CM | POA: Diagnosis not present

## 2023-11-07 DIAGNOSIS — R918 Other nonspecific abnormal finding of lung field: Secondary | ICD-10-CM | POA: Diagnosis not present

## 2023-11-07 DIAGNOSIS — Z8673 Personal history of transient ischemic attack (TIA), and cerebral infarction without residual deficits: Secondary | ICD-10-CM | POA: Diagnosis not present

## 2023-11-07 DIAGNOSIS — I10 Essential (primary) hypertension: Secondary | ICD-10-CM | POA: Diagnosis not present

## 2023-11-07 DIAGNOSIS — Z87891 Personal history of nicotine dependence: Secondary | ICD-10-CM | POA: Diagnosis not present

## 2023-11-07 DIAGNOSIS — I4891 Unspecified atrial fibrillation: Secondary | ICD-10-CM | POA: Diagnosis not present

## 2023-11-07 DIAGNOSIS — G8929 Other chronic pain: Secondary | ICD-10-CM | POA: Diagnosis not present

## 2023-11-07 DIAGNOSIS — R54 Age-related physical debility: Secondary | ICD-10-CM | POA: Diagnosis not present

## 2023-11-07 DIAGNOSIS — J449 Chronic obstructive pulmonary disease, unspecified: Secondary | ICD-10-CM | POA: Diagnosis not present

## 2023-11-07 DIAGNOSIS — B957 Other staphylococcus as the cause of diseases classified elsewhere: Secondary | ICD-10-CM | POA: Diagnosis not present

## 2023-11-07 DIAGNOSIS — Z9181 History of falling: Secondary | ICD-10-CM | POA: Diagnosis not present

## 2023-11-07 DIAGNOSIS — E785 Hyperlipidemia, unspecified: Secondary | ICD-10-CM | POA: Diagnosis not present

## 2023-11-07 DIAGNOSIS — Z743 Need for continuous supervision: Secondary | ICD-10-CM | POA: Diagnosis not present

## 2023-11-07 DIAGNOSIS — Z91148 Patient's other noncompliance with medication regimen for other reason: Secondary | ICD-10-CM | POA: Diagnosis not present

## 2023-11-07 DIAGNOSIS — R339 Retention of urine, unspecified: Secondary | ICD-10-CM | POA: Diagnosis not present

## 2023-11-07 DIAGNOSIS — N179 Acute kidney failure, unspecified: Secondary | ICD-10-CM | POA: Diagnosis not present

## 2023-11-07 DIAGNOSIS — D518 Other vitamin B12 deficiency anemias: Secondary | ICD-10-CM | POA: Diagnosis not present

## 2023-11-07 DIAGNOSIS — Z836 Family history of other diseases of the respiratory system: Secondary | ICD-10-CM | POA: Diagnosis not present

## 2023-11-07 DIAGNOSIS — N3000 Acute cystitis without hematuria: Secondary | ICD-10-CM | POA: Diagnosis not present

## 2023-11-07 DIAGNOSIS — D7589 Other specified diseases of blood and blood-forming organs: Secondary | ICD-10-CM | POA: Diagnosis not present

## 2023-11-07 DIAGNOSIS — Z66 Do not resuscitate: Secondary | ICD-10-CM | POA: Diagnosis not present

## 2023-11-11 DIAGNOSIS — K5792 Diverticulitis of intestine, part unspecified, without perforation or abscess without bleeding: Secondary | ICD-10-CM | POA: Diagnosis not present

## 2023-11-11 DIAGNOSIS — R600 Localized edema: Secondary | ICD-10-CM | POA: Diagnosis not present

## 2023-11-11 DIAGNOSIS — G8929 Other chronic pain: Secondary | ICD-10-CM | POA: Diagnosis not present

## 2023-11-11 DIAGNOSIS — D7589 Other specified diseases of blood and blood-forming organs: Secondary | ICD-10-CM | POA: Diagnosis not present

## 2023-11-11 DIAGNOSIS — J439 Emphysema, unspecified: Secondary | ICD-10-CM | POA: Diagnosis not present

## 2023-11-11 DIAGNOSIS — Z743 Need for continuous supervision: Secondary | ICD-10-CM | POA: Diagnosis not present

## 2023-11-11 DIAGNOSIS — R269 Unspecified abnormalities of gait and mobility: Secondary | ICD-10-CM | POA: Diagnosis not present

## 2023-11-11 DIAGNOSIS — I10 Essential (primary) hypertension: Secondary | ICD-10-CM | POA: Diagnosis not present

## 2023-11-11 DIAGNOSIS — J449 Chronic obstructive pulmonary disease, unspecified: Secondary | ICD-10-CM | POA: Diagnosis not present

## 2023-11-11 DIAGNOSIS — Z950 Presence of cardiac pacemaker: Secondary | ICD-10-CM | POA: Diagnosis not present

## 2023-11-11 DIAGNOSIS — R54 Age-related physical debility: Secondary | ICD-10-CM | POA: Diagnosis not present

## 2023-11-11 DIAGNOSIS — I609 Nontraumatic subarachnoid hemorrhage, unspecified: Secondary | ICD-10-CM | POA: Diagnosis not present

## 2023-11-11 DIAGNOSIS — E538 Deficiency of other specified B group vitamins: Secondary | ICD-10-CM | POA: Diagnosis not present

## 2023-11-11 DIAGNOSIS — N179 Acute kidney failure, unspecified: Secondary | ICD-10-CM | POA: Diagnosis not present

## 2023-11-11 DIAGNOSIS — I480 Paroxysmal atrial fibrillation: Secondary | ICD-10-CM | POA: Diagnosis not present

## 2023-11-11 DIAGNOSIS — R531 Weakness: Secondary | ICD-10-CM | POA: Diagnosis not present

## 2023-11-11 DIAGNOSIS — Z8673 Personal history of transient ischemic attack (TIA), and cerebral infarction without residual deficits: Secondary | ICD-10-CM | POA: Diagnosis not present

## 2023-11-11 DIAGNOSIS — N390 Urinary tract infection, site not specified: Secondary | ICD-10-CM | POA: Diagnosis not present

## 2023-11-11 DIAGNOSIS — R260 Ataxic gait: Secondary | ICD-10-CM | POA: Diagnosis not present

## 2023-11-11 DIAGNOSIS — I5033 Acute on chronic diastolic (congestive) heart failure: Secondary | ICD-10-CM | POA: Diagnosis not present

## 2023-11-11 DIAGNOSIS — N1832 Chronic kidney disease, stage 3b: Secondary | ICD-10-CM | POA: Diagnosis not present

## 2023-11-14 DIAGNOSIS — R54 Age-related physical debility: Secondary | ICD-10-CM | POA: Diagnosis not present

## 2023-11-14 DIAGNOSIS — I5033 Acute on chronic diastolic (congestive) heart failure: Secondary | ICD-10-CM | POA: Diagnosis not present

## 2023-11-14 DIAGNOSIS — R600 Localized edema: Secondary | ICD-10-CM | POA: Diagnosis not present

## 2023-11-14 DIAGNOSIS — R260 Ataxic gait: Secondary | ICD-10-CM | POA: Diagnosis not present

## 2023-12-28 ENCOUNTER — Other Ambulatory Visit: Payer: Self-pay | Admitting: Cardiovascular Disease

## 2023-12-28 ENCOUNTER — Ambulatory Visit (INDEPENDENT_AMBULATORY_CARE_PROVIDER_SITE_OTHER): Payer: Medicare HMO

## 2023-12-28 ENCOUNTER — Telehealth: Payer: Self-pay

## 2023-12-28 DIAGNOSIS — I495 Sick sinus syndrome: Secondary | ICD-10-CM | POA: Diagnosis not present

## 2023-12-28 LAB — CUP PACEART REMOTE DEVICE CHECK
Battery Remaining Longevity: 6 mo
Battery Remaining Percentage: 6 %
Battery Voltage: 2.77 V
Brady Statistic AP VP Percent: 93 %
Brady Statistic AP VS Percent: 1 %
Brady Statistic AS VP Percent: 3.4 %
Brady Statistic AS VS Percent: 1 %
Brady Statistic RA Percent Paced: 34 %
Brady Statistic RV Percent Paced: 48 %
Date Time Interrogation Session: 20250507040014
Implantable Lead Connection Status: 753985
Implantable Lead Connection Status: 753985
Implantable Lead Implant Date: 20170322
Implantable Lead Implant Date: 20170322
Implantable Lead Location: 753859
Implantable Lead Location: 753860
Implantable Pulse Generator Implant Date: 20170322
Lead Channel Impedance Value: 360 Ohm
Lead Channel Impedance Value: 410 Ohm
Lead Channel Pacing Threshold Amplitude: 0.75 V
Lead Channel Pacing Threshold Amplitude: 0.75 V
Lead Channel Pacing Threshold Pulse Width: 0.5 ms
Lead Channel Pacing Threshold Pulse Width: 0.5 ms
Lead Channel Sensing Intrinsic Amplitude: 3.1 mV
Lead Channel Sensing Intrinsic Amplitude: 5 mV
Lead Channel Setting Pacing Amplitude: 2 V
Lead Channel Setting Pacing Amplitude: 2.5 V
Lead Channel Setting Pacing Pulse Width: 0.5 ms
Lead Channel Setting Sensing Sensitivity: 2 mV
Pulse Gen Model: 2272
Pulse Gen Serial Number: 3134370

## 2023-12-28 NOTE — Telephone Encounter (Signed)
 Patient has not been seen since 2022.  Needs to be set up with Dr. Lawana Pray (or APP) within next month for follow up.   Scheduled remote transmission - AF burden with known hx , OAC contraindicated.  Battery at to ERI. Set up on monthly battery transmissions. I left message for patient letting him know that we were initiating monthly checks and someone would be reaching back out to set him up with Dr. Lawana Pray who is taking over for Dr. Rodolfo Clan as he is retiring this year.

## 2024-01-09 ENCOUNTER — Ambulatory Visit: Payer: Self-pay | Admitting: Cardiology

## 2024-01-30 ENCOUNTER — Encounter: Payer: Self-pay | Admitting: Cardiology

## 2024-01-30 ENCOUNTER — Ambulatory Visit: Attending: Cardiology | Admitting: Cardiology

## 2024-01-30 VITALS — BP 116/69 | HR 62 | Ht 69.0 in | Wt 195.0 lb

## 2024-01-30 DIAGNOSIS — D6869 Other thrombophilia: Secondary | ICD-10-CM | POA: Diagnosis not present

## 2024-01-30 DIAGNOSIS — I480 Paroxysmal atrial fibrillation: Secondary | ICD-10-CM | POA: Diagnosis not present

## 2024-01-30 DIAGNOSIS — I495 Sick sinus syndrome: Secondary | ICD-10-CM | POA: Diagnosis not present

## 2024-01-30 NOTE — Progress Notes (Signed)
  Electrophysiology Office Note:   Date:  01/30/2024  ID:  Justin Baker, DOB 07/24/1922, MRN [id number]  Primary Cardiologist: Janelle Mediate, MD Primary Heart Failure: None Electrophysiologist: Zhuri Krass Cortland Ding, MD      History of Present Illness:   Justin Baker is a 89 y.o. male with h/o atrial fibrillation, COPD, CVA, tachybradycardia syndrome, thrombocytopenia seen today for routine electrophysiology followup.   Since last being seen in our clinic the patient reports doing well.  He has no complaints at this time.  He has no chest pain or shortness of breath.  He lives a sedentary lifestyle.  He has a skin tear on his hand.  He has had no recent falls.  The skin tear occurred when he bumped his wheelchair with his hand.  he denies chest pain, palpitations, dyspnea, PND, orthopnea, nausea, vomiting, dizziness, syncope, edema, weight gain, or early satiety.   Review of systems complete and found to be negative unless listed in HPI.      EP Information / Studies Reviewed:    EKG is not ordered today. EKG from 10/19/23 reviewed which showed AV paced      PPM Interrogation-  reviewed in detail today,  See PACEART report.  Device History: Abbott Dual Chamber PPM implanted 11/12/2015 for Tachy-Brady syndrome  Risk Assessment/Calculations:    CHA2DS2-VASc Score = 4   This indicates a 4.8% annual risk of stroke. The patient's score is based upon: CHF History: 0 HTN History: 0 Diabetes History: 0 Stroke History: 2 Vascular Disease History: 0 Age Score: 2 Gender Score: 0            Physical Exam:   VS:  BP 116/69 (BP Location: Right Arm, Patient Position: Sitting, Cuff Size: Large)   Pulse 62   Ht 5\' 9"  (1.753 m)   Wt 195 lb (88.5 kg)   SpO2 94%   BMI 28.80 kg/m    Wt Readings from Last 3 Encounters:  01/30/24 195 lb (88.5 kg)  10/19/23 192 lb 9.6 oz (87.4 kg)  02/15/23 183 lb (83 kg)     GEN: Well nourished, well developed in no acute distress NECK: No JVD; No  carotid bruits CARDIAC: Regular rate and rhythm, no murmurs, rubs, gallops RESPIRATORY:  Clear to auscultation without rales, wheezing or rhonchi  ABDOMEN: Soft, non-tender, non-distended EXTREMITIES:  No edema; No deformity   ASSESSMENT AND PLAN:    Tachy-Brady syndrome s/p Abbott PPM  Normal PPM function Sensing, threshold, impedance within normal limits Programming reviewed and appropriate See Pace Art report No changes today His device is nearing ERI.  We discussed generator change.  Risks and benefits were discussed.  He understands the risks and is agreed to the procedure.  Explained risks, benefits, and alternatives to PPM implantation, including but not limited to bleeding, infection, pneumothorax, pericardial effusion, lead dislodgement, heart attack, stroke, or death.  Pt verbalized understanding and agrees to proceed.  2.  Persistent atrial fibrillation: Not anticoagulated due to history of subdural hemorrhage with high fall risk.  3.  Secondary hypercoagulable state: Holding off on anticoagulation as above   Disposition:   Follow up with Dr. Lawana Pray as usual post procedure  Signed, Sugey Trevathan Cortland Ding, MD

## 2024-01-31 ENCOUNTER — Ambulatory Visit: Attending: Family Medicine

## 2024-02-04 ENCOUNTER — Other Ambulatory Visit: Payer: Self-pay | Admitting: Cardiovascular Disease

## 2024-02-07 NOTE — Progress Notes (Signed)
 Remote pacemaker transmission.

## 2024-02-29 ENCOUNTER — Other Ambulatory Visit: Payer: Self-pay | Admitting: Family Medicine

## 2024-02-29 DIAGNOSIS — J449 Chronic obstructive pulmonary disease, unspecified: Secondary | ICD-10-CM

## 2024-03-02 ENCOUNTER — Encounter

## 2024-03-02 LAB — CUP PACEART REMOTE DEVICE CHECK
Battery Remaining Longevity: 4 mo
Battery Remaining Percentage: 4 %
Battery Voltage: 2.71 V
Brady Statistic AP VP Percent: 95 %
Brady Statistic AP VS Percent: 1 %
Brady Statistic AS VP Percent: 4.1 %
Brady Statistic AS VS Percent: 1 %
Brady Statistic RA Percent Paced: 87 %
Brady Statistic RV Percent Paced: 92 %
Date Time Interrogation Session: 20250711020014
Implantable Lead Connection Status: 753985
Implantable Lead Connection Status: 753985
Implantable Lead Implant Date: 20170322
Implantable Lead Implant Date: 20170322
Implantable Lead Location: 753859
Implantable Lead Location: 753860
Implantable Pulse Generator Implant Date: 20170322
Lead Channel Impedance Value: 390 Ohm
Lead Channel Impedance Value: 430 Ohm
Lead Channel Pacing Threshold Amplitude: 0.75 V
Lead Channel Pacing Threshold Amplitude: 1 V
Lead Channel Pacing Threshold Pulse Width: 0.5 ms
Lead Channel Pacing Threshold Pulse Width: 0.5 ms
Lead Channel Sensing Intrinsic Amplitude: 2.5 mV
Lead Channel Sensing Intrinsic Amplitude: 8.5 mV
Lead Channel Setting Pacing Amplitude: 2 V
Lead Channel Setting Pacing Amplitude: 2.5 V
Lead Channel Setting Pacing Pulse Width: 0.5 ms
Lead Channel Setting Sensing Sensitivity: 2 mV
Pulse Gen Model: 2272
Pulse Gen Serial Number: 3134370

## 2024-03-06 ENCOUNTER — Telehealth: Payer: Self-pay | Admitting: Family Medicine

## 2024-03-06 NOTE — Telephone Encounter (Signed)
 You have not seen patient in over a year.

## 2024-03-06 NOTE — Telephone Encounter (Signed)
 Copied from CRM [id number]. Topic: General - Other >> Mar 06, 2024  9:22 AM Avram MATSU wrote: Reason for CRM: Kerri is calling from [employer] and was wondering if the patient still needed to come in for routine check ups. Has the been discussions about doing hospice or palliative care? Please call back [phone]

## 2024-03-07 NOTE — Telephone Encounter (Signed)
 Last time I spoke to patient/family they just wanted to see me as needed. He has specialists he is following with. No need to see me more frequently than desired. They did not request official hospice/palliative involvement that I am aware of.

## 2024-03-07 NOTE — Telephone Encounter (Signed)
 LVM for Rona letting her know that patient does not need to follow up here, have not seen in over a year so unsure if he has requested palliative/hospice care. To call back with any questions.

## 2024-03-08 ENCOUNTER — Other Ambulatory Visit (HOSPITAL_COMMUNITY): Payer: Self-pay

## 2024-03-25 ENCOUNTER — Other Ambulatory Visit: Payer: Self-pay | Admitting: Family Medicine

## 2024-03-25 DIAGNOSIS — J449 Chronic obstructive pulmonary disease, unspecified: Secondary | ICD-10-CM

## 2024-03-26 DIAGNOSIS — J9811 Atelectasis: Secondary | ICD-10-CM | POA: Diagnosis not present

## 2024-03-26 DIAGNOSIS — Z79899 Other long term (current) drug therapy: Secondary | ICD-10-CM | POA: Diagnosis not present

## 2024-03-26 DIAGNOSIS — R2681 Unsteadiness on feet: Secondary | ICD-10-CM | POA: Diagnosis not present

## 2024-03-26 DIAGNOSIS — D696 Thrombocytopenia, unspecified: Secondary | ICD-10-CM | POA: Diagnosis not present

## 2024-03-26 DIAGNOSIS — R6520 Severe sepsis without septic shock: Secondary | ICD-10-CM | POA: Diagnosis not present

## 2024-03-26 DIAGNOSIS — I351 Nonrheumatic aortic (valve) insufficiency: Secondary | ICD-10-CM | POA: Diagnosis not present

## 2024-03-26 DIAGNOSIS — B9689 Other specified bacterial agents as the cause of diseases classified elsewhere: Secondary | ICD-10-CM | POA: Diagnosis not present

## 2024-03-26 DIAGNOSIS — R2689 Other abnormalities of gait and mobility: Secondary | ICD-10-CM | POA: Diagnosis not present

## 2024-03-26 DIAGNOSIS — R609 Edema, unspecified: Secondary | ICD-10-CM | POA: Diagnosis not present

## 2024-03-26 DIAGNOSIS — I130 Hypertensive heart and chronic kidney disease with heart failure and stage 1 through stage 4 chronic kidney disease, or unspecified chronic kidney disease: Secondary | ICD-10-CM | POA: Diagnosis not present

## 2024-03-26 DIAGNOSIS — R0989 Other specified symptoms and signs involving the circulatory and respiratory systems: Secondary | ICD-10-CM | POA: Diagnosis not present

## 2024-03-26 DIAGNOSIS — Z743 Need for continuous supervision: Secondary | ICD-10-CM | POA: Diagnosis not present

## 2024-03-26 DIAGNOSIS — R9389 Abnormal findings on diagnostic imaging of other specified body structures: Secondary | ICD-10-CM | POA: Diagnosis not present

## 2024-03-26 DIAGNOSIS — R918 Other nonspecific abnormal finding of lung field: Secondary | ICD-10-CM | POA: Diagnosis not present

## 2024-03-26 DIAGNOSIS — Z7409 Other reduced mobility: Secondary | ICD-10-CM | POA: Diagnosis not present

## 2024-03-26 DIAGNOSIS — Z950 Presence of cardiac pacemaker: Secondary | ICD-10-CM | POA: Diagnosis not present

## 2024-03-26 DIAGNOSIS — I4891 Unspecified atrial fibrillation: Secondary | ICD-10-CM | POA: Diagnosis not present

## 2024-03-26 DIAGNOSIS — Z87891 Personal history of nicotine dependence: Secondary | ICD-10-CM | POA: Diagnosis not present

## 2024-03-26 DIAGNOSIS — R7881 Bacteremia: Secondary | ICD-10-CM | POA: Diagnosis not present

## 2024-03-26 DIAGNOSIS — E782 Mixed hyperlipidemia: Secondary | ICD-10-CM | POA: Diagnosis not present

## 2024-03-26 DIAGNOSIS — B965 Pseudomonas (aeruginosa) (mallei) (pseudomallei) as the cause of diseases classified elsewhere: Secondary | ICD-10-CM | POA: Diagnosis not present

## 2024-03-26 DIAGNOSIS — E785 Hyperlipidemia, unspecified: Secondary | ICD-10-CM | POA: Diagnosis not present

## 2024-03-26 DIAGNOSIS — R404 Transient alteration of awareness: Secondary | ICD-10-CM | POA: Diagnosis not present

## 2024-03-26 DIAGNOSIS — A4152 Sepsis due to Pseudomonas: Secondary | ICD-10-CM | POA: Diagnosis not present

## 2024-03-26 DIAGNOSIS — L539 Erythematous condition, unspecified: Secondary | ICD-10-CM | POA: Diagnosis not present

## 2024-03-26 DIAGNOSIS — I5032 Chronic diastolic (congestive) heart failure: Secondary | ICD-10-CM | POA: Diagnosis not present

## 2024-03-26 DIAGNOSIS — J9601 Acute respiratory failure with hypoxia: Secondary | ICD-10-CM | POA: Diagnosis not present

## 2024-03-26 DIAGNOSIS — R531 Weakness: Secondary | ICD-10-CM | POA: Diagnosis not present

## 2024-03-26 DIAGNOSIS — Z20822 Contact with and (suspected) exposure to covid-19: Secondary | ICD-10-CM | POA: Diagnosis not present

## 2024-03-26 DIAGNOSIS — N390 Urinary tract infection, site not specified: Secondary | ICD-10-CM | POA: Diagnosis not present

## 2024-03-26 DIAGNOSIS — I340 Nonrheumatic mitral (valve) insufficiency: Secondary | ICD-10-CM | POA: Diagnosis not present

## 2024-03-26 DIAGNOSIS — Z96641 Presence of right artificial hip joint: Secondary | ICD-10-CM | POA: Diagnosis not present

## 2024-03-26 DIAGNOSIS — R0602 Shortness of breath: Secondary | ICD-10-CM | POA: Diagnosis not present

## 2024-03-26 DIAGNOSIS — A419 Sepsis, unspecified organism: Secondary | ICD-10-CM | POA: Diagnosis not present

## 2024-03-26 DIAGNOSIS — R41841 Cognitive communication deficit: Secondary | ICD-10-CM | POA: Diagnosis not present

## 2024-03-26 DIAGNOSIS — N1832 Chronic kidney disease, stage 3b: Secondary | ICD-10-CM | POA: Diagnosis not present

## 2024-03-26 DIAGNOSIS — M6281 Muscle weakness (generalized): Secondary | ICD-10-CM | POA: Diagnosis not present

## 2024-03-26 DIAGNOSIS — R509 Fever, unspecified: Secondary | ICD-10-CM | POA: Diagnosis not present

## 2024-03-26 DIAGNOSIS — I499 Cardiac arrhythmia, unspecified: Secondary | ICD-10-CM | POA: Diagnosis not present

## 2024-03-26 DIAGNOSIS — Z8673 Personal history of transient ischemic attack (TIA), and cerebral infarction without residual deficits: Secondary | ICD-10-CM | POA: Diagnosis not present

## 2024-03-26 DIAGNOSIS — I495 Sick sinus syndrome: Secondary | ICD-10-CM | POA: Diagnosis not present

## 2024-03-26 DIAGNOSIS — J449 Chronic obstructive pulmonary disease, unspecified: Secondary | ICD-10-CM | POA: Diagnosis not present

## 2024-03-26 DIAGNOSIS — J441 Chronic obstructive pulmonary disease with (acute) exacerbation: Secondary | ICD-10-CM | POA: Diagnosis not present

## 2024-03-26 DIAGNOSIS — N3000 Acute cystitis without hematuria: Secondary | ICD-10-CM | POA: Diagnosis not present

## 2024-03-26 DIAGNOSIS — I361 Nonrheumatic tricuspid (valve) insufficiency: Secondary | ICD-10-CM | POA: Diagnosis not present

## 2024-03-26 DIAGNOSIS — Z66 Do not resuscitate: Secondary | ICD-10-CM | POA: Diagnosis not present

## 2024-03-26 DIAGNOSIS — I5030 Unspecified diastolic (congestive) heart failure: Secondary | ICD-10-CM | POA: Diagnosis not present

## 2024-03-26 DIAGNOSIS — E876 Hypokalemia: Secondary | ICD-10-CM | POA: Diagnosis not present

## 2024-03-28 ENCOUNTER — Encounter

## 2024-03-30 DIAGNOSIS — I499 Cardiac arrhythmia, unspecified: Secondary | ICD-10-CM | POA: Diagnosis not present

## 2024-03-30 DIAGNOSIS — I4891 Unspecified atrial fibrillation: Secondary | ICD-10-CM | POA: Diagnosis not present

## 2024-04-02 ENCOUNTER — Ambulatory Visit

## 2024-04-02 DIAGNOSIS — I495 Sick sinus syndrome: Secondary | ICD-10-CM

## 2024-04-03 ENCOUNTER — Ambulatory Visit: Payer: Self-pay | Admitting: Cardiology

## 2024-04-03 LAB — CUP PACEART REMOTE DEVICE CHECK
Battery Remaining Longevity: 3 mo
Battery Remaining Percentage: 3 %
Battery Voltage: 2.68 V
Brady Statistic AP VP Percent: 93 %
Brady Statistic AP VS Percent: 1 %
Brady Statistic AS VP Percent: 6 %
Brady Statistic AS VS Percent: 1 %
Brady Statistic RA Percent Paced: 84 %
Brady Statistic RV Percent Paced: 91 %
Date Time Interrogation Session: 20250808170507
Implantable Lead Connection Status: 753985
Implantable Lead Connection Status: 753985
Implantable Lead Implant Date: 20170322
Implantable Lead Implant Date: 20170322
Implantable Lead Location: 753859
Implantable Lead Location: 753860
Implantable Pulse Generator Implant Date: 20170322
Lead Channel Impedance Value: 360 Ohm
Lead Channel Impedance Value: 400 Ohm
Lead Channel Pacing Threshold Amplitude: 0.75 V
Lead Channel Pacing Threshold Amplitude: 1 V
Lead Channel Pacing Threshold Pulse Width: 0.5 ms
Lead Channel Pacing Threshold Pulse Width: 0.5 ms
Lead Channel Sensing Intrinsic Amplitude: 2.5 mV
Lead Channel Sensing Intrinsic Amplitude: 4.2 mV
Lead Channel Setting Pacing Amplitude: 2 V
Lead Channel Setting Pacing Amplitude: 2.5 V
Lead Channel Setting Pacing Pulse Width: 0.5 ms
Lead Channel Setting Sensing Sensitivity: 2 mV
Pulse Gen Model: 2272
Pulse Gen Serial Number: 3134370

## 2024-04-06 DIAGNOSIS — R2681 Unsteadiness on feet: Secondary | ICD-10-CM | POA: Diagnosis not present

## 2024-04-06 DIAGNOSIS — N1832 Chronic kidney disease, stage 3b: Secondary | ICD-10-CM | POA: Diagnosis not present

## 2024-04-06 DIAGNOSIS — R404 Transient alteration of awareness: Secondary | ICD-10-CM | POA: Diagnosis not present

## 2024-04-06 DIAGNOSIS — A419 Sepsis, unspecified organism: Secondary | ICD-10-CM | POA: Diagnosis not present

## 2024-04-06 DIAGNOSIS — F09 Unspecified mental disorder due to known physiological condition: Secondary | ICD-10-CM | POA: Diagnosis not present

## 2024-04-06 DIAGNOSIS — R2689 Other abnormalities of gait and mobility: Secondary | ICD-10-CM | POA: Diagnosis not present

## 2024-04-06 DIAGNOSIS — I130 Hypertensive heart and chronic kidney disease with heart failure and stage 1 through stage 4 chronic kidney disease, or unspecified chronic kidney disease: Secondary | ICD-10-CM | POA: Diagnosis not present

## 2024-04-06 DIAGNOSIS — I4891 Unspecified atrial fibrillation: Secondary | ICD-10-CM | POA: Diagnosis not present

## 2024-04-06 DIAGNOSIS — J9600 Acute respiratory failure, unspecified whether with hypoxia or hypercapnia: Secondary | ICD-10-CM | POA: Diagnosis not present

## 2024-04-06 DIAGNOSIS — I5030 Unspecified diastolic (congestive) heart failure: Secondary | ICD-10-CM | POA: Diagnosis not present

## 2024-04-06 DIAGNOSIS — N390 Urinary tract infection, site not specified: Secondary | ICD-10-CM | POA: Diagnosis not present

## 2024-04-06 DIAGNOSIS — R531 Weakness: Secondary | ICD-10-CM | POA: Diagnosis not present

## 2024-04-06 DIAGNOSIS — J441 Chronic obstructive pulmonary disease with (acute) exacerbation: Secondary | ICD-10-CM | POA: Diagnosis not present

## 2024-04-06 DIAGNOSIS — J9601 Acute respiratory failure with hypoxia: Secondary | ICD-10-CM | POA: Diagnosis not present

## 2024-04-06 DIAGNOSIS — M6281 Muscle weakness (generalized): Secondary | ICD-10-CM | POA: Diagnosis not present

## 2024-04-06 DIAGNOSIS — E782 Mixed hyperlipidemia: Secondary | ICD-10-CM | POA: Diagnosis not present

## 2024-04-06 DIAGNOSIS — D696 Thrombocytopenia, unspecified: Secondary | ICD-10-CM | POA: Diagnosis not present

## 2024-04-06 DIAGNOSIS — A4152 Sepsis due to Pseudomonas: Secondary | ICD-10-CM | POA: Diagnosis not present

## 2024-04-06 DIAGNOSIS — B965 Pseudomonas (aeruginosa) (mallei) (pseudomallei) as the cause of diseases classified elsewhere: Secondary | ICD-10-CM | POA: Diagnosis not present

## 2024-04-06 DIAGNOSIS — R41841 Cognitive communication deficit: Secondary | ICD-10-CM | POA: Diagnosis not present

## 2024-04-06 DIAGNOSIS — E876 Hypokalemia: Secondary | ICD-10-CM | POA: Diagnosis not present

## 2024-04-06 DIAGNOSIS — Z743 Need for continuous supervision: Secondary | ICD-10-CM | POA: Diagnosis not present

## 2024-04-06 DIAGNOSIS — J449 Chronic obstructive pulmonary disease, unspecified: Secondary | ICD-10-CM | POA: Diagnosis not present

## 2024-04-06 DIAGNOSIS — R7881 Bacteremia: Secondary | ICD-10-CM | POA: Diagnosis not present

## 2024-04-09 DIAGNOSIS — F09 Unspecified mental disorder due to known physiological condition: Secondary | ICD-10-CM | POA: Diagnosis not present

## 2024-04-09 DIAGNOSIS — J449 Chronic obstructive pulmonary disease, unspecified: Secondary | ICD-10-CM | POA: Diagnosis not present

## 2024-04-09 DIAGNOSIS — I5030 Unspecified diastolic (congestive) heart failure: Secondary | ICD-10-CM | POA: Diagnosis not present

## 2024-04-09 DIAGNOSIS — J9600 Acute respiratory failure, unspecified whether with hypoxia or hypercapnia: Secondary | ICD-10-CM | POA: Diagnosis not present

## 2024-04-26 DIAGNOSIS — N390 Urinary tract infection, site not specified: Secondary | ICD-10-CM | POA: Diagnosis not present

## 2024-04-26 DIAGNOSIS — R7881 Bacteremia: Secondary | ICD-10-CM | POA: Diagnosis not present

## 2024-04-26 DIAGNOSIS — I10 Essential (primary) hypertension: Secondary | ICD-10-CM | POA: Diagnosis not present

## 2024-04-26 DIAGNOSIS — A4152 Sepsis due to Pseudomonas: Secondary | ICD-10-CM | POA: Diagnosis not present

## 2024-04-26 DIAGNOSIS — N1832 Chronic kidney disease, stage 3b: Secondary | ICD-10-CM | POA: Diagnosis not present

## 2024-04-27 DIAGNOSIS — M25552 Pain in left hip: Secondary | ICD-10-CM | POA: Diagnosis not present

## 2024-04-27 DIAGNOSIS — M25551 Pain in right hip: Secondary | ICD-10-CM | POA: Diagnosis not present

## 2024-04-30 DIAGNOSIS — B965 Pseudomonas (aeruginosa) (mallei) (pseudomallei) as the cause of diseases classified elsewhere: Secondary | ICD-10-CM | POA: Diagnosis not present

## 2024-04-30 DIAGNOSIS — N390 Urinary tract infection, site not specified: Secondary | ICD-10-CM | POA: Diagnosis not present

## 2024-05-03 ENCOUNTER — Encounter

## 2024-05-08 ENCOUNTER — Ambulatory Visit

## 2024-05-08 DIAGNOSIS — Z23 Encounter for immunization: Secondary | ICD-10-CM | POA: Diagnosis not present

## 2024-05-08 DIAGNOSIS — M6281 Muscle weakness (generalized): Secondary | ICD-10-CM | POA: Diagnosis not present

## 2024-05-08 DIAGNOSIS — R2681 Unsteadiness on feet: Secondary | ICD-10-CM | POA: Diagnosis not present

## 2024-05-08 DIAGNOSIS — J441 Chronic obstructive pulmonary disease with (acute) exacerbation: Secondary | ICD-10-CM | POA: Diagnosis not present

## 2024-05-08 DIAGNOSIS — R278 Other lack of coordination: Secondary | ICD-10-CM | POA: Diagnosis not present

## 2024-05-09 DIAGNOSIS — Z79899 Other long term (current) drug therapy: Secondary | ICD-10-CM | POA: Diagnosis not present

## 2024-05-09 LAB — CUP PACEART REMOTE DEVICE CHECK
Battery Remaining Longevity: 1 mo
Battery Remaining Percentage: 2 %
Battery Voltage: 2.65 V
Brady Statistic AP VP Percent: 89 %
Brady Statistic AP VS Percent: 1 %
Brady Statistic AS VP Percent: 10 %
Brady Statistic AS VS Percent: 1 %
Brady Statistic RA Percent Paced: 58 %
Brady Statistic RV Percent Paced: 68 %
Date Time Interrogation Session: 20250916183117
Implantable Lead Connection Status: 753985
Implantable Lead Connection Status: 753985
Implantable Lead Implant Date: 20170322
Implantable Lead Implant Date: 20170322
Implantable Lead Location: 753859
Implantable Lead Location: 753860
Implantable Pulse Generator Implant Date: 20170322
Lead Channel Impedance Value: 400 Ohm
Lead Channel Impedance Value: 440 Ohm
Lead Channel Pacing Threshold Amplitude: 0.75 V
Lead Channel Pacing Threshold Amplitude: 1 V
Lead Channel Pacing Threshold Pulse Width: 0.5 ms
Lead Channel Pacing Threshold Pulse Width: 0.5 ms
Lead Channel Sensing Intrinsic Amplitude: 3.2 mV
Lead Channel Sensing Intrinsic Amplitude: 5.4 mV
Lead Channel Setting Pacing Amplitude: 2 V
Lead Channel Setting Pacing Amplitude: 2.5 V
Lead Channel Setting Pacing Pulse Width: 0.5 ms
Lead Channel Setting Sensing Sensitivity: 2 mV
Pulse Gen Model: 2272
Pulse Gen Serial Number: 3134370

## 2024-05-11 DIAGNOSIS — R260 Ataxic gait: Secondary | ICD-10-CM | POA: Diagnosis not present

## 2024-05-11 DIAGNOSIS — I480 Paroxysmal atrial fibrillation: Secondary | ICD-10-CM | POA: Diagnosis not present

## 2024-05-11 DIAGNOSIS — R54 Age-related physical debility: Secondary | ICD-10-CM | POA: Diagnosis not present

## 2024-05-11 DIAGNOSIS — R600 Localized edema: Secondary | ICD-10-CM | POA: Diagnosis not present

## 2024-05-18 NOTE — Progress Notes (Signed)
 Remote PPM Transmission

## 2024-06-04 ENCOUNTER — Encounter

## 2024-06-07 ENCOUNTER — Encounter

## 2024-06-08 ENCOUNTER — Encounter

## 2024-06-08 DIAGNOSIS — R260 Ataxic gait: Secondary | ICD-10-CM | POA: Diagnosis not present

## 2024-06-08 DIAGNOSIS — R54 Age-related physical debility: Secondary | ICD-10-CM | POA: Diagnosis not present

## 2024-06-08 DIAGNOSIS — N1832 Chronic kidney disease, stage 3b: Secondary | ICD-10-CM | POA: Diagnosis not present

## 2024-06-08 DIAGNOSIS — I480 Paroxysmal atrial fibrillation: Secondary | ICD-10-CM | POA: Diagnosis not present

## 2024-06-19 ENCOUNTER — Inpatient Hospital Stay (HOSPITAL_COMMUNITY)
Admission: EM | Admit: 2024-06-19 | Discharge: 2024-06-26 | DRG: 872 | Disposition: A | Discharge status: Skilled Nursing Facility | Attending: Family Medicine | Admitting: Family Medicine

## 2024-06-19 ENCOUNTER — Other Ambulatory Visit: Payer: Self-pay

## 2024-06-19 DIAGNOSIS — N390 Urinary tract infection, site not specified: Principal | ICD-10-CM | POA: Diagnosis present

## 2024-06-19 DIAGNOSIS — N1831 Chronic kidney disease, stage 3a: Secondary | ICD-10-CM | POA: Diagnosis present

## 2024-06-19 DIAGNOSIS — N136 Pyonephrosis: Secondary | ICD-10-CM | POA: Diagnosis present

## 2024-06-19 DIAGNOSIS — D696 Thrombocytopenia, unspecified: Secondary | ICD-10-CM | POA: Diagnosis present

## 2024-06-19 DIAGNOSIS — Z950 Presence of cardiac pacemaker: Secondary | ICD-10-CM

## 2024-06-19 DIAGNOSIS — A419 Sepsis, unspecified organism: Secondary | ICD-10-CM | POA: Diagnosis present

## 2024-06-19 DIAGNOSIS — R000 Tachycardia, unspecified: Secondary | ICD-10-CM | POA: Diagnosis not present

## 2024-06-19 DIAGNOSIS — A4151 Sepsis due to Escherichia coli [E. coli]: Principal | ICD-10-CM | POA: Diagnosis present

## 2024-06-19 DIAGNOSIS — N3289 Other specified disorders of bladder: Secondary | ICD-10-CM | POA: Diagnosis present

## 2024-06-19 DIAGNOSIS — Z66 Do not resuscitate: Secondary | ICD-10-CM | POA: Diagnosis present

## 2024-06-19 DIAGNOSIS — Z8709 Personal history of other diseases of the respiratory system: Secondary | ICD-10-CM

## 2024-06-19 DIAGNOSIS — N179 Acute kidney failure, unspecified: Secondary | ICD-10-CM | POA: Diagnosis present

## 2024-06-19 DIAGNOSIS — I495 Sick sinus syndrome: Secondary | ICD-10-CM | POA: Diagnosis present

## 2024-06-19 DIAGNOSIS — I4891 Unspecified atrial fibrillation: Secondary | ICD-10-CM | POA: Diagnosis not present

## 2024-06-19 DIAGNOSIS — R6520 Severe sepsis without septic shock: Secondary | ICD-10-CM | POA: Diagnosis present

## 2024-06-19 DIAGNOSIS — R7401 Elevation of levels of liver transaminase levels: Secondary | ICD-10-CM | POA: Diagnosis present

## 2024-06-19 DIAGNOSIS — Z8673 Personal history of transient ischemic attack (TIA), and cerebral infarction without residual deficits: Secondary | ICD-10-CM

## 2024-06-19 DIAGNOSIS — I4819 Other persistent atrial fibrillation: Secondary | ICD-10-CM | POA: Diagnosis present

## 2024-06-19 DIAGNOSIS — Z87891 Personal history of nicotine dependence: Secondary | ICD-10-CM

## 2024-06-19 DIAGNOSIS — I5032 Chronic diastolic (congestive) heart failure: Secondary | ICD-10-CM | POA: Diagnosis present

## 2024-06-19 DIAGNOSIS — I679 Cerebrovascular disease, unspecified: Secondary | ICD-10-CM | POA: Diagnosis present

## 2024-06-19 DIAGNOSIS — Z1152 Encounter for screening for COVID-19: Secondary | ICD-10-CM

## 2024-06-19 DIAGNOSIS — J449 Chronic obstructive pulmonary disease, unspecified: Secondary | ICD-10-CM | POA: Diagnosis present

## 2024-06-19 NOTE — ED Triage Notes (Signed)
 Pt BIB [facility] from [facility] c/o R side abdominal pain. Pt stated he has N/V since 2100. Pt AMS at baseline. Pt is compliant with meds.  63 HR CBG 206

## 2024-06-20 ENCOUNTER — Emergency Department (HOSPITAL_COMMUNITY)

## 2024-06-20 DIAGNOSIS — N390 Urinary tract infection, site not specified: Secondary | ICD-10-CM | POA: Diagnosis not present

## 2024-06-20 DIAGNOSIS — I700 Atherosclerosis of aorta: Secondary | ICD-10-CM | POA: Diagnosis not present

## 2024-06-20 DIAGNOSIS — I4891 Unspecified atrial fibrillation: Secondary | ICD-10-CM

## 2024-06-20 DIAGNOSIS — R7401 Elevation of levels of liver transaminase levels: Secondary | ICD-10-CM | POA: Diagnosis not present

## 2024-06-20 DIAGNOSIS — A419 Sepsis, unspecified organism: Secondary | ICD-10-CM | POA: Diagnosis not present

## 2024-06-20 DIAGNOSIS — I495 Sick sinus syndrome: Secondary | ICD-10-CM | POA: Diagnosis not present

## 2024-06-20 DIAGNOSIS — N179 Acute kidney failure, unspecified: Secondary | ICD-10-CM | POA: Diagnosis not present

## 2024-06-20 DIAGNOSIS — J449 Chronic obstructive pulmonary disease, unspecified: Secondary | ICD-10-CM | POA: Diagnosis not present

## 2024-06-20 DIAGNOSIS — I517 Cardiomegaly: Secondary | ICD-10-CM | POA: Diagnosis not present

## 2024-06-20 DIAGNOSIS — I5032 Chronic diastolic (congestive) heart failure: Secondary | ICD-10-CM | POA: Diagnosis not present

## 2024-06-20 DIAGNOSIS — Z950 Presence of cardiac pacemaker: Secondary | ICD-10-CM | POA: Diagnosis not present

## 2024-06-20 DIAGNOSIS — I4819 Other persistent atrial fibrillation: Secondary | ICD-10-CM | POA: Diagnosis not present

## 2024-06-20 DIAGNOSIS — N3289 Other specified disorders of bladder: Secondary | ICD-10-CM | POA: Diagnosis not present

## 2024-06-20 DIAGNOSIS — R6520 Severe sepsis without septic shock: Secondary | ICD-10-CM | POA: Diagnosis not present

## 2024-06-20 DIAGNOSIS — Z66 Do not resuscitate: Secondary | ICD-10-CM | POA: Diagnosis not present

## 2024-06-20 DIAGNOSIS — M129 Arthropathy, unspecified: Secondary | ICD-10-CM | POA: Diagnosis not present

## 2024-06-20 DIAGNOSIS — N2882 Megaloureter: Secondary | ICD-10-CM | POA: Diagnosis not present

## 2024-06-20 DIAGNOSIS — R4182 Altered mental status, unspecified: Secondary | ICD-10-CM | POA: Diagnosis not present

## 2024-06-20 DIAGNOSIS — A4151 Sepsis due to Escherichia coli [E. coli]: Secondary | ICD-10-CM | POA: Diagnosis not present

## 2024-06-20 DIAGNOSIS — N1831 Chronic kidney disease, stage 3a: Secondary | ICD-10-CM | POA: Diagnosis not present

## 2024-06-20 DIAGNOSIS — D696 Thrombocytopenia, unspecified: Secondary | ICD-10-CM | POA: Diagnosis not present

## 2024-06-20 DIAGNOSIS — N281 Cyst of kidney, acquired: Secondary | ICD-10-CM | POA: Diagnosis not present

## 2024-06-20 DIAGNOSIS — I679 Cerebrovascular disease, unspecified: Secondary | ICD-10-CM | POA: Diagnosis not present

## 2024-06-20 DIAGNOSIS — Z8673 Personal history of transient ischemic attack (TIA), and cerebral infarction without residual deficits: Secondary | ICD-10-CM | POA: Diagnosis not present

## 2024-06-20 DIAGNOSIS — Z87891 Personal history of nicotine dependence: Secondary | ICD-10-CM | POA: Diagnosis not present

## 2024-06-20 DIAGNOSIS — N136 Pyonephrosis: Secondary | ICD-10-CM | POA: Diagnosis not present

## 2024-06-20 DIAGNOSIS — Z1152 Encounter for screening for COVID-19: Secondary | ICD-10-CM | POA: Diagnosis not present

## 2024-06-20 DIAGNOSIS — K5730 Diverticulosis of large intestine without perforation or abscess without bleeding: Secondary | ICD-10-CM | POA: Diagnosis not present

## 2024-06-20 DIAGNOSIS — Z7401 Bed confinement status: Secondary | ICD-10-CM | POA: Diagnosis not present

## 2024-06-20 DIAGNOSIS — R000 Tachycardia, unspecified: Secondary | ICD-10-CM | POA: Diagnosis not present

## 2024-06-20 DIAGNOSIS — K8020 Calculus of gallbladder without cholecystitis without obstruction: Secondary | ICD-10-CM | POA: Diagnosis not present

## 2024-06-20 LAB — CBC WITH DIFFERENTIAL/PLATELET
Abs Immature Granulocytes: 0.05 K/uL (ref 0.00–0.07)
Basophils Absolute: 0 K/uL (ref 0.0–0.1)
Basophils Relative: 0 %
Eosinophils Absolute: 0 K/uL (ref 0.0–0.5)
Eosinophils Relative: 0 %
HCT: 44.7 % (ref 39.0–52.0)
Hemoglobin: 14.8 g/dL (ref 13.0–17.0)
Immature Granulocytes: 0 %
Lymphocytes Relative: 17 %
Lymphs Abs: 1.9 K/uL (ref 0.7–4.0)
MCH: 34.3 pg — ABNORMAL HIGH (ref 26.0–34.0)
MCHC: 33.1 g/dL (ref 30.0–36.0)
MCV: 103.5 fL — ABNORMAL HIGH (ref 80.0–100.0)
Monocytes Absolute: 0.5 K/uL (ref 0.1–1.0)
Monocytes Relative: 5 %
Neutro Abs: 8.8 K/uL — ABNORMAL HIGH (ref 1.7–7.7)
Neutrophils Relative %: 78 %
Platelets: 144 K/uL — ABNORMAL LOW (ref 150–400)
RBC: 4.32 MIL/uL (ref 4.22–5.81)
RDW: 13.8 % (ref 11.5–15.5)
WBC: 11.3 K/uL — ABNORMAL HIGH (ref 4.0–10.5)
nRBC: 0 % (ref 0.0–0.2)

## 2024-06-20 LAB — I-STAT CG4 LACTIC ACID, ED
Lactic Acid, Venous: 2.8 mmol/L (ref 0.5–1.9)
Lactic Acid, Venous: 2.4 mmol/L (ref 0.5–1.9)

## 2024-06-20 LAB — URINALYSIS, ROUTINE W REFLEX MICROSCOPIC
Bilirubin Urine: NEGATIVE
Glucose, UA: NEGATIVE mg/dL
Ketones, ur: NEGATIVE mg/dL
Nitrite: NEGATIVE
Protein, ur: 30 mg/dL — AB
Specific Gravity, Urine: 1.021 (ref 1.005–1.030)
WBC, UA: >50 WBC/hpf (ref 0–5)
pH: 6 (ref 5.0–8.0)

## 2024-06-20 LAB — RESP PANEL BY RT-PCR (RSV, FLU A&B, COVID)  RVPGX2
Influenza A by PCR: NEGATIVE
Influenza B by PCR: NEGATIVE
Resp Syncytial Virus by PCR: NEGATIVE
SARS Coronavirus 2 by RT PCR: NEGATIVE

## 2024-06-20 LAB — COMPREHENSIVE METABOLIC PANEL WITH GFR
ALT: 58 U/L — ABNORMAL HIGH (ref 0–44)
AST: 103 U/L — ABNORMAL HIGH (ref 15–41)
Albumin: 3.5 g/dL (ref 3.5–5.0)
Alkaline Phosphatase: 90 U/L (ref 38–126)
Anion gap: 12 (ref 5–15)
BUN: 26 mg/dL — ABNORMAL HIGH (ref 8–23)
CO2: 23 mmol/L (ref 22–32)
Calcium: 9.1 mg/dL (ref 8.9–10.3)
Chloride: 102 mmol/L (ref 98–111)
Creatinine, Ser: 1.79 mg/dL — ABNORMAL HIGH (ref 0.61–1.24)
GFR, Estimated: 33 mL/min — ABNORMAL LOW (ref >60)
Glucose, Bld: 152 mg/dL — ABNORMAL HIGH (ref 70–99)
Potassium: 3.8 mmol/L (ref 3.5–5.1)
Sodium: 137 mmol/L (ref 135–145)
Total Bilirubin: 2.7 mg/dL — ABNORMAL HIGH (ref 0.0–1.2)
Total Protein: 7.1 g/dL (ref 6.5–8.1)

## 2024-06-20 LAB — LIPASE, BLOOD: Lipase: 39 U/L (ref 11–51)

## 2024-06-20 LAB — TROPONIN I (HIGH SENSITIVITY): Troponin I (High Sensitivity): 13 ng/L (ref <18)

## 2024-06-20 LAB — BRAIN NATRIURETIC PEPTIDE: B Natriuretic Peptide: 198.8 pg/mL — ABNORMAL HIGH (ref 0.0–100.0)

## 2024-06-20 LAB — LACTIC ACID, PLASMA: Lactic Acid, Venous: 3 mmol/L (ref 0.5–1.9)

## 2024-06-20 MED ORDER — IOHEXOL 350 MG/ML SOLN
75.0000 mL | Freq: Once | INTRAVENOUS | Status: AC | PRN
  Administered 2024-06-20: 75 mL via INTRAVENOUS

## 2024-06-20 MED ORDER — LACTATED RINGERS IV BOLUS
1000.0000 mL | Freq: Once | INTRAVENOUS | Status: AC
  Administered 2024-06-20: 1000 mL via INTRAVENOUS

## 2024-06-20 MED ORDER — ONDANSETRON HCL 4 MG/2ML IJ SOLN
4.0000 mg | Freq: Four times a day (QID) | INTRAMUSCULAR | Status: DC | PRN
  Administered 2024-06-20: 4 mg via INTRAVENOUS
  Filled 2024-06-20: qty 2

## 2024-06-20 MED ORDER — CHLORHEXIDINE GLUCONATE CLOTH 2 % EX PADS
6.0000 | MEDICATED_PAD | Freq: Every day | CUTANEOUS | Status: DC
  Administered 2024-06-21 – 2024-06-26 (×5): 6 via TOPICAL

## 2024-06-20 MED ORDER — LACTATED RINGERS IV BOLUS
250.0000 mL | Freq: Once | INTRAVENOUS | Status: AC
  Administered 2024-06-20: 250 mL via INTRAVENOUS

## 2024-06-20 MED ORDER — SODIUM CHLORIDE 0.9 % IV BOLUS: 1000.0000 mL | Freq: Once | INTRAVENOUS | Status: DC

## 2024-06-20 MED ORDER — ATORVASTATIN CALCIUM 40 MG PO TABS: 40.0000 mg | ORAL_TABLET | Freq: Every day | ORAL | Status: DC

## 2024-06-20 MED ORDER — SODIUM CHLORIDE 0.9 % IV SOLN: 1.0000 g | Freq: Two times a day (BID) | INTRAVENOUS | Status: DC

## 2024-06-20 MED ORDER — ONDANSETRON HCL 4 MG/2ML IJ SOLN
4.0000 mg | Freq: Once | INTRAMUSCULAR | Status: AC
  Administered 2024-06-20: 4 mg via INTRAVENOUS
  Filled 2024-06-20: qty 2

## 2024-06-20 MED ORDER — AMIODARONE HCL IN DEXTROSE 360-4.14 MG/200ML-% IV SOLN
60.0000 mg/h | INTRAVENOUS | Status: DC
  Administered 2024-06-20 (×2): 60 mg/h via INTRAVENOUS
  Filled 2024-06-20 (×2): qty 200

## 2024-06-20 MED ORDER — ONDANSETRON HCL 4 MG/2ML IJ SOLN: 4.0000 mg | Freq: Four times a day (QID) | INTRAMUSCULAR | Status: DC | PRN

## 2024-06-20 MED ORDER — AMIODARONE LOAD VIA INFUSION
150.0000 mg | Freq: Once | INTRAVENOUS | Status: AC
  Administered 2024-06-20: 150 mg via INTRAVENOUS
  Filled 2024-06-20: qty 83.34

## 2024-06-20 MED ORDER — LACTATED RINGERS IV BOLUS: 1000.0000 mL | Freq: Once | INTRAVENOUS | Status: DC

## 2024-06-20 MED ORDER — SODIUM CHLORIDE 0.9 % IV SOLN
1.0000 g | Freq: Once | INTRAVENOUS | Status: DC
  Administered 2024-06-20: 1 g via INTRAVENOUS
  Filled 2024-06-20: qty 10

## 2024-06-20 MED ORDER — POLYETHYLENE GLYCOL 3350 17 G PO PACK
17.0000 g | PACK | Freq: Every day | ORAL | Status: DC | PRN
Start: 2024-06-20 — End: 2024-06-27

## 2024-06-20 MED ORDER — ACETAMINOPHEN 325 MG PO TABS
650.0000 mg | ORAL_TABLET | Freq: Four times a day (QID) | ORAL | Status: DC | PRN
  Filled 2024-06-20: qty 2

## 2024-06-20 MED ORDER — LACTATED RINGERS IV BOLUS
500.0000 mL | Freq: Once | INTRAVENOUS | Status: AC
  Administered 2024-06-20: 500 mL via INTRAVENOUS

## 2024-06-20 MED ORDER — IOHEXOL 350 MG/ML SOLN: 75.0000 mL | Freq: Once | INTRAVENOUS | Status: DC | PRN

## 2024-06-20 MED ORDER — METOCLOPRAMIDE HCL 5 MG/ML IJ SOLN
5.0000 mg | Freq: Once | INTRAMUSCULAR | Status: AC
  Administered 2024-06-20: 5 mg via INTRAVENOUS
  Filled 2024-06-20: qty 2

## 2024-06-20 MED ORDER — ACETAMINOPHEN 500 MG PO TABS: 1000.0000 mg | ORAL_TABLET | Freq: Four times a day (QID) | ORAL | Status: DC | PRN

## 2024-06-20 MED ORDER — SODIUM CHLORIDE 0.9 % IV SOLN: 2.0000 g | INTRAVENOUS | Status: DC

## 2024-06-20 MED ORDER — UMECLIDINIUM BROMIDE 62.5 MCG/ACT IN AEPB
1.0000 | INHALATION_SPRAY | Freq: Every day | RESPIRATORY_TRACT | Status: DC
  Administered 2024-06-22 – 2024-06-23 (×2): 1 via RESPIRATORY_TRACT
  Filled 2024-06-20 (×4): qty 7

## 2024-06-20 MED ORDER — MELATONIN 3 MG PO TABS: 6.0000 mg | ORAL_TABLET | Freq: Every evening | ORAL | Status: DC | PRN

## 2024-06-20 MED ORDER — AMIODARONE LOAD VIA INFUSION: 150.0000 mg | Freq: Once | INTRAVENOUS | Status: DC

## 2024-06-20 MED ORDER — SODIUM CHLORIDE 0.9 % IV SOLN
2.0000 g | INTRAVENOUS | Status: DC
  Administered 2024-06-20 – 2024-06-22 (×3): 2 g via INTRAVENOUS
  Filled 2024-06-20 (×4): qty 12.5

## 2024-06-20 MED ORDER — SODIUM CHLORIDE 0.9% FLUSH
3.0000 mL | Freq: Two times a day (BID) | INTRAVENOUS | Status: DC
  Administered 2024-06-20 – 2024-06-26 (×9): 3 mL via INTRAVENOUS

## 2024-06-20 MED ORDER — ONDANSETRON HCL 4 MG/2ML IJ SOLN: 4.0000 mg | Freq: Once | INTRAMUSCULAR | Status: DC

## 2024-06-20 MED ORDER — LACTATED RINGERS IV SOLN
INTRAVENOUS | Status: AC
Start: 2024-06-20 — End: 2024-06-21

## 2024-06-20 MED ORDER — AMIODARONE HCL IN DEXTROSE 360-4.14 MG/200ML-% IV SOLN
30.0000 mg/h | INTRAVENOUS | Status: DC
  Administered 2024-06-20: 30 mg/h via INTRAVENOUS

## 2024-06-20 MED ORDER — ALBUTEROL SULFATE (2.5 MG/3ML) 0.083% IN NEBU
2.5000 mg | INHALATION_SOLUTION | RESPIRATORY_TRACT | Status: DC | PRN
  Filled 2024-06-20: qty 3

## 2024-06-20 MED ORDER — METOCLOPRAMIDE HCL 5 MG/ML IJ SOLN: 5.0000 mg | Freq: Four times a day (QID) | INTRAMUSCULAR | Status: DC | PRN

## 2024-06-20 NOTE — ED Notes (Signed)
 Provider messaged again. Pt continues to vomit despite zofran  administration. Made aware of current HR and BP, and verifying if amio gtt is to be continued at this time, awaiting response.

## 2024-06-20 NOTE — ED Notes (Addendum)
 Pt son Shawnta taking pt watch home. Pt sleeping at this time

## 2024-06-20 NOTE — ED Notes (Signed)
 Pt stating he is nauseous, provided with emesis bag, MD messaged for antiemetic.

## 2024-06-20 NOTE — H&P (Signed)
 History and Physical    Tobias Avitabile FMW:[id number] DOB: [birth year] DOA: 06/19/2024  PCP: Almarie Waddell NOVAK, NP   Patient coming from: Home   Chief Complaint:  Chief Complaint  Patient presents with   Abdominal Pain    HPI:  Justin Baker is a 89 y.o. male with hx of A-fib not on AC, SSS, PPM, HFpEF, CVA, SAH, COPD, CKD3b, recent hx of Pseudomonas stutzeri and Actinotignum schaalii bacteremia in 8/'25 (later is primarily urinary pathogen on my review; although Ucx during hospitalization neg), treated with a course of cefepime -> amoxicillin, now brought in from nursing home due to R flank pain, N/V. Reports acute onset of symptoms yesterday evening. No fever, chills, rigors. He otherwise feels well and has no other complaints. Does note chronic issues emptying bladder on further questioning     Review of Systems:  ROS complete and negative except as marked above   No Known Allergies  Prior to Admission medications   Medication Sig Start Date End Date Taking? Authorizing Provider  atorvastatin  (LIPITOR) 40 MG tablet Take 1 tablet by mouth once daily 12/28/23   Nishan, Peter C, MD  furosemide  (LASIX ) 20 MG tablet Take 1 tablet (20 mg total) by mouth as needed for weight gain/swelling. Patient not taking: Reported on 01/30/2024 05/06/23   Nishan, Peter C, MD  metoprolol  succinate (TOPROL -XL) 50 MG 24 hr tablet Take 1.5 tablets (75 mg total) by mouth daily. Patient not taking: Reported on 01/30/2024 07/15/23   Nishan, Peter C, MD  metoprolol  tartrate (LOPRESSOR ) 25 MG tablet TAKE 1 TABLET TWICE DAILY IN ADDITION TO 50MG  11/08/23   Nishan, Peter C, MD  metoprolol  tartrate (LOPRESSOR ) 50 MG tablet Take 1 tablet (50 mg total) by mouth 2 (two) times daily. 02/07/24   Nishan, Peter C, MD  Tiotropium Bromide  Monohydrate (SPIRIVA  RESPIMAT) 2.5 MCG/ACT AERS Inhale 2 sprays into the lungs daily. 03/26/24   Almarie Waddell NOVAK, NP    Past Medical History:  Diagnosis Date   ARF (acute renal failure) 05/16/2012    Atrial fibrillation (HCC)    Back pain    COPD (chronic obstructive pulmonary disease) (HCC)    self diagnosed   COVID 01/21/2021   Osteoarthritis    s/p right total hip   Pain in the chest    Shoulder pain, bilateral    Stroke (HCC)    Subdural hematoma (HCC)    Caused by fall   Tachy-brady syndrome (HCC)    a. s/p STJ dual chamber PPM    Thrombocytopenia    Viral respiratory illness 07/09/2014    Past Surgical History:  Procedure Laterality Date   CARDIOVERSION  05/17/2012   Procedure: CARDIOVERSION;  Surgeon: Toribio JONELLE Fuel, MD;  Location: [facility] ENDOSCOPY;  Service: Cardiovascular;  Laterality: [name];   CATARACT EXTRACTION W/ INTRAOCULAR LENS IMPLANT     right   EP IMPLANTABLE DEVICE [name] 11/12/2015   SJM Assurity MRI DR PPM implanted by Dr Fernande for tachy/brady syndrome   PACEMAKER IMPLANT     TEE WITHOUT CARDIOVERSION  05/17/2012   Procedure: TRANSESOPHAGEAL ECHOCARDIOGRAM (TEE);  Surgeon: Toribio JONELLE Fuel, MD;  Location: [facility] ENDOSCOPY;  Service: Cardiovascular;  Laterality: [name];   TOTAL HIP ARTHROPLASTY     right     reports that he has quit smoking. He quit smokeless tobacco use about 47 years ago. He reports that he does not drink alcohol and does not use drugs.  Family History  Problem Relation Age of Onset   Tuberculosis Father  Other Unknown        No known heart disease     Physical Exam: Vitals:   06/20/24 0411 06/20/24 0412 06/20/24 0413 06/20/24 0419  BP: (!) 88/58   (!) 88/58  Pulse:  80  80  Resp: (!) 22 (!) 21 20 (!) 21  Temp:      TempSrc:      SpO2:  100%  100%  Weight:      Height:        Gen: Awake, alert, chronically ill appearing, but younger than stated age   CV: Tachycardic and irregular, normal S1, S2, no murmurs  Resp: Normal WOB, Few rales in the bases   Abd: Flat, normoactive, mild RLQ tenderness, no CVAT  MSK: Symmetric, 1+ BL LE  Skin: No rashes or lesions to exposed skin  Neuro: Alert and interactive  Psych: euthymic,  appropriate    Data review:   Labs reviewed, notable for:   Cr 1.79 ~ b/l  AST 103 ALT 58  T Bili 2.7  Alk phos 90   WBC 11  UA grossly c/w infection   Micro:  Results for orders placed or performed during the hospital encounter of 06/19/24  Resp panel by RT-PCR (RSV, Flu A&B, Covid) Anterior Nasal Swab     Status: None   Collection Time: 06/20/24 12:03 AM   Specimen: Anterior Nasal Swab  Result Value Ref Range Status   SARS Coronavirus 2 by RT PCR NEGATIVE NEGATIVE Final   Influenza A by PCR NEGATIVE NEGATIVE Final   Influenza B by PCR NEGATIVE NEGATIVE Final    Comment: (NOTE) The Xpert Xpress SARS-CoV-2/FLU/RSV plus assay is intended as an aid in the diagnosis of influenza from Nasopharyngeal swab specimens and should not be used as a sole basis for treatment. Nasal washings and aspirates are unacceptable for Xpert Xpress SARS-CoV-2/FLU/RSV testing.  Fact Sheet for Patients: bloggercourse.com  Fact Sheet for Healthcare Providers: seriousbroker.it  This test is not yet approved or cleared by the [place]  FDA and has been authorized for detection and/or diagnosis of SARS-CoV-2 by FDA under an Emergency Use Authorization (EUA). This EUA will remain in effect (meaning this test can be used) for the duration of the COVID-19 declaration under Section 564(b)(1) of the Act, 21 U.S.C. section 360bbb-3(b)(1), unless the authorization is terminated or revoked.     Resp Syncytial Virus by PCR NEGATIVE NEGATIVE Final    Comment: (NOTE) Fact Sheet for Patients: bloggercourse.com  Fact Sheet for Healthcare Providers: seriousbroker.it  This test is not yet approved or cleared by the [place]  FDA and has been authorized for detection and/or diagnosis of SARS-CoV-2 by FDA under an Emergency Use Authorization (EUA). This EUA will remain in effect (meaning this test  can be used) for the duration of the COVID-19 declaration under Section 564(b)(1) of the Act, 21 U.S.C. section 360bbb-3(b)(1), unless the authorization is terminated or revoked.  Performed at [facility], [address], [place], [place] [address]     Imaging reviewed:  CT ABDOMEN PELVIS W CONTRAST Result Date: 06/20/2024 EXAM: CT ABDOMEN AND PELVIS WITH CONTRAST 06/20/2024 01:15:20 AM TECHNIQUE: CT of the abdomen and pelvis was performed with the administration of 75 mL iohexol  (OMNIPAQUE ) 350 MG/ML injection. Multiplanar reformatted images are provided for review. Automated exposure control, iterative reconstruction, and/or weight-based adjustment of the mA/kV was utilized to reduce the radiation dose to as low as reasonably achievable. COMPARISON: 09:263 CLINICAL HISTORY: Abdominal pain. Table formatting from the original  note was not included. 75cc omni350. Patient brought in by [facility] from [facility] complaining of right side abdominal pain. Patient stated he has nausea/vomiting since 2100. Patient is AMS at baseline. Patient is compliant with medications. FINDINGS: LOWER CHEST: There is minimal patchy atelectasis/airspace disease in the lung bases. LIVER: There is some relative hyperdensity surrounding the gallbladder fossa which may be related to focal fatty sparing. There is mild diffuse fatty infiltration of the liver. GALLBLADDER AND BILE DUCTS: Small gallstones are present. No biliary ductal dilatation. SPLEEN: No acute abnormality. PANCREAS: No acute abnormality. ADRENAL GLANDS: No acute abnormality. KIDNEYS, URETERS AND BLADDER: There are numerous bilateral renal cysts. The right kidney is well aligned. Largest cyst on the right measures 10.8 cm. Larger cyst in the left measures 7.7 cm. There is an 8 mm calculus in the right kidney. There is moderate dilatation of the right ureter, but no hydronephrosis. No obstructing calculus identified. Left ureter is nondilated. The  bladder is distended. There is trabeculated bladder wall. Per consensus, no follow-up is needed for simple Bosniak type 1 and 2 renal cysts, unless the patient has a malignancy history or risk factors. GI AND BOWEL: Stomach demonstrates no acute abnormality. There is sigmoid colon diverticulosis without evidence for acute diverticulitis. The appendix appears normal. There is no bowel obstruction. PERITONEUM AND RETROPERITONEUM: No ascites. No free air. VASCULATURE: Aorta is normal in caliber. There are atherosclerotic calcifications of the aorta. LYMPH NODES: No lymphadenopathy. REPRODUCTIVE ORGANS: There are calcifications in a mildly enlarged prostate gland. BONES AND SOFT TISSUES: Right hip arthroplasty is present. Degenerative changes affecting left hip and spine. No focal soft tissue abnormality. LIMITATIONS/ARTIFACTS: Examination is limited secondary to artifact from patient's arm positioning. IMPRESSION: 1. Moderate dilatation of the right ureter without hydronephrosis and no obstructing calculus identified; 8 mm nonobstructing right renal calculus. 2. Multiple bilateral renal cysts, largest measuring 10.8 cm on the right and 7.7 cm on the left. No follow-up imaging recommended. 3. Cholelithiasis. 4. No acute findings.Hepatic steatosis. 5. Sigmoid colon diverticulosis without evidence of acute diverticulitis. 6. Minimal bibasilar atelectasis/airspace disease. Electronically signed by: Greig Pique MD 06/20/2024 01:34 AM EDT RP Workstation: [id number]   DG Chest Portable 1 View Result Date: 06/20/2024 EXAM: 1 VIEW(S) XRAY OF THE CHEST 06/20/2024 12:25:00 AM COMPARISON: Portable chest 04/04/2024. CLINICAL HISTORY: AMS. Encounter for Altered Mental status, change in breathing. FINDINGS: LINES, TUBES AND DEVICES: Stable left chest dual lead pacing system and wire insertions. Multiple overlying monitor wires. LUNGS AND PLEURA: Low inspiration. Mild chronic interstitial changes of the lungs. No visible focal  pneumonia. No pulmonary edema. No pleural effusion. No pneumothorax. HEART AND MEDIASTINUM: Mild cardiomegaly. No vascular congestion is seen. Stable mediastinum with aortic calcification and mild tortuosity. BONES AND SOFT TISSUES: Osteopenia. Chronic rotator cuff arthropathy. Degenerative changes of the thoracic spine. IMPRESSION: 1. No acute cardiopulmonary findings. Low inspiration on exam. 2. Mild cardiomegaly without vascular congestion. Electronically signed by: Francis Quam MD 06/20/2024 12:32 AM EDT RP Workstation: [id number]    EKG:  Afib with RVR, rate 119, LAD, TW flattening latterally, TWI inferiorly    ED Course:  Treated with 1L of LR and CTX    Assessment/Plan:  89 y.o. male with hx A-fib not on AC, SSS, PPM, HFpEF, CVA, SAH, COPD, CKD3b, recent hx of Pseudomonas stutzeri and Actinotignum schaalii bacteremia in 8/'25 (later is primarily urinary pathogen on my review; although Ucx during hospitalization neg), treated with a course of cefepime -> amoxicillin, now brought in from nursing home  due to R flank pain, N/V. Found to have sepsis from complicated UTI   Complicated UTI  R hydroureter without obstructing stone, signs of chronic BOO likely underlying hydroureter  Sepsis, secondary to above, present on admission, with developing septic shock  Recent hx polymicrobial bacteremia 8/'25 with Pseudomonas stutzeri and Actinotignum schaalii   P/w acute R flank pain and N/V. On initial evaluation he is tachycardic with RVR into 140s, RR up to high 20s, and he has worsening BP trend now borderline hypotensive. Placed on O2 although no desat. CT A/P demonstrates moderate R hydroureter without an obstructing ureteral stone; there is a 8 mm nonobstructing R renal stone, also bladder is distended and trabeculated. He has a recent hx of polymicrobial bacteremia (although each set only isolated one of the pathogens, but both sets were positive) and was treated with abx as noted above. Notably  Actinotignum is a urinary pathogen, and pseudomonas stuzeri also a urinary pathogen and question if bladder outlet obstruction, or seeded R renal stone now a source of recurrent severe infections for him.  -- S/p dose of CTX 1g, broaden to Cefepime 2g IV q 24 hr.  -- s/p 1 L IVF, BP worsening, give another L of IVF, check lactate.  -- F/u Bcx, urine culture  -- Decompress bladder, place foley catheter   Afib with RVR Rates into the 140s. Suspect worsening control in setting of sepsis. High rates may also be affecting his forward CO.  -- Start on Amiodarone  bolus + gtt  -- Hold his home metoprolol  in setting of his hypotension  -- Not on anticoagulation due to [facility]   Acute liver injury, mixed pattern  CT with cholelithiasis no complicated biliary disease, steatosis  -- Trend LFT   Chronic medical problems:  SSS, PPM: Noted,  HFpEF:  Hold diuretics  CVA: Not on antiPLT. Continue home Atorvastatin   SAH: Noted  COPD: Home Incruse ellipta, nebs prn  CKD 3b: Cr near baseline at 1.7     Body mass index is 28.81 kg/m.    DVT prophylaxis:  SCDs Code Status:  DNR/DNI(Do NOT Intubate); confirmed with patient  Diet:  Diet Orders (From admission, onward)    None      Family Communication:  None   Consults:  None   Admission status:   Inpatient, Step Down Unit  Severity of Illness: The appropriate patient status for this patient is INPATIENT. Inpatient status is judged to be reasonable and necessary in order to provide the required intensity of service to ensure the patient's safety. The patient's presenting symptoms, physical exam findings, and initial radiographic and laboratory data in the context of their chronic comorbidities is felt to place them at high risk for further clinical deterioration. Furthermore, it is not anticipated that the patient will be medically stable for discharge from the hospital within 2 midnights of admission.   * I certify that at the point of admission it  is my clinical judgment that the patient will require inpatient hospital care spanning beyond 2 midnights from the point of admission due to high intensity of service, high risk for further deterioration and high frequency of surveillance required.*   Dorn Dawson, MD [facility]  How to contact the [facility] Attending or Consulting provider 7A - 7P or covering provider during after hours 7P -7A, for this patient.  Check the care team in [facility] and look for a) attending/consulting [facility] provider listed and b) the [facility] team listed Log into [email or web address] and use [facility]'s  universal password to access. If you do not have the password, please contact the hospital operator. Locate the [facility] provider you are looking for under [facility] and page to a number that you can be directly reached. If you still have difficulty reaching the provider, please page the [facility] (Director on Call) for the Hospitalists listed on amion for assistance.  06/20/2024, 4:30 AM

## 2024-06-20 NOTE — ED Notes (Signed)
 Pt now sleeping comfortably, remains on continuous cardiac monitoring

## 2024-06-20 NOTE — Progress Notes (Signed)
 Justin Baker  FMW:[id number] DOB: 07-27-1922 DOA: 06/19/2024 PCP: Almarie Waddell NOVAK, NP    Brief Narrative:  89 year old with a history of A-fib not on anticoagulation, SSS status post PPM, diastolic CHF, CVA, SAH, COPD, CKD stage IIIb, and a recent history of Pseudomonas and Actinotignum bacteremia treated with cefepime and amoxicillin who was transported to the ER 10/28 from his SNF with 24 hours of right flank pain nausea and vomiting.   Goals of Care:   Code Status: Limited: Do not attempt resuscitation (DNR) -DNR-LIMITED -Do Not Intubate/DNI    DVT prophylaxis: SCDs Start: 06/20/24 0528   Interim Hx: The patient was interviewed and examined by one of my partners earlier today.   Assessment & Plan:  Complicated UTI - right hydroureter without obstructing stone with chronic BOO - sepsis due to UTI - R renal calculus (non-obstructing) Presented with significant right flank pain nausea and vomiting - CT A/P noted moderate right hydroureter without obstructing ureteral stone with distended trabeculated bladder - continue empiric cefepime -Foley placed for bladder decompression - continues to require tailored volume expansion with episodes of hypotension   Recent history of polymicrobial bacteremia August 2025 Pseudomonas stutzeri and Actinotignum schaalii   Chronic atrial fibrillation with acute RVR RVR being driven by acute infection/early sepsis -amiodarone  drip utilized -usual metoprolol  held due to hypotension -not on anticoagulation with history of SAH -heart rate has stabilized on amio gtt but this may need to be discontinued if hypotension continues to be a challenge   Acute liver injury CT reveals cholelithiasis but no acute biliary disease - likely due to hypoperfusion - follow trend   CKD stage IIIb Creatinine presently stable near baseline of 1.7  Chronic diastolic CHF Holding diuretic for now  COPD Continue usual Incruse Ellipta and as needed nebs -oxygenation  stable at present  SSS status post PPM  History of CVA Not on antiplatelet medications given history of SAH -continue usual atorvastatin  once LFTs have normalized  History of SAH  Family Communication:  Disposition:     Objective: Blood pressure (!) 103/57, pulse (!) 58, temperature 98.6 F (37 C), temperature source Oral, resp. rate 20, height 5' 9 (1.753 m), weight 88.5 kg, SpO2 100%.  Intake/Output Summary (Last 24 hours) at 06/20/2024 0747 Last data filed at 06/20/2024 0608 Gross per 24 hour  Intake 2100 ml  Output --  Net 2100 ml   Filed Weights   06/19/24 2350  Weight: 88.5 kg    Examination: The patient was examined by one of my partners earlier today.  CBC: Recent Labs  Lab 06/20/24 0005  WBC 11.3*  NEUTROABS 8.8*  HGB 14.8  HCT 44.7  MCV 103.5*  PLT 144*   Basic Metabolic Panel: Recent Labs  Lab 06/20/24 0005  NA 137  K 3.8  CL 102  CO2 23  GLUCOSE 152*  BUN 26*  CREATININE 1.79*  CALCIUM  9.1   GFR: Estimated Creatinine Clearance: 24.1 mL/min (A) (by C-G formula based on SCr of 1.79 mg/dL (H)).   Scheduled Meds:  atorvastatin   40 mg Oral Daily   Chlorhexidine  Gluconate Cloth  6 each Topical Daily   sodium chloride  flush  3 mL Intravenous Q12H   umeclidinium bromide  1 puff Inhalation Daily   Continuous Infusions:  amiodarone  60 mg/hr (06/20/24 0522)   Followed by   amiodarone      ceFEPime (MAXIPIME) IV Stopped (06/20/24 0723)     LOS: 0 days   Reyes IVAR Moores, MD [facility] Office  [phone] Pager - Text Page per [place]  If 7PM-7AM, please contact night-coverage per Amion 06/20/2024, 7:47 AM

## 2024-06-20 NOTE — ED Provider Notes (Signed)
 [place] EMERGENCY DEPARTMENT AT [facility] Provider Note   CSN: [id number] Arrival date & time: 06/19/24  2338     Patient presents with: Abdominal Pain   Justin Baker is a 89 y.o. male with a history of A-fib, cognitive impairment, and recent hospitalization for urosepsis brought in by EMS from a nursing facility, presents to the ED with nausea and vomiting that began after eating dinner tonight.  Patient also states that he is having abdominal pain on the right side that started after the vomiting. Patient denies any diarrhea, blood in stool, blood in emesis, headache, neurological changes, or urinary problems. No recent travel. No sick contacts.  EMS reports that patients oxygen saturation was in the high 80s on transport, patient does not have a history of wearing home oxygen. The patient denies any chest pain, shortness of breath, cough, or fever.     Abdominal Pain      Prior to Admission medications   Medication Sig Start Date End Date Taking? Authorizing Provider  atorvastatin  (LIPITOR) 40 MG tablet Take 1 tablet by mouth once daily 12/28/23   Nishan, Peter C, MD  furosemide  (LASIX ) 20 MG tablet Take 1 tablet (20 mg total) by mouth as needed for weight gain/swelling. Patient not taking: Reported on 01/30/2024 05/06/23   Nishan, Peter C, MD  metoprolol  succinate (TOPROL -XL) 50 MG 24 hr tablet Take 1.5 tablets (75 mg total) by mouth daily. Patient not taking: Reported on 01/30/2024 07/15/23   Nishan, Peter C, MD  metoprolol  tartrate (LOPRESSOR ) 25 MG tablet TAKE 1 TABLET TWICE DAILY IN ADDITION TO 50MG  11/08/23   Nishan, Peter C, MD  metoprolol  tartrate (LOPRESSOR ) 50 MG tablet Take 1 tablet (50 mg total) by mouth 2 (two) times daily. 02/07/24   Nishan, Peter C, MD  Tiotropium Bromide  Monohydrate (SPIRIVA  RESPIMAT) 2.5 MCG/ACT AERS Inhale 2 sprays into the lungs daily. 03/26/24   Almarie Waddell NOVAK, NP    Allergies: Patient has no known allergies.    Review of Systems   Gastrointestinal:  Positive for abdominal pain.   Updated Vital Signs BP 119/83   Pulse (!) 31   Temp 98.6 F (37 C) (Oral)   Resp (!) 22   Ht 5' 9 (1.753 m)   Wt 88.5 kg   SpO2 97%   BMI 28.81 kg/m   Physical Exam Vitals and nursing note reviewed.  Constitutional:      General: He is not in acute distress.    Appearance: Normal appearance.  HENT:     Head: Normocephalic and atraumatic.  Eyes:     Extraocular Movements: Extraocular movements intact.     Conjunctiva/sclera: Conjunctivae normal.     Pupils: Pupils are equal, round, and reactive to light.  Cardiovascular:     Rate and Rhythm: Normal rate. Rhythm irregular. Occasional Extrasystoles are present.    Pulses: Normal pulses.          Radial pulses are 2+ on the right side.     Comments: Patient presents in atrial fibrillation -history of same. Pulmonary:     Effort: Pulmonary effort is normal. No respiratory distress.  Abdominal:     General: Abdomen is flat.     Palpations: Abdomen is soft.     Tenderness: There is abdominal tenderness.     Comments: Tenderness to palpation right upper quadrant, right lower quadrant.  No CVA tenderness.  No guarding, no rebound tenderness, no distention.  No hernia.  No ecchymosis.  No trauma.  Musculoskeletal:  General: Normal range of motion.     Cervical back: Normal range of motion.     Right lower leg: 2+ Pitting Edema present.     Left lower leg: 2+ Pitting Edema present.  Skin:    General: Skin is warm and dry.     Capillary Refill: Capillary refill takes less than 2 seconds.  Neurological:     General: No focal deficit present.     Mental Status: He is alert. Mental status is at baseline.     Comments: was a good historian during exam, alert to baseline.  Psychiatric:        Attention and Perception: Attention normal.        Mood and Affect: Mood normal.        Speech: Speech normal.        Cognition and Memory: Memory is impaired.     Comments: Patient  does have some slight impaired memory present during exam regarding supposed onset of abdominal pain.     (all labs ordered are listed, but only abnormal results are displayed) Labs Reviewed  CBC WITH DIFFERENTIAL/PLATELET - Abnormal; Notable for the following components:      Result Value   WBC 11.3 (*)    MCV 103.5 (*)    MCH 34.3 (*)    Platelets 144 (*)    Neutro Abs 8.8 (*)    All other components within normal limits  COMPREHENSIVE METABOLIC PANEL WITH GFR - Abnormal; Notable for the following components:   Glucose, Bld 152 (*)    BUN 26 (*)    Creatinine, Ser 1.79 (*)    AST 103 (*)    ALT 58 (*)    Total Bilirubin 2.7 (*)    GFR, Estimated 33 (*)    All other components within normal limits  BRAIN NATRIURETIC PEPTIDE - Abnormal; Notable for the following components:   B Natriuretic Peptide 198.8 (*)    All other components within normal limits  URINALYSIS, ROUTINE W REFLEX MICROSCOPIC - Abnormal; Notable for the following components:   Color, Urine AMBER (*)    APPearance TURBID (*)    Hgb urine dipstick SMALL (*)    Protein, ur 30 (*)    Leukocytes,Ua LARGE (*)    Bacteria, UA RARE (*)    Non Squamous Epithelial 0-5 (*)    All other components within normal limits  LACTIC ACID, PLASMA - Abnormal; Notable for the following components:   Lactic Acid, Venous 3.0 (*)    All other components within normal limits  RESP PANEL BY RT-PCR (RSV, FLU A&B, COVID)  [id number]  URINE CULTURE  CULTURE, BLOOD (ROUTINE X 2)  CULTURE, BLOOD (ROUTINE X 2)  LIPASE, BLOOD  LACTIC ACID, PLASMA  TROPONIN I (HIGH SENSITIVITY)    EKG: EKG Interpretation Date/Time:  Tuesday June 19 2024 23:45:37 EDT Ventricular Rate:  119 PR Interval:    QRS Duration:  84 QT Interval:  346 QTC Calculation: 487 R Axis:   -19  Text Interpretation: Atrial fibrillation Borderline left axis deviation Abnormal R-wave progression, late transition Repolarization abnormality, prob rate related Borderline  prolonged QT interval Confirmed by Jerral Meth [id number]) on 06/20/2024 12:23:09 AM  Radiology: CT ABDOMEN PELVIS W CONTRAST Result Date: 06/20/2024 EXAM: CT ABDOMEN AND PELVIS WITH CONTRAST 06/20/2024 01:15:20 AM TECHNIQUE: CT of the abdomen and pelvis was performed with the administration of 75 mL iohexol  (OMNIPAQUE ) 350 MG/ML injection. Multiplanar reformatted images are provided for review. Automated exposure control, iterative reconstruction, and/or weight-based  adjustment of the mA/kV was utilized to reduce the radiation dose to as low as reasonably achievable. COMPARISON: 09:263 CLINICAL HISTORY: Abdominal pain. Table formatting from the original note was not included. 75cc omni350. Patient brought in by [facility] from [facility] complaining of right side abdominal pain. Patient stated he has nausea/vomiting since 2100. Patient is AMS at baseline. Patient is compliant with medications. FINDINGS: LOWER CHEST: There is minimal patchy atelectasis/airspace disease in the lung bases. LIVER: There is some relative hyperdensity surrounding the gallbladder fossa which may be related to focal fatty sparing. There is mild diffuse fatty infiltration of the liver. GALLBLADDER AND BILE DUCTS: Small gallstones are present. No biliary ductal dilatation. SPLEEN: No acute abnormality. PANCREAS: No acute abnormality. ADRENAL GLANDS: No acute abnormality. KIDNEYS, URETERS AND BLADDER: There are numerous bilateral renal cysts. The right kidney is well aligned. Largest cyst on the right measures 10.8 cm. Larger cyst in the left measures 7.7 cm. There is an 8 mm calculus in the right kidney. There is moderate dilatation of the right ureter, but no hydronephrosis. No obstructing calculus identified. Left ureter is nondilated. The bladder is distended. There is trabeculated bladder wall. Per consensus, no follow-up is needed for simple Bosniak type 1 and 2 renal cysts, unless the patient has a malignancy history or  risk factors. GI AND BOWEL: Stomach demonstrates no acute abnormality. There is sigmoid colon diverticulosis without evidence for acute diverticulitis. The appendix appears normal. There is no bowel obstruction. PERITONEUM AND RETROPERITONEUM: No ascites. No free air. VASCULATURE: Aorta is normal in caliber. There are atherosclerotic calcifications of the aorta. LYMPH NODES: No lymphadenopathy. REPRODUCTIVE ORGANS: There are calcifications in a mildly enlarged prostate gland. BONES AND SOFT TISSUES: Right hip arthroplasty is present. Degenerative changes affecting left hip and spine. No focal soft tissue abnormality. LIMITATIONS/ARTIFACTS: Examination is limited secondary to artifact from patient's arm positioning. IMPRESSION: 1. Moderate dilatation of the right ureter without hydronephrosis and no obstructing calculus identified; 8 mm nonobstructing right renal calculus. 2. Multiple bilateral renal cysts, largest measuring 10.8 cm on the right and 7.7 cm on the left. No follow-up imaging recommended. 3. Cholelithiasis. 4. No acute findings.Hepatic steatosis. 5. Sigmoid colon diverticulosis without evidence of acute diverticulitis. 6. Minimal bibasilar atelectasis/airspace disease. Electronically signed by: Greig Pique MD 06/20/2024 01:34 AM EDT RP Workstation: [id number]   DG Chest Portable 1 View Result Date: 06/20/2024 EXAM: 1 VIEW(S) XRAY OF THE CHEST 06/20/2024 12:25:00 AM COMPARISON: Portable chest 04/04/2024. CLINICAL HISTORY: AMS. Encounter for Altered Mental status, change in breathing. FINDINGS: LINES, TUBES AND DEVICES: Stable left chest dual lead pacing system and wire insertions. Multiple overlying monitor wires. LUNGS AND PLEURA: Low inspiration. Mild chronic interstitial changes of the lungs. No visible focal pneumonia. No pulmonary edema. No pleural effusion. No pneumothorax. HEART AND MEDIASTINUM: Mild cardiomegaly. No vascular congestion is seen. Stable mediastinum with aortic calcification  and mild tortuosity. BONES AND SOFT TISSUES: Osteopenia. Chronic rotator cuff arthropathy. Degenerative changes of the thoracic spine. IMPRESSION: 1. No acute cardiopulmonary findings. Low inspiration on exam. 2. Mild cardiomegaly without vascular congestion. Electronically signed by: Francis Quam MD 06/20/2024 12:32 AM EDT RP Workstation: [id number]     Procedures   Medications Ordered in the ED  amiodarone  (NEXTERONE ) 1.8 mg/mL load via infusion 150 mg (150 mg Intravenous Bolus from Bag 06/20/24 0507)    Followed by  amiodarone  (NEXTERONE  PREMIX) 360-4.14 MG/200ML-% (1.8 mg/mL) IV infusion (60 mg/hr Intravenous New Bag/Given 06/20/24 0522)    Followed by  amiodarone  (NEXTERONE   PREMIX) 360-4.14 MG/200ML-% (1.8 mg/mL) IV infusion (has no administration in time range)  ceFEPIme (MAXIPIME) 2 g in sodium chloride  0.9 % 100 mL IVPB (2 g Intravenous New Bag/Given 06/20/24 0610)  Chlorhexidine  Gluconate Cloth 2 % PADS 6 each (has no administration in time range)  atorvastatin  (LIPITOR) tablet 40 mg (has no administration in time range)  umeclidinium bromide (INCRUSE ELLIPTA) 62.5 MCG/ACT 1 puff (has no administration in time range)  acetaminophen  (TYLENOL ) tablet 1,000 mg (has no administration in time range)  albuterol  (PROVENTIL ) (2.5 MG/3ML) 0.083% nebulizer solution 2.5 mg (has no administration in time range)  melatonin tablet 6 mg (has no administration in time range)  ondansetron  (ZOFRAN ) injection 4 mg (has no administration in time range)  polyethylene glycol (MIRALAX  / GLYCOLAX ) packet 17 g (has no administration in time range)  sodium chloride  flush (NS) 0.9 % injection 3 mL (has no administration in time range)  lactated ringers  bolus 1,000 mL (0 mLs Intravenous Stopped 06/20/24 0445)  ondansetron  (ZOFRAN ) injection 4 mg (4 mg Intravenous Given 06/20/24 0107)  iohexol  (OMNIPAQUE ) 350 MG/ML injection 75 mL (75 mLs Intravenous Contrast Given 06/20/24 0116)  lactated ringers  bolus 1,000  mL (0 mLs Intravenous Stopped 06/20/24 0608)    Clinical Course as of 06/20/24 0615  Wed Jun 20, 2024  0014 Stable 89 YOM with a chief complaint of abdominal pain after eating dinner tonight  O: New hypoxia/baseline afib with RVR newly to 120s+  A: Tachycardia: AfibRVR vs dehydration treating with IVF and reassessing  Abdoninal pain: Subacute. Difficult to localize 2/2 dementia. Labwork/CT and reassess.   Plan:  Reassessment showed patient has an acute urinary tract infection. [CC]    Clinical Course User Index [CC] Jerral Meth, MD                                 Medical Decision Making Amount and/or Complexity of Data Reviewed Labs: ordered. Radiology: ordered.   Patient presents to the ED for concern of abdominal pain and vomiting, this involves an extensive number of treatment options, and is a complaint that carries with it a high risk of complications and morbidity.    The differential diagnosis includes: Infectious etiology Metabolic/dehydration Hepatic etiology/cholecystitis GI etiology/obstruction  Co morbidities that complicate the patient evaluation: Recent hospitalization for urosepsis Cognitive impairment  A-fib  Additional history obtained: Additional history obtained from Outside Medical Records and Past Admission  External records from outside source obtained and reviewed including medical history, surgical history, allergies, medications.  Records from patient's nursing facility were reviewed. The patient was a slightly reliable historian given his cognitive impairment, providing a somewhat clear account of the presenting symptoms and relevant medical history. The information was obtained directly from the patient, and his family members he showed up for the visit and statements were documented in the patient's own words when possible. No discrepancies were noted between the history provided and available collateral sources.  Lab Tests: I  ordered, and personally interpreted labs.   The pertinent results include:  CMP hyperbilirubinemia CBC leukocytosis, neutrophilia Troponin - no acute findings BNP 198 Respiratory panel negative Urinalysis showed large amounts of leukocytes, hematuria  Imaging Studies ordered: I ordered imaging studies including: DG chest CT abdomen pelvis with contrast I independently visualized and interpreted imaging which showed: DG chest showed no acute cardiopulmonary findings. Low inspiration on exam. Mild cardiomegaly without vascular congestion. CT abdomen showed:  moderate dilatation of the right  ureter without hydronephrosis and no obstructing calculus identified; 8 mm nonobstructing right renal calculus.  Multiple bilateral renal cysts, largest measuring 10.8 cm on the right and 7.7 cm on the left. No follow-up imaging recommended. Cholelithiasis. No acute findings.Hepatic steatosis. Sigmoid colon diverticulosis without evidence of acute diverticulitis. Minimal bibasilar atelectasis/airspace disease. I agree with the radiologist interpretation  Cardiac Monitoring: The patient was maintained on a cardiac monitor.  I personally viewed and interpreted the cardiac monitored which showed an underlying rhythm of: Atrial fibrillation  Medicines ordered and prescription drug management: I ordered medications: Lactated ringer  bolus at 1000 mL for rehydration Zofran  4 mg for nausea Rocephin  1g for infection treatment  Reevaluation of the patient after these medicines showed that the patient improved I have reviewed the patients home medicines and have made adjustments as needed  Test Considered: No diagnostic testing was considered based on the patient's presenting symptoms, risk factors, and initial clinical assessment.  The approach to diagnostic testing prioritized exclusion of life-threatening conditions  Consultations Obtained:   Consultation was obtained from the internal medicine  service regarding admission and management of this patient's urosepsis. The case was discussed in detail, including relevant history, physical findings, and diagnostic results.   Problem List / ED Course: Problem List: Abdominal pain Nausea/vomiting Emergency Department Course: The patient, brought in by EMS from nursing facility, presented with nausea, vomiting, and abdominal pain after eating dinner tonight. Family arrived during the visit and stated that he had been more confused than normal for the past couple days. Initial assessment included history, physical exam, and review of prior medical records. CMP, CBC, urinalysis were completed for possible infectious etiology - patient was found to have leukocytosis with neutrophilia, increased creatinine 1.79 and hyperbilirubinemia.  Lipase negative.  BNP was assessed due to increased swelling in patient's lower extremities -198.  Urinalysis showed large amounts of leukocytes with hematuria.  Respiratory panel was negative.  Imaging showed no acute findings.  Vitals remained stable.  EMS reported that patient was hypoxic in the high 80s prior to arrival, patient has been kept on 3L and has been at 100% during visit.  Patient has a history of atrial fibrillation and upon arrival heart rate was into the 140s, however after fluid bolus heart rate down to the 120s.  EKG showed atrial fibrillation -patient has a history of same.  Given patient history, physical exam findings, laboratory studies, imaging, vital sign trends plan to admit patient to internal medicine service for urosepsis management and care. Reevaluation: After the interventions noted above, I reevaluated the patient and found that they have :stayed the same     Dispostion: After consideration of the diagnostic results and the patients response to treatment, I feel that the patent would benefit from admittance to internal medicine service with further care and evaluation of urosepsis. Clinical  Assessment:    Working diagnosis: Urosepsis Disposition Plan: Admit to internal medicine for further management due to urosepsis. Communication:   Patient and family informed of disposition decision and rationale. Questions addressed.  The diagnostic results and clinical impression were discussed with the patient and family at bedside and the patients family demonstrated understanding.         Final diagnoses:  Urinary tract infection with hematuria, site unspecified  Sepsis, due to unspecified organism, unspecified whether acute organ dysfunction present [facility])    ED Discharge Orders     None          Willma Duwaine CROME, [place] 06/20/24 [phone]  Jerral Meth, MD 06/20/24 [phone]

## 2024-06-20 NOTE — ED Notes (Signed)
 Awaiting incruse inhaler from main pharm

## 2024-06-20 NOTE — ED Notes (Signed)
 McClung, MD notified of patient low blood pressure and maps despite fluid bolus. Awaiting new orders at this time.

## 2024-06-20 NOTE — Progress Notes (Signed)
   06/20/24 1900  Vitals  Temp 98 F (36.7 C)  Temp Source Oral  BP 120/66  MAP (mmHg) 82  BP Location Right Arm  BP Method Automatic  Patient Position (if appropriate) Lying  Pulse Rate (!) 102  Pulse Rate Source Monitor  ECG Heart Rate (!) 102  Resp (!) 21  Level of Consciousness  Level of Consciousness Alert  MEWS COLOR  MEWS Score Color Yellow  Oxygen Therapy  SpO2 100 %  O2 Device Room Air  MEWS Score  MEWS Temp 0  MEWS Systolic 0  MEWS Pulse 1  MEWS RR 1  MEWS LOC 0  MEWS Score 2   Pt admitted to unit from ED. Pt A&Ox3, verbally responsive, and follows commands. Skin assessed with second RN per hospital policy with no pressure injuries noted. Blanchable erythema noted to sacral area, and sacral foam drsg applied. Tele applied and CCMD called. Pt oriented to unit, room, and call light system. Pt in bed with alarm on and bed in lowest position with call bell within reach. Report given to oncoming shift.

## 2024-06-20 NOTE — ED Notes (Signed)
 Pt awake and vomiting again, medicated per [facility]

## 2024-06-21 DIAGNOSIS — R6520 Severe sepsis without septic shock: Secondary | ICD-10-CM

## 2024-06-21 DIAGNOSIS — I679 Cerebrovascular disease, unspecified: Secondary | ICD-10-CM | POA: Insufficient documentation

## 2024-06-21 DIAGNOSIS — A419 Sepsis, unspecified organism: Secondary | ICD-10-CM | POA: Diagnosis not present

## 2024-06-21 LAB — CBC
HCT: 40 % (ref 39.0–52.0)
Hemoglobin: 13.1 g/dL (ref 13.0–17.0)
MCH: 33.9 pg (ref 26.0–34.0)
MCHC: 32.8 g/dL (ref 30.0–36.0)
MCV: 103.6 fL — ABNORMAL HIGH (ref 80.0–100.0)
Platelets: 122 K/uL — ABNORMAL LOW (ref 150–400)
RBC: 3.86 MIL/uL — ABNORMAL LOW (ref 4.22–5.81)
RDW: 14.4 % (ref 11.5–15.5)
WBC: 14.2 K/uL — ABNORMAL HIGH (ref 4.0–10.5)
nRBC: 0 % (ref 0.0–0.2)

## 2024-06-21 LAB — BLOOD CULTURE ID PANEL (REFLEXED) - BCID2

## 2024-06-21 LAB — COMPREHENSIVE METABOLIC PANEL WITH GFR
ALT: 272 U/L — ABNORMAL HIGH (ref 0–44)
AST: 270 U/L — ABNORMAL HIGH (ref 15–41)
Albumin: 2.5 g/dL — ABNORMAL LOW (ref 3.5–5.0)
Alkaline Phosphatase: 178 U/L — ABNORMAL HIGH (ref 38–126)
Anion gap: 13 (ref 5–15)
BUN: 26 mg/dL — ABNORMAL HIGH (ref 8–23)
CO2: 24 mmol/L (ref 22–32)
Calcium: 8.4 mg/dL — ABNORMAL LOW (ref 8.9–10.3)
Chloride: 102 mmol/L (ref 98–111)
Creatinine, Ser: 1.77 mg/dL — ABNORMAL HIGH (ref 0.61–1.24)
GFR, Estimated: 34 mL/min — ABNORMAL LOW (ref >60)
Glucose, Bld: 138 mg/dL — ABNORMAL HIGH (ref 70–99)
Potassium: 4.3 mmol/L (ref 3.5–5.1)
Sodium: 139 mmol/L (ref 135–145)
Total Bilirubin: 3.1 mg/dL — ABNORMAL HIGH (ref 0.0–1.2)
Total Protein: 5.6 g/dL — ABNORMAL LOW (ref 6.5–8.1)

## 2024-06-21 LAB — PHOSPHORUS: Phosphorus: 4.3 mg/dL (ref 2.5–4.6)

## 2024-06-21 LAB — MAGNESIUM: Magnesium: 1.7 mg/dL (ref 1.7–2.4)

## 2024-06-21 MED ORDER — ORAL CARE MOUTH RINSE: 15.0000 mL | OROMUCOSAL | Status: DC | PRN

## 2024-06-21 MED ORDER — LACTATED RINGERS IV SOLN: INTRAVENOUS | Status: AC

## 2024-06-21 NOTE — Evaluation (Signed)
 Physical Therapy Evaluation Patient Details Name: Justin Baker MRN: [id number] DOB: 03/05/1922 Today's Date: 06/21/2024  History of Present Illness  Pt is a 89 y.o. male who presented 06/20/24 from nursing home due to abdominal pain. Pt found to have sepsis from complicated UTI. PMH:  A-fib, SSS, PPM, HFpEF, CVA, SAH, COPD, CKD3b, recent hx of Pseudomonas stutzeri and Actinotignum schaalii bacteremia   Clinical Impression  Pt presents with condition above and deficits mentioned below, see PT Problem List. Currently, the pt displays deficits in cognition, impacting his accuracy with PLOF/home info. However, per pt and chart, pt was recently at a SNF for rehab after being admitted to a hospital in August. Prior to that, he was mod I for step pivot transfers to/from a w/c using a RW, living at home with his son. Pt currently displays deficits in balance, strength, power, activity tolerance, and lower extremity sensation. He is at high risk for falls, needing modAx2 for sit to stand transfers and minAx2 to step pivot using a RW. He tends to lean posteriorly and pull up on the RW during transfers. Recommend pt return to an inpatient rehab facility, <  3 hours/day, to further progress his safety and independence with mobility. Will continue to follow acutely.      If plan is discharge home, recommend the following: Two people to help with walking and/or transfers;A lot of help with bathing/dressing/bathroom;Assistance with cooking/housework;Direct supervision/assist for medications management;Direct supervision/assist for financial management;Assist for transportation;Help with stairs or ramp for entrance;Supervision due to cognitive status   Can travel by private vehicle   No    Equipment Recommendations Other (comment) (defer to next venue of care)  Recommendations for Other Services       Functional Status Assessment Patient has had a recent decline in their functional status and demonstrates  the ability to make significant improvements in function in a reasonable and predictable amount of time.     Precautions / Restrictions Precautions Precautions: Fall;Other (comment) Precaution/Restrictions Comments: watch BP (soft 10/30) Restrictions Weight Bearing Restrictions Per Provider Order: No      Mobility  Bed Mobility Overal bed mobility: Needs Assistance Bed Mobility: Supine to Sit     Supine to sit: Contact guard, HOB elevated, Used rails     General bed mobility comments: Extra time and effort with multiple attempts to push up on his arms to ascend his trunk, eventually successfully doing so with CGA for safety    Transfers Overall transfer level: Needs assistance Equipment used: Rolling walker (2 wheels) Transfers: Sit to/from Stand, Bed to chair/wheelchair/BSC Sit to Stand: Mod assist, +2 physical assistance, +2 safety/equipment   Step pivot transfers: Min assist, +2 safety/equipment, +2 physical assistance       General transfer comment: Pt with posterior lean pulling up on RW and displaying poor control and balance with sit to stand transfer, needing modAx2 to shift his weight anteriorly, keep RW on ground, and maintain his balance. MinAx2 for balance and RW management to step pivot to L from bed to chair.    Ambulation/Gait Ambulation/Gait assistance: Min assist, +2 physical assistance, +2 safety/equipment Gait Distance (Feet): 2 Feet Assistive device: Rolling walker (2 wheels) Gait Pattern/deviations: Step-to pattern, Decreased step length - right, Decreased step length - left, Decreased stride length, Leaning posteriorly Gait velocity: reduced Gait velocity interpretation: <1.31 ft/sec, indicative of household ambulator   General Gait Details: Pt takes slow, small, unsteady steps to L from bed to chair with RW support, needing minAx2 for balance due  to posterior lean and to manage RW when turning  Stairs            Wheelchair Mobility      Tilt Bed    Modified Rankin (Stroke Patients Only)       Balance Overall balance assessment: Needs assistance Sitting-balance support: No upper extremity supported, Feet supported Sitting balance-Leahy Scale: Fair Sitting balance - Comments: static sitting EOB with supervision for safety Postural control: Posterior lean Standing balance support: Bilateral upper extremity supported, Single extremity supported, During functional activity, Reliant on assistive device for balance Standing balance-Leahy Scale: Poor Standing balance comment: reliant on UE support and minA for balance, mild posterior lean noted                             Pertinent Vitals/Pain Pain Assessment Pain Assessment: Faces Faces Pain Scale: No hurt Pain Intervention(s): Monitored during session    Home Living Family/patient expects to be discharged to:: Skilled nursing facility                   Additional Comments: Pt with impaired cognition, but per pt and chart, pt has recently been at a SNF after hospital admission in August, prior to that he was living with son    Prior Function Prior Level of Function : Needs assist;Patient poor historian/Family not available             Mobility Comments: Per chart and pt, prior to hospital admission in August, pt was mod I for step pivot transfers to/from w/c using a RW. Pt used w/c for mobility. Since going to SNF, he has been getting PT and been needing assistance for mobility ADLs Comments: Prior to admission in August, pt was mod I for ADLs other than his son helping with socks, likely has been needing assistance for ADLs since admission to SNF     Extremity/Trunk Assessment   Upper Extremity Assessment Upper Extremity Assessment: Defer to OT evaluation    Lower Extremity Assessment Lower Extremity Assessment: Generalized weakness;RLE deficits/detail;LLE deficits/detail RLE Deficits / Details: edema noted with decreased sensation  in feet noted LLE Deficits / Details: edema noted with decreased sensation in feet noted       Communication   Communication Communication: Impaired Factors Affecting Communication: Hearing impaired    Cognition Arousal: Alert Behavior During Therapy: WFL for tasks assessed/performed   PT - Cognitive impairments: No family/caregiver present to determine baseline, Memory, Awareness, Safety/Judgement, Attention                       PT - Cognition Comments: Pt disoriented to date and unsure if he knew situation or not. He was confused, talking about therapists going to pick him apples to make apple juice and did not seem to fully grasp his location. Pt forgetting why therapists took off his socks and repeated himself at times. Following commands: Impaired Following commands impaired: Follows multi-step commands with increased time, Follows multi-step commands inconsistently     Cueing Cueing Techniques: Verbal cues, Tactile cues     General Comments General comments (skin integrity, edema, etc.): BP soft but stable    Exercises     Assessment/Plan    PT Assessment Patient needs continued PT services  PT Problem List Decreased strength;Decreased activity tolerance;Decreased balance;Decreased mobility;Decreased cognition;Decreased safety awareness;Cardiopulmonary status limiting activity;Impaired sensation       PT Treatment Interventions DME instruction;Gait training;Functional mobility training;Therapeutic activities;Therapeutic exercise;Balance  training;Neuromuscular re-education;Cognitive remediation;Patient/family education;Wheelchair mobility training    PT Goals (Current goals can be found in the Care Plan section)  Acute Rehab PT Goals Patient Stated Goal: to go home PT Goal Formulation: With patient Time For Goal Achievement: 07/05/24 Potential to Achieve Goals: Good    Frequency Min 2X/week     Co-evaluation   Reason for Co-Treatment: Necessary to  address cognition/behavior during functional activity;For patient/therapist safety;To address functional/ADL transfers PT goals addressed during session: Mobility/safety with mobility;Balance;Proper use of DME OT goals addressed during session: ADL's and self-care       AM-PAC PT 6 Clicks Mobility  Outcome Measure Help needed turning from your back to your side while in a flat bed without using bedrails?: A Little Help needed moving from lying on your back to sitting on the side of a flat bed without using bedrails?: A Little Help needed moving to and from a bed to a chair (including a wheelchair)?: Total Help needed standing up from a chair using your arms (e.g., wheelchair or bedside chair)?: Total Help needed to walk in hospital room?: Total Help needed climbing 3-5 steps with a railing? : Total 6 Click Score: 10    End of Session Equipment Utilized During Treatment: Gait belt Activity Tolerance: Patient tolerated treatment well Patient left: in chair;with call bell/phone within reach;with chair alarm set Nurse Communication: Mobility status PT Visit Diagnosis: Unsteadiness on feet (R26.81);Other abnormalities of gait and mobility (R26.89);Muscle weakness (generalized) (M62.81);Difficulty in walking, not elsewhere classified (R26.2)    Time: 8973-8955 PT Time Calculation (min) (ACUTE ONLY): 18 min   Charges:   PT Evaluation $PT Eval Moderate Complexity: 1 Mod   PT General Charges $$ ACUTE PT VISIT: 1 Visit         Theo Ferretti, PT, DPT Acute Rehabilitation Services  Office: [phone]   Theo CHRISTELLA Ferretti 06/21/2024, 11:23 AM

## 2024-06-21 NOTE — Assessment & Plan Note (Addendum)
 Presented with leukocytosis, tachypnea, hypotension, found to have AKI cr up to 1.8, Tbili 3.1, lactate 3, and thrombocytopenia.  CT confirms intra renal stone, right hydroureter without obstruction, consistent with R flank pain.  Recent Pseudomonas stutzeri and Actinotignum schaali bacteremia at [facility].  Urine culture here with Ecoli, blood culture with contaminant only, not likely infection. - Continue Rocephin  day 5, transition today to cefadroxil to complete 14 day course - Follow up with Urology

## 2024-06-21 NOTE — Assessment & Plan Note (Addendum)
 Appears euvolemic.  Lasix  is PRN at home - Hold Lasix 

## 2024-06-21 NOTE — Progress Notes (Signed)
  Progress Note   Patient: Justin Baker FMW:[id number] DOB: August 20, 1922 DOA: 06/19/2024     1 DOS: the patient was seen and examined on 06/21/2024 at 8:50 AM      Brief hospital course: 89 y.o. M with dCHF, hx CVA, SSS s/p PPM, AF not on AC, CKD IIIa baseline Cr 1.3 and recent admission to [facility] for sepsis from pseudomonal bacteremia who returns for flank pain, again found to have sepsis, urinary source.     Assessment and Plan: * Severe sepsis with end organ damage due to complicated UTI(HCC) Presented with leukocytosis, tachypnea, hypotension, found to have AKI cr up to 1.8, Tbili 3.1, lactate 3, and thrombocytopenia.  CT confirms intra renal stone, right hydroureter without obstruction, consistent with R flank pain.  Recent Pseudomonas stutzeri and Actinotignum schaali bacteremia at [facility]. - Continue cefepime - Hold metoprolol  furosemide  - Continue IV fluids - Hold Lipitor due to septic liver injury  AKI (acute kidney injury) Chronic kidney disease stage IIIa baseline 1.2 CKD IIIb ruled out, Cr in outside records consistently 1.2-1.3.  Here 1.8 on admission.  CT shows no frank obstruction although trabeculations to me suggest some chronic retention. - IV fluids - Maintain foley decompression - Trend Cr  Sick sinus syndrome (HCC) Pacemaker in place - Hold metoprolol  due to hypotension for now  Cerebrovascular disease - Hold Lipitor given transaminitis  Chronic diastolic CHF (congestive heart failure) (HCC) Appears euvolemic - Hold Lasix  for now given AKI  History of COPD No wheezing on exam - Resume Incruse  Persistent atrial fibrillation with RVR Not on anticoagulation - Hold metoprolol  due to hypotension  Positive blood culture CoNS in 1/2 is likely contaminant.  Urine culture with E coli at present.          Subjective: Patient is feeling better with fluids, his leg pain is gone.  He said no fever overnight, his blood pressure is still  soft.     Physical Exam: BP 102/60 (BP Location: Left Arm)   Pulse 68   Temp 97.9 F (36.6 C) (Oral)   Resp 19   Ht 5' 9 (1.753 m)   Wt 88.5 kg   SpO2 99%   BMI 28.81 kg/m   Elderly adult male, lying in bed, hard of hearing Irregularly irregular, rate controlled, soft SEM, no peripheral edema Respiratory rate normal, lungs clear without rales or wheezes Abdomen soft, no tenderness palpation or guarding, no ascites or distention Attention normal, affect normal, hard of hearing, face symmetric, speech fluent, all 4 extremities 5 -/5    Data Reviewed: Basic metabolic panel shows creatinine still 1.7 CBC shows leukocytosis, thrombocytopenia     Family Communication: Daughter by phone    Disposition: Status is: Inpatient         Author: Lonni SHAUNNA Dalton, MD 06/21/2024 4:17 PM  For on call review [email or web address].

## 2024-06-21 NOTE — Evaluation (Signed)
 Occupational Therapy Evaluation Patient Details Name: Justin Baker MRN: [id number] DOB: 09-23-1921 Today's Date: 06/21/2024   History of Present Illness   Pt is a 89 y.o. male who presented 06/20/24 due to abdominal pain. Pt found to have sepsis from complicated UTI. PMH:  A-fib, SSS, PPM, HFpEF, CVA, SAH, COPD, CKD3b, recent hx of Pseudomonas stutzeri and Actinotignum schaalii bacteremia     Clinical Impressions Pt's PLOF was gathered from chart review and it was unclear of most recent level of function as pt is a poor historian. He needed encouragement to complete med mobility with CGA, sit to stand transfers with mod x2 and then step pivoted to chair with min assist x2 for safety. Patient will benefit from continued inpatient follow up therapy, <3 hours/day.      If plan is discharge home, recommend the following:   Two people to help with walking and/or transfers;A lot of help with bathing/dressing/bathroom;Assistance with cooking/housework;Assistance with feeding;Direct supervision/assist for medications management;Direct supervision/assist for financial management;Assist for transportation;Supervision due to cognitive status     Functional Status Assessment   Patient has had a recent decline in their functional status and demonstrates the ability to make significant improvements in function in a reasonable and predictable amount of time.     Equipment Recommendations    (TBD)     Recommendations for Other Services         Precautions/Restrictions   Precautions Precautions: Fall;Other (comment) Precaution/Restrictions Comments: watch BP Restrictions Weight Bearing Restrictions Per Provider Order: No     Mobility Bed Mobility Overal bed mobility: Needs Assistance Bed Mobility: Supine to Sit     Supine to sit: Contact guard, HOB elevated, Used rails     General bed mobility comments: Extra time and effort with multiple attempts to push up on his arms to  ascend his trunk, eventually successfully doing so with CGA for safety    Transfers Overall transfer level: Needs assistance Equipment used: Rolling walker (2 wheels) Transfers: Sit to/from Stand, Bed to chair/wheelchair/BSC Sit to Stand: Mod assist, +2 physical assistance, +2 safety/equipment     Step pivot transfers: Min assist, +2 safety/equipment, +2 physical assistance     General transfer comment: Pt with posterior lean pulling up on RW and displaying poor control and balance with sit to stand transfer, needing modAx2 to shift his weight anteriorly, keep RW on ground, and maintain his balance. MinAx2 for balance and RW management to step pivot to L from bed to chair.      Balance Overall balance assessment: Needs assistance Sitting-balance support: No upper extremity supported, Feet supported Sitting balance-Leahy Scale: Fair Sitting balance - Comments: static sitting EOB with supervision for safety Postural control: Posterior lean Standing balance support: Bilateral upper extremity supported, Single extremity supported, During functional activity, Reliant on assistive device for balance Standing balance-Leahy Scale: Poor Standing balance comment: reliant on UE support and minA for balance, mild posterior lean noted                           ADL either performed or assessed with clinical judgement   ADL Overall ADL's : Needs assistance/impaired Eating/Feeding: Set up;Sitting   Grooming: Wash/dry face;Set up;Sitting   Upper Body Bathing: Minimal assistance;Sitting   Lower Body Bathing: Maximal assistance;Total assistance;Sit to/from stand   Upper Body Dressing : Contact guard assist;Minimal assistance;Sitting   Lower Body Dressing: Maximal assistance;Sit to/from stand   Toilet Transfer: Minimal assistance;+2 for physical assistance;+2 for safety/equipment;Rolling walker (  2 wheels)   Toileting- Clothing Manipulation and Hygiene: Maximal assistance;Total  assistance;Sit to/from stand       Functional mobility during ADLs: Minimal assistance;+2 for physical assistance;+2 for safety/equipment;Rolling walker (2 wheels)       Vision         Perception         Praxis         Pertinent Vitals/Pain Pain Assessment Pain Assessment: No/denies pain Faces Pain Scale: No hurt Pain Intervention(s): Limited activity within patient's tolerance     Extremity/Trunk Assessment Upper Extremity Assessment Upper Extremity Assessment: Generalized weakness   Lower Extremity Assessment Lower Extremity Assessment: Generalized weakness;RLE deficits/detail;LLE deficits/detail RLE Deficits / Details: edema noted with decreased sensation in feet noted LLE Deficits / Details: edema noted with decreased sensation in feet noted   Cervical / Trunk Assessment Cervical / Trunk Assessment: Kyphotic   Communication Communication Communication: Impaired Factors Affecting Communication: Hearing impaired   Cognition Arousal: Alert Behavior During Therapy: WFL for tasks assessed/performed Cognition: History of cognitive impairments, No family/caregiver present to determine baseline             OT - Cognition Comments: Pt was able to report they are in the hospital bt when asking why they would report they are lazy. He also kept naming fall months for the date.                 Following commands: Impaired Following commands impaired: Follows multi-step commands with increased time, Follows multi-step commands inconsistently     Cueing  General Comments   Cueing Techniques: Verbal cues;Tactile cues  bp soft but was stable in session   Exercises     Shoulder Instructions      Home Living Family/patient expects to be discharged to:: Skilled nursing facility                                 Additional Comments: Pt with impaired cognition, but per pt and chart, pt has recently been at a SNF after hospital admission in  August, prior to that he was living with son      Prior Functioning/Environment Prior Level of Function : Needs assist;Patient poor historian/Family not available             Mobility Comments: Per chart and pt, prior to hospital admission in August, pt was mod I for step pivot transfers to/from w/c using a RW. Pt used w/c for mobility. Since going to SNF, he has been getting PT and been needing assistance for mobility ADLs Comments: Prior to admission in August, pt was mod I for ADLs other than his son helping with socks, likely has been needing assistance for ADLs since admission to SNF    OT Problem List: Decreased strength;Decreased activity tolerance;Impaired balance (sitting and/or standing);Decreased knowledge of use of DME or AE;Decreased safety awareness   OT Treatment/Interventions: Self-care/ADL training;Therapeutic exercise;DME and/or AE instruction;Therapeutic activities;Patient/family education;Balance training      OT Goals(Current goals can be found in the care plan section)   Acute Rehab OT Goals Patient Stated Goal: none OT Goal Formulation: With patient Time For Goal Achievement: 07/05/24 Potential to Achieve Goals: Fair   OT Frequency:  Min 2X/week    Co-evaluation PT/OT/SLP Co-Evaluation/Treatment: Yes Reason for Co-Treatment: Necessary to address cognition/behavior during functional activity;For patient/therapist safety;To address functional/ADL transfers PT goals addressed during session: Mobility/safety with mobility;Balance;Proper use of DME OT goals addressed during session:  ADL's and self-care      AM-PAC OT 6 Clicks Daily Activity     Outcome Measure Help from another person eating meals?: A Little Help from another person taking care of personal grooming?: A Little Help from another person toileting, which includes using toliet, bedpan, or urinal?: A Lot Help from another person bathing (including washing, rinsing, drying)?: Total Help from  another person to put on and taking off regular upper body clothing?: A Little Help from another person to put on and taking off regular lower body clothing?: Total 6 Click Score: 13   End of Session Equipment Utilized During Treatment: Gait belt;Rolling walker (2 wheels) Nurse Communication: Mobility status  Activity Tolerance: Patient tolerated treatment well Patient left: in chair;with call bell/phone within reach;with chair alarm set  OT Visit Diagnosis: Unsteadiness on feet (R26.81);Other abnormalities of gait and mobility (R26.89);Repeated falls (R29.6);Muscle weakness (generalized) (M62.81)                Time: 8973-8955 OT Time Calculation (min): 18 min Charges:  OT General Charges $OT Visit: 1 Visit OT Evaluation $OT Eval Low Complexity: 1 Low  Warrick POUR OTR/L  Acute Rehab Services  [phone] office number   Warrick Berber 06/21/2024, 12:19 PM

## 2024-06-21 NOTE — TOC Progression Note (Signed)
 Transition of Care [facility]) - Progression Note    Patient Details  Name: Justin Baker MRN: [id number] Date of Birth: 19-Jun-1922  Transition of Care [facility]) CM/SW Contact  Montie LOISE Louder, [place] Phone Number: 06/21/2024, 12:32 PM  Clinical Narrative:     CSW met with patient- CSW introduced self and explained role. Patient confirmed he arrived from [facility]. CSW was given to call his son.  CSW called patient's son but spoke with his wife - she states the patient has been at [facility] since 09/25. She confirms the patient will return to [facility] once stable   Contacted [facility] to determine if authorization will be needed- waiting on response    Montie Louder, MSW, LCSW Clinical Social Worker    Expected Discharge Plan: Skilled Nursing Facility Barriers to Discharge: Continued Medical Work up               Expected Discharge Plan and Services In-house Referral: Clinical Social Work     Living arrangements for the past 2 months: Single Family Home                                       Social Drivers of Health (SDOH) Interventions SDOH Screenings   Food Insecurity: Low Risk  (11/07/2023)   Received from [facility]  Housing: Low Risk  (11/07/2023)   Received from [facility]  Transportation Needs: No Transportation Needs (11/07/2023)   Received from [facility]  Utilities: Low Risk  (11/07/2023)   Received from [facility]  Depression (PHQ2-9): Low Risk  (08/19/2022)  Financial Resource Strain: Low Risk  (02/14/2023)  Physical Activity: Unknown (02/14/2023)  Social Connections: Unknown (02/14/2023)  Stress: No Stress Concern Present (02/14/2023)  Tobacco Use: Medium Risk (04/04/2024)   Received from [facility]    Readmission Risk Interventions     No data to display

## 2024-06-21 NOTE — Assessment & Plan Note (Addendum)
 No wheezing on exam - Continue LAMA

## 2024-06-21 NOTE — Hospital Course (Addendum)
 89 y.o. M with dCHF, hx CVA, SSS s/p PPM, AF not on AC, CKD IIIa baseline Cr 1.3 and recent admission to [facility] for sepsis from pseudomonal bacteremia who returns for flank pain, again found to have sepsis, urinary source.

## 2024-06-21 NOTE — Assessment & Plan Note (Signed)
-   Hold Lipitor given transaminitis

## 2024-06-21 NOTE — Progress Notes (Signed)
 PHARMACY - PHYSICIAN COMMUNICATION CRITICAL VALUE ALERT - BLOOD CULTURE IDENTIFICATION (BCID)  Justin Baker is an 89 y.o. male who presented to [facility] on 06/19/2024 with a chief complaint of abdominal pain and R flank pain.  Assessment:  Blood cultures from 10/29 grew staphylcoccus species without identification in 1/4 aerobic bottles. This may be a contaminant, as the suspected source of infection appears to be urinary.Urine cultures are currently growing E. Coli.  Name of physician (or Provider) Contacted: Dr. Karrin  Current antibiotics: cefepime  Changes to prescribed antibiotics recommended:  Patient is on recommended antibiotics - No changes needed  Results for orders placed or performed during the hospital encounter of 06/19/24  Blood Culture ID Panel (Reflexed) (Collected: 06/20/2024  3:44 AM)  Result Value Ref Range   Enterococcus faecalis NOT DETECTED NOT DETECTED   Enterococcus Faecium NOT DETECTED NOT DETECTED   Listeria monocytogenes NOT DETECTED NOT DETECTED   Staphylococcus species DETECTED (A) NOT DETECTED   Staphylococcus aureus (BCID) NOT DETECTED NOT DETECTED   Staphylococcus epidermidis NOT DETECTED NOT DETECTED   Staphylococcus lugdunensis NOT DETECTED NOT DETECTED   Streptococcus species NOT DETECTED NOT DETECTED   Streptococcus agalactiae NOT DETECTED NOT DETECTED   Streptococcus pneumoniae NOT DETECTED NOT DETECTED   Streptococcus pyogenes NOT DETECTED NOT DETECTED   A.calcoaceticus-baumannii NOT DETECTED NOT DETECTED   Bacteroides fragilis NOT DETECTED NOT DETECTED   Enterobacterales NOT DETECTED NOT DETECTED   Enterobacter cloacae complex NOT DETECTED NOT DETECTED   Escherichia coli NOT DETECTED NOT DETECTED   Klebsiella aerogenes NOT DETECTED NOT DETECTED   Klebsiella oxytoca NOT DETECTED NOT DETECTED   Klebsiella pneumoniae NOT DETECTED NOT DETECTED   Proteus species NOT DETECTED NOT DETECTED   Salmonella species NOT DETECTED NOT DETECTED    Serratia marcescens NOT DETECTED NOT DETECTED   Haemophilus influenzae NOT DETECTED NOT DETECTED   Neisseria meningitidis NOT DETECTED NOT DETECTED   Pseudomonas aeruginosa NOT DETECTED NOT DETECTED   Stenotrophomonas maltophilia NOT DETECTED NOT DETECTED   Candida albicans NOT DETECTED NOT DETECTED   Candida auris NOT DETECTED NOT DETECTED   Candida glabrata NOT DETECTED NOT DETECTED   Candida krusei NOT DETECTED NOT DETECTED   Candida parapsilosis NOT DETECTED NOT DETECTED   Candida tropicalis NOT DETECTED NOT DETECTED   Cryptococcus neoformans/gattii NOT DETECTED NOT DETECTED    B. Maegan Rider Ermis, PharmD PGY-1 Pharmacy Resident [facility] 06/21/2024 1:45 PM

## 2024-06-21 NOTE — Assessment & Plan Note (Addendum)
 Chronic kidney disease stage IIIa baseline 1.2 CKD IIIb ruled out, Cr in outside records consistently 1.2-1.3.  Here 1.8 on admission.  CT shows no frank obstruction although trabeculations to me suggest some chronic retention. - Maintain foley, follow up with Urology within 1 week

## 2024-06-21 NOTE — Assessment & Plan Note (Addendum)
 Not on anticoagulation, unclear reason.  On hospital day 3, went into rapid Afib, metoprolol  resumed, now rate controlled - Continue metoprolol 

## 2024-06-21 NOTE — Progress Notes (Signed)
 This RN did handoff/report for patient to transfer to [facility]. This RN answered the patient's questions and the patient verbalized understanding.

## 2024-06-21 NOTE — Assessment & Plan Note (Addendum)
 Pacemaker in place PPM battery will expire within 1 month.  Discussed with family. - Resume metoprolol 

## 2024-06-22 DIAGNOSIS — A419 Sepsis, unspecified organism: Secondary | ICD-10-CM | POA: Diagnosis not present

## 2024-06-22 DIAGNOSIS — R6520 Severe sepsis without septic shock: Secondary | ICD-10-CM | POA: Diagnosis not present

## 2024-06-22 LAB — COMPREHENSIVE METABOLIC PANEL WITH GFR
ALT: 156 U/L — ABNORMAL HIGH (ref 0–44)
AST: 106 U/L — ABNORMAL HIGH (ref 15–41)
Albumin: 2.1 g/dL — ABNORMAL LOW (ref 3.5–5.0)
Alkaline Phosphatase: 143 U/L — ABNORMAL HIGH (ref 38–126)
Anion gap: 11 (ref 5–15)
BUN: 24 mg/dL — ABNORMAL HIGH (ref 8–23)
CO2: 26 mmol/L (ref 22–32)
Calcium: 8.1 mg/dL — ABNORMAL LOW (ref 8.9–10.3)
Chloride: 103 mmol/L (ref 98–111)
Creatinine, Ser: 1.59 mg/dL — ABNORMAL HIGH (ref 0.61–1.24)
GFR, Estimated: 39 mL/min — ABNORMAL LOW (ref >60)
Glucose, Bld: 97 mg/dL (ref 70–99)
Potassium: 3.6 mmol/L (ref 3.5–5.1)
Sodium: 140 mmol/L (ref 135–145)
Total Bilirubin: 1.8 mg/dL — ABNORMAL HIGH (ref 0.0–1.2)
Total Protein: 4.7 g/dL — ABNORMAL LOW (ref 6.5–8.1)

## 2024-06-22 LAB — CBC
HCT: 35.8 % — ABNORMAL LOW (ref 39.0–52.0)
Hemoglobin: 11.7 g/dL — ABNORMAL LOW (ref 13.0–17.0)
MCH: 34.1 pg — ABNORMAL HIGH (ref 26.0–34.0)
MCHC: 32.7 g/dL (ref 30.0–36.0)
MCV: 104.4 fL — ABNORMAL HIGH (ref 80.0–100.0)
Platelets: UNDETERMINED K/uL (ref 150–400)
RBC: 3.43 MIL/uL — ABNORMAL LOW (ref 4.22–5.81)
RDW: 14.4 % (ref 11.5–15.5)
WBC: 10.8 K/uL — ABNORMAL HIGH (ref 4.0–10.5)
nRBC: 0 % (ref 0.0–0.2)

## 2024-06-22 MED ORDER — LACTATED RINGERS IV SOLN: INTRAVENOUS | Status: DC

## 2024-06-22 MED ORDER — SODIUM CHLORIDE 0.9 % IV SOLN
2.0000 g | INTRAVENOUS | Status: DC
  Administered 2024-06-23 – 2024-06-24 (×2): 2 g via INTRAVENOUS
  Filled 2024-06-22 (×2): qty 20

## 2024-06-22 NOTE — Care Management Important Message (Signed)
 Important Message  Patient Details  Name: Justin Baker MRN: [id number] Date of Birth: June 28, 1922   Important Message Given:  Yes - Medicare IM     Claretta Deed 06/22/2024, 1:39 PM

## 2024-06-22 NOTE — TOC Initial Note (Signed)
 Transition of Care [facility]) - Initial/Assessment Note    Patient Details  Name: Justin Baker MRN: [id number] Date of Birth: [birth year]  Transition of Care [facility]) CM/SW Contact:    Lauraine FORBES Saa, LCSWA Phone Number: 06/22/2024, 12:23 PM  Clinical Narrative:             12:25 PM CLAPPS PG informed CSW that patient is LTC at [facility]. CSW informed SNF that patient is anticipated to discharge tomorrow. SNF confirmed they can admit patient tomorrow. CSW sent patient's FL2 to SNF. CSW will continue to follow.   Expected Discharge Plan: Long Term Nursing Home Barriers to Discharge: Continued Medical Work up   Patient Goals and CMS Choice            Expected Discharge Plan and Services In-house Referral: Clinical Social Work   Post Acute Care Choice: Skilled Nursing Facility Living arrangements for the past 2 months: Skilled Nursing Facility, Single Family Home                                      Prior Living Arrangements/Services Living arrangements for the past 2 months: Skilled Nursing Facility, Single Family Home Lives with:: Facility Resident, Self Patient language and need for interpreter reviewed:: No        Need for Family Participation in Patient Care: Yes (Comment) Care giver support system in place?: Yes (comment)   Criminal Activity/Legal Involvement Pertinent to Current Situation/Hospitalization: No - Comment as needed  Activities of Daily Living      Permission Sought/Granted Permission sought to share information with : Family Supports, Magazine Features Editor Permission granted to share information with : No (Contact information on chart)  Share Information with NAME: Maxime Beckner  Permission granted to share info w AGENCY: SNF  Permission granted to share info w Relationship: son  Permission granted to share info w Contact Information: [phone]  Emotional Assessment   Attitude/Demeanor/Rapport: Engaged Affect (typically  observed): Accepting, Appropriate, Pleasant Orientation: : Oriented to Self, Oriented to Place, Oriented to  Time, Oriented to Situation Alcohol / Substance Use: Not Applicable Psych Involvement: No (comment)  Admission diagnosis:  Complicated UTI (urinary tract infection) [N39.0] Urinary tract infection with hematuria, site unspecified [N39.0, R31.9] Sepsis, due to unspecified organism, unspecified whether acute organ dysfunction present [facility]) [A41.9] Patient Active Problem List   Diagnosis Date Noted   Cerebrovascular disease 06/21/2024   Severe sepsis with end organ damage due to complicated UTI(HCC) 06/20/2024   Blind 10/25/2022   History of COPD 10/24/2022   Pulmonary emphysema (HCC) 10/23/2022   Chronic diastolic CHF (congestive heart failure) (HCC) 10/23/2022   Macrocytic anemia with vitamin B12 deficiency 10/22/2022   Bilateral lower extremity edema 08/19/2022   Sepsis (HCC) 05/19/2022   Diverticulitis    Pneumonia of both lower lobes due to infectious organism 05/18/2022   UTI (urinary tract infection) 05/18/2022   Hypokalemia 05/18/2022   Acute encephalopathy 05/18/2022   Persistent atrial fibrillation with RVR 09/23/2021   Secondary hypercoagulable state 09/23/2021   SAH (subarachnoid hemorrhage) (HCC) 10/29/2019   Chronic anticoagulation 10/29/2019   Sick sinus syndrome (HCC)    Cerebrovascular accident (CVA) due to embolism of left middle cerebral artery (HCC) 05/22/2015   Receptive dysphasia 05/21/2015   Decreased cardiac ejection fraction 07/03/2012   AKI (acute kidney injury) 05/16/2012   Atrial fibrillation with rapid ventricular response (HCC) 05/15/2012   Shoulder pain, bilateral 05/15/2012  PCP:  Almarie Waddell NOVAK, NP Pharmacy:   [facility] - [place], [place] - [phone] [address] [place] [address] Phone: [phone] Fax: [phone]     Social Drivers of Health (SDOH) Social History: SDOH Screenings    Food Insecurity: Low Risk  (11/07/2023)   Received from [facility]  Housing: Low Risk  (11/07/2023)   Received from [facility]  Transportation Needs: No Transportation Needs (11/07/2023)   Received from [facility]  Utilities: Low Risk  (11/07/2023)   Received from [facility]  Depression (PHQ2-9): Low Risk  (08/19/2022)  Financial Resource Strain: Low Risk  (02/14/2023)  Physical Activity: Unknown (02/14/2023)  Social Connections: Unknown (02/14/2023)  Stress: No Stress Concern Present (02/14/2023)  Tobacco Use: Medium Risk (04/04/2024)   Received from [facility]   SDOH Interventions:     Readmission Risk Interventions     No data to display

## 2024-06-22 NOTE — Plan of Care (Signed)

## 2024-06-22 NOTE — Progress Notes (Signed)
  Progress Note   Patient: Justin Baker FMW:[id number] DOB: 19-May-1922 DOA: 06/19/2024     2 DOS: the patient was seen and examined on 06/22/2024 at 9:30AM      Brief hospital course: 89 y.o. M with dCHF, hx CVA, SSS s/p PPM, AF not on AC, CKD IIIa baseline Cr 1.3 and recent admission to [facility] for sepsis from pseudomonal bacteremia who returns for flank pain, again found to have sepsis, urinary source.     Assessment and Plan: * Severe sepsis with end organ damage due to complicated UTI(HCC) Blood cultures with contaminant only.  Urine culture now growing only E. coli.  Liver function test improving.  Renal function slightly better. - Continue antibiotics, narrowed to Rocephin  - Hold metoprolol  and furosemide  - Continue IV fluids - Maintain Foley for bladder drainage    AKI (acute kidney injury) Chronic kidney disease stage IIIa baseline 1.2 Urine output good, creatinine slightly better - Strict ins and outs - Continue IV fluids - Maintain Foley for bladder drainage  Sick sinus syndrome (HCC) -Hold metoprolol  given soft blood pressure  Cerebrovascular disease - Hold Lipitor given transaminitis  Chronic diastolic CHF (congestive heart failure) (HCC) Appears euvolemic --Hold Lasix   History of COPD No wheezing on exam - Continue LAMA  Persistent atrial fibrillation with RVR Not on anticoagulation - Hold Lasix  and metoprolol           Subjective: Afebrile, no respiratory distress, no nursing concerns.  Patient states he feels better         Physical Exam: BP 108/65   Pulse 65   Temp 97.6 F (36.4 C) (Oral)   Resp 18   Ht 5' 9 (1.753 m)   Wt 88.5 kg   SpO2 93%   BMI 28.81 kg/m   Elderly adult male, lying in bed, no acute distress RRR, no murmurs, no peripheral edema Respiratory rate normal, lungs clear without rales or wheezes Abdomen soft, he winces with palpation of the right side.  No guarding, no ascites Attention normal, very hard of  hearing, face symmetric, oriented to self, hospital, short-term memory seems slightly impaired, upper extremity strength 5 -/5 and symmetric    Data Reviewed: Basic adult metabolic panel shows normal electrolytes, creatinine down to 1.6 CBC shows dilution Urine culture with E. coli, sensitivities pending Blood culture with contaminant      Family Communication: Will call daughter later today    Disposition: Status is: Inpatient         Author: Lonni SHAUNNA Dalton, MD 06/22/2024 11:21 AM  For on call review [email or web address]

## 2024-06-22 NOTE — NC FL2 (Signed)
 [place]  MEDICAID FL2 LEVEL OF CARE FORM     IDENTIFICATION  Patient Name: Justin Baker Birthdate: 29-Apr-1922 Sex: male Admission Date (Current Location): 06/19/2024  [facility] and [place] Number:  Producer, Television/film/video and Address:  [facility], [address] [place], [place] [address]      Provider Number: [id number]  Attending Physician Name and Address:  Jonel Lonni SQUIBB, *  Relative Name and Phone Number:  Issaih Kaus.; Son; [phone]    Current Level of Care: Hospital Recommended Level of Care: Skilled Nursing Facility Prior Approval Number:    Date Approved/Denied:   PASRR Number: [id number]  Discharge Plan: SNF    Current Diagnoses: Patient Active Problem List   Diagnosis Date Noted   Cerebrovascular disease 06/21/2024   Severe sepsis with end organ damage due to complicated UTI(HCC) 06/20/2024   Blind 10/25/2022   History of COPD 10/24/2022   Pulmonary emphysema (HCC) 10/23/2022   Chronic diastolic CHF (congestive heart failure) (HCC) 10/23/2022   Macrocytic anemia with vitamin B12 deficiency 10/22/2022   Bilateral lower extremity edema 08/19/2022   Sepsis (HCC) 05/19/2022   Diverticulitis    Pneumonia of both lower lobes due to infectious organism 05/18/2022   UTI (urinary tract infection) 05/18/2022   Hypokalemia 05/18/2022   Acute encephalopathy 05/18/2022   Persistent atrial fibrillation with RVR 09/23/2021   Secondary hypercoagulable state 09/23/2021   SAH (subarachnoid hemorrhage) (HCC) 10/29/2019   Chronic anticoagulation 10/29/2019   Sick sinus syndrome (HCC)    Cerebrovascular accident (CVA) due to embolism of left middle cerebral artery (HCC) 05/22/2015   Receptive dysphasia 05/21/2015   Decreased cardiac ejection fraction 07/03/2012   AKI (acute kidney injury) 05/16/2012   Atrial fibrillation with rapid ventricular response (HCC) 05/15/2012   Shoulder pain, bilateral 05/15/2012    Orientation  RESPIRATION BLADDER Height & Weight     Self, Situation, Place, Time  Normal (Room Air) Continent, Indwelling catheter Weight: 195 lb 1.7 oz (88.5 kg) Height:  5' 9 (175.3 cm)  BEHAVIORAL SYMPTOMS/MOOD NEUROLOGICAL BOWEL NUTRITION STATUS      Incontinent Diet (Please see discharge summary)  AMBULATORY STATUS COMMUNICATION OF NEEDS Skin   Extensive Assist Verbally Normal                       Personal Care Assistance Level of Assistance  Feeding, Bathing, Dressing Bathing Assistance: Maximum assistance Feeding assistance: Maximum assistance Dressing Assistance: Maximum assistance     Functional Limitations Info  Sight Sight Info: Impaired (R and L)        SPECIAL CARE FACTORS FREQUENCY  PT (By licensed PT), OT (By licensed OT)     PT Frequency: 5x OT Frequency: 5x            Contractures Contractures Info: Not present    Additional Factors Info  Code Status, Allergies Code Status Info: DNR-LIMITED -Do Not Intubate/DNI Allergies Info: NKA           Current Medications (06/22/2024):  This is the current hospital active medication list Current Facility-Administered Medications  Medication Dose Route Frequency Provider Last Rate Last Admin   acetaminophen  (TYLENOL ) tablet 650 mg  650 mg Oral Q6H PRN Danton Reyes DASEN, MD       albuterol  (PROVENTIL ) (2.5 MG/3ML) 0.083% nebulizer solution 2.5 mg  2.5 mg Nebulization Q4H PRN Segars, Jonathan, MD       NOREEN ON 06/23/2024] cefTRIAXone  (ROCEPHIN ) 2 g in sodium chloride  0.9 % 100 mL  IVPB  2 g Intravenous Q24H Danford, Lonni SQUIBB, MD       Chlorhexidine  Gluconate Cloth 2 % PADS 6 each  6 each Topical Daily Segars, Dorn, MD   6 each at 06/21/24 1614   lactated ringers  infusion   Intravenous Continuous Danford, Lonni SQUIBB, MD       melatonin tablet 6 mg  6 mg Oral QHS PRN Segars, Dorn, MD       metoCLOPramide (REGLAN) injection 5 mg  5 mg Intravenous Q6H PRN Danton Reyes DASEN, MD       ondansetron   (ZOFRAN ) injection 4 mg  4 mg Intravenous Q6H PRN Danton Reyes DASEN, MD   4 mg at 06/20/24 1531   Oral care mouth rinse  15 mL Mouth Rinse PRN Danford, Lonni SQUIBB, MD       polyethylene glycol (MIRALAX  / GLYCOLAX ) packet 17 g  17 g Oral Daily PRN Keturah Dorn, MD       sodium chloride  flush (NS) 0.9 % injection 3 mL  3 mL Intravenous Q12H Segars, Jonathan, MD   3 mL at 06/22/24 0954   umeclidinium bromide (INCRUSE ELLIPTA) 62.5 MCG/ACT 1 puff  1 puff Inhalation Daily Segars, Jonathan, MD   1 puff at 06/22/24 1105     Discharge Medications: Please see discharge summary for a list of discharge medications.  Relevant Imaging Results:  Relevant Lab Results:   Additional Information SSN: [id number]  Lauraine FORBES Saa, LCSWA

## 2024-06-23 DIAGNOSIS — R6520 Severe sepsis without septic shock: Secondary | ICD-10-CM | POA: Diagnosis not present

## 2024-06-23 DIAGNOSIS — A419 Sepsis, unspecified organism: Secondary | ICD-10-CM | POA: Diagnosis not present

## 2024-06-23 LAB — URINE CULTURE: Culture: >=100000 — AB

## 2024-06-23 LAB — COMPREHENSIVE METABOLIC PANEL WITH GFR
ALT: 115 U/L — ABNORMAL HIGH (ref 0–44)
AST: 64 U/L — ABNORMAL HIGH (ref 15–41)
Albumin: 2.4 g/dL — ABNORMAL LOW (ref 3.5–5.0)
Alkaline Phosphatase: 164 U/L — ABNORMAL HIGH (ref 38–126)
Anion gap: 14 (ref 5–15)
BUN: 22 mg/dL (ref 8–23)
CO2: 23 mmol/L (ref 22–32)
Calcium: 8.3 mg/dL — ABNORMAL LOW (ref 8.9–10.3)
Chloride: 104 mmol/L (ref 98–111)
Creatinine, Ser: 1.5 mg/dL — ABNORMAL HIGH (ref 0.61–1.24)
GFR, Estimated: 41 mL/min — ABNORMAL LOW (ref >60)
Glucose, Bld: 109 mg/dL — ABNORMAL HIGH (ref 70–99)
Potassium: 3.3 mmol/L — ABNORMAL LOW (ref 3.5–5.1)
Sodium: 141 mmol/L (ref 135–145)
Total Bilirubin: 2.1 mg/dL — ABNORMAL HIGH (ref 0.0–1.2)
Total Protein: 5.5 g/dL — ABNORMAL LOW (ref 6.5–8.1)

## 2024-06-23 LAB — CULTURE, BLOOD (ROUTINE X 2)

## 2024-06-23 MED ORDER — POTASSIUM CHLORIDE 2 MEQ/ML IV SOLN
INTRAVENOUS | Status: DC
  Filled 2024-06-23 (×3): qty 1000

## 2024-06-23 MED ORDER — METOPROLOL TARTRATE 25 MG PO TABS
25.0000 mg | ORAL_TABLET | Freq: Two times a day (BID) | ORAL | Status: DC
  Administered 2024-06-23 – 2024-06-25 (×5): 25 mg via ORAL
  Filled 2024-06-23 (×6): qty 1

## 2024-06-23 MED ORDER — POTASSIUM CHLORIDE 20 MEQ PO PACK
40.0000 meq | PACK | Freq: Once | ORAL | Status: AC
  Administered 2024-06-23: 40 meq via ORAL
  Filled 2024-06-23: qty 2

## 2024-06-23 MED ORDER — METOPROLOL TARTRATE 5 MG/5ML IV SOLN
5.0000 mg | Freq: Once | INTRAVENOUS | Status: AC
  Administered 2024-06-23: 5 mg via INTRAVENOUS
  Filled 2024-06-23: qty 5

## 2024-06-23 MED ORDER — METOPROLOL TARTRATE 5 MG/5ML IV SOLN
5.0000 mg | Freq: Once | INTRAVENOUS | Status: DC
  Administered 2024-06-23: 5 mg via INTRAVENOUS
  Filled 2024-06-23: qty 5

## 2024-06-23 NOTE — Progress Notes (Signed)
 Patient being transferred to 6 N room 8 report called to Latonya. Patient is in afib with RVR and is going to a telemetry floor.

## 2024-06-23 NOTE — Care Plan (Signed)
 Patient arrived in the unit via bed. VS taken and recorded. Needs attended. Patient was seen MD at bedside with new orders made and carried out

## 2024-06-23 NOTE — Plan of Care (Signed)
   Problem: Education: Goal: Knowledge of General Education information will improve Description Including pain rating scale, medication(s)/side effects and non-pharmacologic comfort measures Outcome: Progressing

## 2024-06-23 NOTE — Progress Notes (Addendum)
 Patient resting in bed, fluids will be started. EKG was ordered by doctor. Patient compliant to care

## 2024-06-23 NOTE — Progress Notes (Signed)
  Progress Note   Patient: Justin Baker FMW:[id number] DOB: 20-Apr-1922 DOA: 06/19/2024     3 DOS: the patient was seen and examined on 06/23/2024       Brief hospital course: 89 y.o. M with dCHF, hx CVA, SSS s/p PPM, AF not on AC, CKD IIIa baseline Cr 1.3 and recent admission to [facility] for sepsis from pseudomonal bacteremia who returns for flank pain, again found to have sepsis, urinary source.     Assessment and Plan: * Severe sepsis with end organ damage due to complicated UTI(HCC) Presented with leukocytosis, tachypnea, hypotension, found to have AKI cr up to 1.8, Tbili 3.1, lactate 3, and thrombocytopenia.  CT confirms intra renal stone, right hydroureter without obstruction, consistent with R flank pain.  Recent Pseudomonas stutzeri and Actinotignum schaali bacteremia at [facility]. - Continue rocephin  - Hold metoprolol  furosemide  - Continue IV fluids - Hold Lipitor due to septic liver injury  AKI (acute kidney injury) Chronic kidney disease stage IIIa baseline 1.2 CKD IIIb ruled out, Cr in outside records consistently 1.2-1.3.  Here 1.8 on admission.  CT shows no frank obstruction although trabeculations to me suggest some chronic retention. - IV fluids - Maintain foley decompression - Trend Cr  Sick sinus syndrome (HCC) Afib RVR Pacemaker in place Went into Afib today - Reusme metoprolol   Cerebrovascular disease - Hold Lipitor given transaminitis  Chronic diastolic CHF (congestive heart failure) (HCC) Appears euvolemic - Hold lasix  - Continue IV fluids  History of COPD No wheezing on exam - Continue Incruse            Subjective: Feeling vague malaise this morning due to tachycardia in the 130s.  After heart rate control, aptietn feeling better.  No fever.     Physical Exam: BP 108/62 (BP Location: Left Arm)   Pulse 66   Temp (!) 97.5 F (36.4 C) (Oral)   Resp 17   Ht 5' 9 (1.753 m)   Wt 88.5 kg   SpO2 95%   BMI 28.81 kg/m   General: Pt  sluggish, weak, malaise, no fever. Cardiovascular: tachycardic, irregular, nl S1-S2, no murmurs appreciated.   No LE edema.   Respiratory: Normal respiratory rate and rhythm.  CTAB without rales or wheezes. Abdominal: Abdomen soft and non-tender.  No distension or HSM.   Neuro/Psych: Strength symmetric in upper and lower extremities.  Judgment and insight appear impaired but at baseline.   Data Reviewed: BMP shows hypokalemia, treated Cr only down to 1.5   Family communication: Called to daughter in law, no answer   Disposition: Status is: Inpatient         Author: Lonni SHAUNNA Dalton, MD 06/23/2024 6:08 PM  For on call review [email or web address]

## 2024-06-23 NOTE — Plan of Care (Signed)

## 2024-06-24 DIAGNOSIS — A419 Sepsis, unspecified organism: Secondary | ICD-10-CM | POA: Diagnosis not present

## 2024-06-24 DIAGNOSIS — R6520 Severe sepsis without septic shock: Secondary | ICD-10-CM | POA: Diagnosis not present

## 2024-06-24 LAB — CBC
HCT: 37.4 % — ABNORMAL LOW (ref 39.0–52.0)
Hemoglobin: 12 g/dL — ABNORMAL LOW (ref 13.0–17.0)
MCH: 33.7 pg (ref 26.0–34.0)
MCHC: 32.1 g/dL (ref 30.0–36.0)
MCV: 105.1 fL — ABNORMAL HIGH (ref 80.0–100.0)
Platelets: 133 K/uL — ABNORMAL LOW (ref 150–400)
RBC: 3.56 MIL/uL — ABNORMAL LOW (ref 4.22–5.81)
RDW: 13.9 % (ref 11.5–15.5)
WBC: 8.2 K/uL (ref 4.0–10.5)
nRBC: 0 % (ref 0.0–0.2)

## 2024-06-24 LAB — COMPREHENSIVE METABOLIC PANEL WITH GFR
ALT: 74 U/L — ABNORMAL HIGH (ref 0–44)
AST: 33 U/L (ref 15–41)
Albumin: 2.2 g/dL — ABNORMAL LOW (ref 3.5–5.0)
Alkaline Phosphatase: 159 U/L — ABNORMAL HIGH (ref 38–126)
Anion gap: 12 (ref 5–15)
BUN: 19 mg/dL (ref 8–23)
CO2: 20 mmol/L — ABNORMAL LOW (ref 22–32)
Calcium: 8 mg/dL — ABNORMAL LOW (ref 8.9–10.3)
Chloride: 108 mmol/L (ref 98–111)
Creatinine, Ser: 1.43 mg/dL — ABNORMAL HIGH (ref 0.61–1.24)
GFR, Estimated: 44 mL/min — ABNORMAL LOW (ref >60)
Glucose, Bld: 110 mg/dL — ABNORMAL HIGH (ref 70–99)
Potassium: 4 mmol/L (ref 3.5–5.1)
Sodium: 140 mmol/L (ref 135–145)
Total Bilirubin: 1.2 mg/dL (ref 0.0–1.2)
Total Protein: 5.2 g/dL — ABNORMAL LOW (ref 6.5–8.1)

## 2024-06-24 MED ORDER — CEFADROXIL 500 MG PO CAPS
500.0000 mg | ORAL_CAPSULE | Freq: Every day | ORAL | Status: DC
Start: 2024-06-25 — End: 2024-07-03
  Administered 2024-06-25 – 2024-06-26 (×2): 500 mg via ORAL
  Filled 2024-06-24 (×2): qty 1

## 2024-06-24 NOTE — Progress Notes (Signed)
  Progress Note   Patient: Justin Baker FMW:[id number] DOB: [birth year] DOA: 06/19/2024     4 DOS: the patient was seen and examined on 06/24/2024 at 11:07AM      Brief hospital course: 89 y.o. M with dCHF, hx CVA, SSS s/p PPM, AF not on AC, CKD IIIa baseline Cr 1.3 and recent admission to [facility] for sepsis from pseudomonal bacteremia who returns for flank pain, again found to have sepsis, urinary source.     Assessment and Plan: * Severe sepsis with end organ damage due to complicated UTI(HCC) Presented with leukocytosis, tachypnea, hypotension, found to have AKI cr up to 1.8, Tbili 3.1, lactate 3, and thrombocytopenia.  CT confirms intra renal stone, right hydroureter without obstruction, consistent with R flank pain.  Recent Pseudomonas stutzeri and Actinotignum schaali bacteremia at [facility].  Urine culture here with Ecoli, blood culture with contaminant only, not likely infection. - Continue Rocephin  day 5, transition today to cefadroxil to complete 14 day course - Follow up with Urology   Persistent atrial fibrillation with RVR Not on anticoagulation, unclear reason.  On hospital day 3, went into rapid Afib, metoprolol  resumed, now rate controlled - Continue metoprolol   AKI (acute kidney injury) Chronic kidney disease stage IIIa baseline 1.2 CKD IIIb ruled out, Cr in outside records consistently 1.2-1.3.  Here 1.8 on admission.  CT shows no frank obstruction although trabeculations to me suggest some chronic retention. - Maintain foley, follow up with Urology within 1 week  Sick sinus syndrome [facility]) Pacemaker in place PPM battery will expire within 1 month.  Discussed with family. - Resume metoprolol   Cerebrovascular disease - Hold Lipitor given transaminitis  Chronic diastolic CHF (congestive heart failure) (HCC) Appears euvolemic.  Lasix  is PRN at home - Hold Lasix   History of COPD No wheezing on exam - Continue LAMA          Subjective: Doing well. No  malaise, no fever.  No chest pain, no dyspnea.  No more flank pain, no vomiting.     Physical Exam: BP 119/74   Pulse 75   Temp 97.6 F (36.4 C) (Oral)   Resp 18   Ht 5' 9 (1.753 m)   Wt 83 kg   SpO2 97%   BMI 27.02 kg/m   General: Pt is alert, awake, not in acute distress, appears younger than stated age Cardiovascular: Irregular, rate controlled, nl S1-S2, no murmurs appreciated.   No LE edema.   Respiratory: Normal respiratory rate and rhythm.  CTAB without rales or wheezes. Abdominal: Abdomen soft and non-tender.  No distension or HSM.   Neuro/Psych: Strength symmetric in upper and lower extremities.  Judgment and insight appear normal for age, very hard of hearing.   Data Reviewed: BMP shows Cr down to 1.4, CBC shows mild thrombocytopenia, leukocytosis resolved.      Family Communication: Daughter in social worker by phone    Disposition: Status is: Inpatient         Author: Lonni SHAUNNA Dalton, MD 06/24/2024 3:36 PM  For on call review [email or web address]

## 2024-06-24 NOTE — Plan of Care (Signed)
  Problem: Elimination: Goal: Will not experience complications related to urinary retention 06/24/2024 0504 by Arna Rhymes, RN Outcome: Progressing 06/24/2024 0504 by Arna Rhymes, RN Reactivated   Problem: Pain Managment: Goal: General experience of comfort will improve and/or be controlled 06/24/2024 0504 by Arna Rhymes, RN Outcome: Progressing 06/24/2024 0504 by Arna Rhymes, RN Reactivated   Problem: Safety: Goal: Ability to remain free from injury will improve 06/24/2024 0504 by Arna Rhymes, RN Outcome: Progressing 06/24/2024 0504 by Arna Rhymes, RN Reactivated

## 2024-06-24 NOTE — TOC Progression Note (Addendum)
 Transition of Care [facility]) - Progression Note    Patient Details  Name: Justin Baker MRN: [id number] Date of Birth: 14-Jul-1922  Transition of Care [facility]) CM/SW Contact  Isaiah Public, LCSWA Phone Number: 06/24/2024, 12:12 PM  Clinical Narrative:     CSW received consult for possible SNF placement at time of discharge.Due to patients orientation CSW spoke with patients daughter nlaw christy regarding PT recommendation of SNF placement at time of discharge. Patients daugher n law reports PTA patient comes from [facility].  Patients daughter n law expressed understanding of PT recommendation and is agreeable for patient to return to [facility] and receive short term rehab. CSW discussed insurance authorization process. CSW called and left [employer] a voicemail. CSW awaiting call back to start insurance authorization for Snf and PTAR. All questions answered. No further questions reported at this time. CSW to continue to follow and assist with discharge planning needs.   Randine with [facility] confirmed that if patient is not approved for short term rehab that facility will be able to accept patient back. CSW awaiting to hear back from [facility] to start auth for SNF and PTAR.  Update- CSW received call back from [place] with [facility]. CSW requested to start insurance authorization for SNF and PTAR. Insurance authorization currently pending for SNF and PTAR.  Expected Discharge Plan: Long Term Nursing Home Barriers to Discharge: Continued Medical Work up               Expected Discharge Plan and Services In-house Referral: Clinical Social Work   Post Acute Care Choice: Skilled Nursing Facility Living arrangements for the past 2 months: Skilled Nursing Facility, Single Family Home Expected Discharge Date: 06/23/24                                     Social Drivers of Health (SDOH) Interventions SDOH Screenings   Food Insecurity: Low Risk  (11/07/2023)   Received from [facility]  Housing: Low  Risk  (11/07/2023)   Received from [facility]  Transportation Needs: No Transportation Needs (11/07/2023)   Received from [facility]  Utilities: Low Risk  (11/07/2023)   Received from [facility]  Depression (PHQ2-9): Low Risk  (08/19/2022)  Financial Resource Strain: Low Risk  (02/14/2023)  Physical Activity: Unknown (02/14/2023)  Social Connections: Unknown (02/14/2023)  Stress: No Stress Concern Present (02/14/2023)  Tobacco Use: Medium Risk (04/04/2024)   Received from [facility]    Readmission Risk Interventions     No data to display

## 2024-06-25 ENCOUNTER — Encounter (HOSPITAL_COMMUNITY): Payer: Self-pay | Admitting: Internal Medicine

## 2024-06-25 DIAGNOSIS — R6520 Severe sepsis without septic shock: Secondary | ICD-10-CM | POA: Diagnosis not present

## 2024-06-25 DIAGNOSIS — A419 Sepsis, unspecified organism: Secondary | ICD-10-CM | POA: Diagnosis not present

## 2024-06-25 LAB — CULTURE, BLOOD (ROUTINE X 2)
Culture: NO GROWTH
Special Requests: ADEQUATE

## 2024-06-25 MED ORDER — INFLUENZA VAC SPLIT HIGH-DOSE 0.5 ML IM SUSY
0.5000 mL | PREFILLED_SYRINGE | INTRAMUSCULAR | Status: DC
  Filled 2024-06-25: qty 0.5

## 2024-06-25 NOTE — Plan of Care (Signed)
  Problem: Elimination: Goal: Will not experience complications related to urinary retention Outcome: Progressing   Problem: Pain Managment: Goal: General experience of comfort will improve and/or be controlled Outcome: Progressing   Problem: Safety: Goal: Ability to remain free from injury will improve Outcome: Progressing

## 2024-06-25 NOTE — TOC Progression Note (Signed)
 Transition of Care [facility]) - Progression Note    Patient Details  Name: Justin Baker MRN: [id number] Date of Birth: 1922/06/21  Transition of Care [facility]) CM/SW Contact  Jaquel Glassburn [name] [name], [place] Phone Number: 06/25/2024, 9:36 AM  Clinical Narrative:    CSW called [employer] to inquire about pt auth. [employer] stated it is still under review and has not been reviewed by the medical team just yet. CSW will continue to follow.   Expected Discharge Plan: Long Term Nursing Home Barriers to Discharge: Continued Medical Work up               Expected Discharge Plan and Services In-house Referral: Clinical Social Work   Post Acute Care Choice: Skilled Nursing Facility Living arrangements for the past 2 months: Skilled Nursing Facility, Single Family Home Expected Discharge Date: 06/23/24                                     Social Drivers of Health (SDOH) Interventions SDOH Screenings   Food Insecurity: Low Risk  (11/07/2023)   Received from [facility]  Housing: Low Risk  (11/07/2023)   Received from [facility]  Transportation Needs: No Transportation Needs (11/07/2023)   Received from [facility]  Utilities: Low Risk  (11/07/2023)   Received from [facility]  Depression (PHQ2-9): Low Risk  (08/19/2022)  Financial Resource Strain: Low Risk  (02/14/2023)  Physical Activity: Unknown (02/14/2023)  Social Connections: Unknown (02/14/2023)  Stress: No Stress Concern Present (02/14/2023)  Tobacco Use: Medium Risk (04/04/2024)   Received from [facility]    Readmission Risk Interventions     No data to display

## 2024-06-25 NOTE — Progress Notes (Signed)
 Mobility Specialist Progress Note:    06/25/24 1252  Mobility  Activity Pivoted/transferred to/from [facility]  Level of Assistance Moderate assist, patient does 50-74%  Assistive Device Front wheel walker  Distance Ambulated (ft) 3 ft  Activity Response Tolerated well  Mobility Referral Yes  Mobility visit 1 Mobility  Mobility Specialist Start Time (ACUTE ONLY) 1153  Mobility Specialist Stop Time (ACUTE ONLY) 1212  Mobility Specialist Time Calculation (min) (ACUTE ONLY) 19 min   Received pt in bed and LPN requesting assistance to [facility]. Pt required [facility]A to [facility]. No c/o. LPN performed pericare. Left pt in bed. All needs met. LPN present.  Lavanda Pollack Mobility Specialist  Please contact via [employer] or  Rehab Office [phone]

## 2024-06-25 NOTE — Progress Notes (Signed)
  Progress Note   Patient: Justin Baker FMW:[id number] DOB: 1921-11-08 DOA: 06/19/2024     5 DOS: the patient was seen and examined on 06/25/2024        Brief hospital course: 89 y.o. M with dCHF, hx CVA, SSS s/p PPM, AF not on AC, CKD IIIa baseline Cr 1.3 and recent admission to [facility] for sepsis from pseudomonal bacteremia who returns for flank pain, again found to have sepsis, urinary source.     Assessment and Plan: * Severe sepsis with end organ damage due to complicated UTI(HCC) - Continue cefadroxil for 14 day course - Urology after discharge    Persistent atrial fibrillation with RVR - Continue metoprolol   AKI (acute kidney injury) Chronic kidney disease stage IIIa baseline 1.2 - Maintain foley at discharge  Sick sinus syndrome (HCC) Pacemaker in place HR improved - Continue metoprolol   Cerebrovascular disease - Hold Lipitor given transaminitis  Chronic diastolic CHF (congestive heart failure) (HCC) Appears euvolemic.  Lasix  is PRN at home - Hold Lasix   History of COPD No wheezing on exam - Continue LAMA          Subjective: Doing well, no new change, no nursing concerns.     Physical Exam: BP (!) 121/99   Pulse 88   Temp 98.1 F (36.7 C) (Oral)   Resp 16   Ht 5' 9 (1.753 m)   Wt 82.3 kg   SpO2 96%   BMI 26.79 kg/m   Elderly adult male, sitting up in bed, trying to stand to pivot to the bedside commode, requires two-person assistance Irregularly irregular, no murmurs, rate controlled, no pitting in the lower extremities Respiratory rate normal, lungs clear without rales or wheezes Abdomen soft, no tenderness palpation or guarding, no ascites or distention Very hard of hearing, but face symmetric, speech fluent, strength appears symmetric    Data Reviewed:        Family Communication:      Disposition: Status is: Inpatient         Author: Lonni SHAUNNA Dalton, MD 06/25/2024 6:37 PM  For on call review  [email or web address].

## 2024-06-26 DIAGNOSIS — R6520 Severe sepsis without septic shock: Secondary | ICD-10-CM | POA: Diagnosis not present

## 2024-06-26 DIAGNOSIS — A419 Sepsis, unspecified organism: Secondary | ICD-10-CM | POA: Diagnosis not present

## 2024-06-26 MED ORDER — FUROSEMIDE 20 MG PO TABS: 20.0000 mg | ORAL_TABLET | Freq: Every day | ORAL | Status: DC

## 2024-06-26 MED ORDER — METOPROLOL TARTRATE 50 MG PO TABS
50.0000 mg | ORAL_TABLET | Freq: Two times a day (BID) | ORAL | Status: DC
  Filled 2024-06-26: qty 1

## 2024-06-26 MED ORDER — CEFADROXIL 500 MG PO CAPS: 500.0000 mg | ORAL_CAPSULE | Freq: Every day | ORAL | 0 refills | Status: AC

## 2024-06-26 MED ORDER — METOPROLOL TARTRATE 50 MG PO TABS: 50.0000 mg | ORAL_TABLET | Freq: Two times a day (BID) | ORAL | Status: DC

## 2024-06-26 NOTE — Plan of Care (Signed)
  Problem: Nutrition: Goal: Adequate nutrition will be maintained 06/26/2024 0616 by Arna Rhymes, RN Outcome: Progressing 06/26/2024 0616 by Arna Rhymes, RN Reactivated   Problem: Elimination: Goal: Will not experience complications related to urinary retention Outcome: Progressing   Problem: Safety: Goal: Ability to remain free from injury will improve Outcome: Progressing

## 2024-06-26 NOTE — TOC Progression Note (Addendum)
 Transition of Care [facility]) - Progression Note    Patient Details  Name: Justin Baker MRN: [id number] Date of Birth: 01-19-1922  Transition of Care [facility]) CM/SW Contact  Brittania Sudbeck [name] [name], [place] Phone Number: 06/26/2024, 12:12 PM  Clinical Narrative:    CSW received insurance auth approval for [name]. Effective 11/3-11/8 auth ID [id number]. CSW confirmed bed availability, pt can admit following DC from hospital. CSW will continue to follow.   Expected Discharge Plan: Long Term Nursing Home Barriers to Discharge: Continued Medical Work up               Expected Discharge Plan and Services In-house Referral: Clinical Social Work   Post Acute Care Choice: Skilled Nursing Facility Living arrangements for the past 2 months: Skilled Nursing Facility, Single Family Home Expected Discharge Date: 06/23/24                                     Social Drivers of Health (SDOH) Interventions SDOH Screenings   Food Insecurity: Low Risk  (11/07/2023)   Received from [facility]  Housing: Low Risk  (11/07/2023)   Received from [facility]  Transportation Needs: No Transportation Needs (11/07/2023)   Received from [facility]  Utilities: Low Risk  (11/07/2023)   Received from [facility]  Depression (PHQ2-9): Low Risk  (08/19/2022)  Financial Resource Strain: Low Risk  (02/14/2023)  Physical Activity: Unknown (02/14/2023)  Social Connections: Unknown (02/14/2023)  Stress: No Stress Concern Present (02/14/2023)  Tobacco Use: Medium Risk (06/25/2024)    Readmission Risk Interventions     No data to display

## 2024-06-26 NOTE — Progress Notes (Signed)
 Occupational Therapy Treatment Patient Details Name: Justin Baker MRN: [id number] DOB: [birth year] Today's Date: 06/26/2024   History of present illness Pt is a 89 y.o. male who presented 06/20/24 from nursing home due to abdominal pain. Pt found to have sepsis from complicated UTI. PMH:  A-fib, SSS, PPM, HFpEF, CVA, SAH, COPD, CKD3b, recent hx of Pseudomonas stutzeri and Actinotignum schaalii bacteremia   OT comments  Pt was sleeping in bed but agreeable to get OOB to chair. Pt completed bed mobility with CGA to min assist and sit to stand transfer with min-mod assist to RW and was able to step pivot to chair with CGA-min assist. He then completed oral and hair care with set up to min assist as noted decrease in depth perception to items. Patient will benefit from continued inpatient follow up therapy, <3 hours/day.      If plan is discharge home, recommend the following:  A lot of help with walking and/or transfers;A lot of help with bathing/dressing/bathroom;Assistance with cooking/housework;Assistance with feeding;Direct supervision/assist for medications management;Direct supervision/assist for financial management;Assist for transportation   Equipment Recommendations   (TBD at next venue)    Recommendations for Other Services      Precautions / Restrictions Precautions Precautions: Fall;Other (comment) Restrictions Weight Bearing Restrictions Per Provider Order: No       Mobility Bed Mobility Overal bed mobility: Needs Assistance Bed Mobility: Supine to Sit     Supine to sit: Contact guard, Min assist     General bed mobility comments: extra time and liked when end of bed pulled in to use to pull hips to EOB    Transfers Overall transfer level: Needs assistance Equipment used: Rolling walker (2 wheels) Transfers: Sit to/from Stand Sit to Stand: Min assist, Mod assist     Step pivot transfers: Contact guard assist     General transfer comment: Pt likes to pull up  on walker and needs assist to decrease in posterio tilt with standing     Balance Overall balance assessment: Needs assistance Sitting-balance support: Feet supported Sitting balance-Leahy Scale: Good     Standing balance support: Bilateral upper extremity supported Standing balance-Leahy Scale: Poor Standing balance comment: reliant on UE support  and requires intial assist with stabilizing walker                           ADL either performed or assessed with clinical judgement   ADL Overall ADL's : Needs assistance/impaired Eating/Feeding: Set up;Minimal assistance;Sitting Eating/Feeding Details (indicate cue type and reason): decrease in vision/FM Grooming: Wash/dry face;Oral care;Set up;Sitting;Contact guard assist   Upper Body Bathing: Contact guard assist;Sitting;Minimal assistance   Lower Body Bathing: Maximal assistance;Sit to/from stand   Upper Body Dressing : Contact guard assist;Sitting   Lower Body Dressing: Maximal assistance;Sit to/from stand   Toilet Transfer: Contact guard assist;BSC/3in1;Rolling walker (2 wheels)   Toileting- Clothing Manipulation and Hygiene: Maximal assistance;Sit to/from stand       Functional mobility during ADLs: Contact guard assist;Rolling walker (2 wheels);Cueing for sequencing;Cueing for safety      Extremity/Trunk Assessment Upper Extremity Assessment Upper Extremity Assessment: Generalized weakness;Difficult to assess due to impaired cognition (Pt noted to have decrease in FM coordination but also deacease in depth perception as well. He has limited AROM in BUE but WFL to complete hair care and UE dressing)            Vision   Vision Assessment?: Vision impaired- to be further tested in  functional context Additional Comments: noted decrease in vision but unclear if they have glasses   Perception     Praxis     Communication Communication Communication: Impaired Factors Affecting Communication: Hearing  impaired   Cognition Arousal: Alert Behavior During Therapy: WFL for tasks assessed/performed Cognition: History of cognitive impairments, No family/caregiver present to determine baseline             OT - Cognition Comments: Pt noted to have increase in cognition from last session as able to follow one step cues with increase in follow though. Pt however is limited due to [facility]                 Following commands: Impaired Following commands impaired: Follows multi-step commands with increased time, Follows multi-step commands inconsistently      Cueing   Cueing Techniques: Verbal cues, Tactile cues  Exercises      Shoulder Instructions       General Comments      Pertinent Vitals/ Pain       Pain Assessment Pain Assessment: No/denies pain Faces Pain Scale: No hurt Pain Intervention(s): Limited activity within patient's tolerance  Home Living                                          Prior Functioning/Environment              Frequency  Min 2X/week        Progress Toward Goals  OT Goals(current goals can now be found in the care plan section)  Progress towards OT goals: Progressing toward goals  Acute Rehab OT Goals Patient Stated Goal: to get better OT Goal Formulation: With patient Time For Goal Achievement: 07/05/24 Potential to Achieve Goals: Fair ADL Goals Pt Will Perform Grooming: sitting;with supervision Pt Will Perform Upper Body Bathing: with supervision;sitting Pt Will Perform Lower Body Bathing: with mod assist;sit to/from stand Pt Will Perform Upper Body Dressing: with contact guard assist;sitting Pt Will Perform Lower Body Dressing: with mod assist;sit to/from stand Pt Will Transfer to Toilet: with min assist;ambulating  Plan      Co-evaluation                 AM-PAC OT 6 Clicks Daily Activity     Outcome Measure   Help from another person eating meals?: A Little Help from another person taking  care of personal grooming?: A Little Help from another person toileting, which includes using toliet, bedpan, or urinal?: A Lot Help from another person bathing (including washing, rinsing, drying)?: A Lot Help from another person to put on and taking off regular upper body clothing?: A Little Help from another person to put on and taking off regular lower body clothing?: A Lot 6 Click Score: 15    End of Session Equipment Utilized During Treatment: Gait belt;Rolling walker (2 wheels)  OT Visit Diagnosis: Unsteadiness on feet (R26.81);Other abnormalities of gait and mobility (R26.89);Repeated falls (R29.6);Muscle weakness (generalized) (M62.81)   Activity Tolerance Patient tolerated treatment well   Patient Left in chair;with call bell/phone within reach;with chair alarm set   Nurse Communication Mobility status        Time: [phone] OT Time Calculation (min): 30 min  Charges: OT General Charges $OT Visit: 1 Visit OT Treatments $Self Care/Home Management : 23-37 mins  Warrick POUR OTR/L  Acute Rehab Services  [phone] office number  Warrick Berber 06/26/2024, 9:37 AM

## 2024-06-26 NOTE — Discharge Summary (Addendum)
 Physician Discharge Summary   Patient: Justin Baker MRN: [id number] DOB: 11-19-1921  Admit date:     06/19/2024  Discharge date: 06/26/24  Discharge Physician: Lonni SHAUNNA Dalton   PCP: Almarie Waddell NOVAK, NP     Recommendations at discharge:  [facility]: Please refer to [facility] within 1 week for kidney stone and indwelling foley [facility]: See below regarding bladder outlet obstruction [facility]: Increase metoprolol  [facility]: Check BMP and LFTs in 1 week and resume Lipitor if appropriate     Discharge Diagnoses: Principal Problem:   Severe sepsis with end organ damage due to complicated UTI(HCC) Active Problems:   Persistent atrial fibrillation with RVR   AKI (acute kidney injury)   Sick sinus syndrome (HCC)   History of COPD   Chronic diastolic CHF (congestive heart failure) (HCC)   Cerebrovascular disease      Hospital Course: 89 y.o. M with dCHF, hx CVA, SSS s/p PPM, AF not on AC, CKD IIIa baseline Cr 1.3 and recent admission to [facility] for sepsis from pseudomonal bacteremia who returns for flank pain, again found to have sepsis, urinary source.        * Severe sepsis with end organ damage due to complicated UTI(HCC) Presented with leukocytosis, tachypnea, hypotension, found to have AKI cr up to 1.8, Tbili 3.1, lactate 3, and thrombocytopenia.  CT confirms intra renal stone, right hydroureter without obstruction, consistent with R flank pain.  Recent Pseudomonas stutzeri and Actinotignum schaali bacteremia at [facility].  Urine culture here with Louana, transitioned to cefadroxil (dosed by pharmacy) to complete 14 day course, EOT 11/14.      Bladder outlet obstruction CT imaging last admission and this admission show trabeculated bladder and hydroureter without obvious obstruction.  Query if this is related to bladder outlet obstruction.    Recommending indwelling foley for now, close follow up with Urology and defer cystoscopy/urodynamics to their  expertise.   Persistent atrial fibrillation with RVR Not on anticoagulation, unclear reason.  On hospital day 3, went into rapid Afib, metoprolol  resumed, now rate controlled, dose increased at discharge.     AKI (acute kidney injury) Chronic kidney disease stage IIIa baseline 1.2 CKD IIIb ruled out, Cr in outside records consistently 1.2-1.3.  Here 1.8 on admission.  CT showed no frank obstruction although trabeculations to me suggest some chronic retention. - Maintain foley, follow up with Urology within 1 week  Sick sinus syndrome [facility]) Pacemaker in place PPM battery will expire within 1 month.  Discussed with family.  Cerebrovascular disease Lipitor held given transaminitis.  Transaminitis Due to sepsis.  Trend after discharge.  Chronic diastolic CHF (congestive heart failure) (HCC) Lasix  appears to be PRN at home, no evidence of fluid overload here  History of COPD No wheezing on exam - Continue LAMA            The [place]  Controlled Substances Registry was reviewed for this patient prior to discharge.  Consultants: None   Disposition: Skilled nursing facility Diet recommendation:  Discharge Diet Orders (From admission, onward)     Start     Ordered   06/26/24 0000  Diet - low sodium heart healthy        06/26/24 1329             DISCHARGE MEDICATION: Allergies as of 06/26/2024   No Known Allergies      Medication List     PAUSE taking these medications    atorvastatin  40 MG tablet Wait to take this until  your doctor or other care provider tells you to start again. Commonly known as: LIPITOR Take 1 tablet by mouth once daily   furosemide  20 MG tablet Wait to take this until your doctor or other care provider tells you to start again. Commonly known as: LASIX  Take 1 tablet (20 mg total) by mouth daily. What changed:  when to take this reasons to take this       TAKE these medications    acetaminophen  500 MG  tablet Commonly known as: TYLENOL  Take 500 mg by mouth every 6 (six) hours as needed for mild pain (pain score 1-3).   barrier cream Crea Commonly known as: non-specified Apply 1 Application topically in the morning, at noon, and at bedtime. Apply to buttocks every shift   Boost VHC Liqd Take 240 mLs by mouth 3 (three) times daily.   cefadroxil 500 MG capsule Commonly known as: DURICEF Take 1 capsule (500 mg total) by mouth daily for 10 days. Start taking on: June 27, 2024   cyanocobalamin  1000 MCG tablet Commonly known as: VITAMIN B12 Take 1,000 mcg by mouth daily.   Incruse Ellipta 62.5 MCG/ACT Aepb Generic drug: umeclidinium bromide Inhale 1 puff into the lungs daily.   IRON PO Take 1 tablet by mouth daily.   metoprolol  tartrate 50 MG tablet Commonly known as: LOPRESSOR  Take 1 tablet (50 mg total) by mouth 2 (two) times daily. What changed:  medication strength how much to take how to take this when to take this additional instructions   multivitamin tablet Take 1 tablet by mouth daily.   nitroGLYCERIN  0.4 MG SL tablet Commonly known as: NITROSTAT  Place 0.4 mg under the tongue every 5 (five) minutes as needed for chest pain.   polyethylene glycol 17 g packet Commonly known as: MIRALAX  / GLYCOLAX  Take 17 g by mouth daily as needed for mild constipation.        Contact information for follow-up providers     [facility]. Schedule an appointment as soon as possible for a visit in 1 week(s).   Contact information: [address] [place] [address] [place]  [address] [phone]             Contact information for after-discharge care     Destination     [name] .   Service: Skilled Nursing Contact information: [address] [place]  [phone]                     Discharge Instructions     Diet - low sodium heart healthy   Complete by: As directed     Increase activity slowly   Complete by: As directed        Discharge Exam: Filed Weights   06/24/24 0448 06/25/24 0613 06/26/24 0230  Weight: 83 kg 82.3 kg 82.6 kg    General: Pt is alert, awake, not in acute distress, hard of hearing, sitting up in chair, watching news Cardiovascular: Irregular, rate controlled, nl S1-S2, no murmurs appreciated.   No LE edema.   Respiratory: Normal respiratory rate and rhythm.  CTAB without rales or wheezes. Abdominal: Abdomen soft and non-tender.  No distension or HSM.   Neuro/Psych: Strength symmetric in upper and lower extremities.  Judgment and insight appear impairred but at baseline.   Condition at discharge: fair  The results of significant diagnostics from this hospitalization (including imaging, microbiology, ancillary and laboratory) are listed below for reference.   Imaging  Studies: CT ABDOMEN PELVIS W CONTRAST Result Date: 06/20/2024 EXAM: CT ABDOMEN AND PELVIS WITH CONTRAST 06/20/2024 01:15:20 AM TECHNIQUE: CT of the abdomen and pelvis was performed with the administration of 75 mL iohexol  (OMNIPAQUE ) 350 MG/ML injection. Multiplanar reformatted images are provided for review. Automated exposure control, iterative reconstruction, and/or weight-based adjustment of the mA/kV was utilized to reduce the radiation dose to as low as reasonably achievable. COMPARISON: 09:263 CLINICAL HISTORY: Abdominal pain. Table formatting from the original note was not included. 75cc [id number]. Patient brought in by [facility] from [facility] complaining of right side abdominal pain. Patient stated he has nausea/vomiting since 2100. Patient is AMS at baseline. Patient is compliant with medications. FINDINGS: LOWER CHEST: There is minimal patchy atelectasis/airspace disease in the lung bases. LIVER: There is some relative hyperdensity surrounding the gallbladder fossa which may be related to focal fatty sparing. There is mild diffuse fatty infiltration of the  liver. GALLBLADDER AND BILE DUCTS: Small gallstones are present. No biliary ductal dilatation. SPLEEN: No acute abnormality. PANCREAS: No acute abnormality. ADRENAL GLANDS: No acute abnormality. KIDNEYS, URETERS AND BLADDER: There are numerous bilateral renal cysts. The right kidney is well aligned. Largest cyst on the right measures 10.8 cm. Larger cyst in the left measures 7.7 cm. There is an 8 mm calculus in the right kidney. There is moderate dilatation of the right ureter, but no hydronephrosis. No obstructing calculus identified. Left ureter is nondilated. The bladder is distended. There is trabeculated bladder wall. Per consensus, no follow-up is needed for simple Bosniak type 1 and 2 renal cysts, unless the patient has a malignancy history or risk factors. GI AND BOWEL: Stomach demonstrates no acute abnormality. There is sigmoid colon diverticulosis without evidence for acute diverticulitis. The appendix appears normal. There is no bowel obstruction. PERITONEUM AND RETROPERITONEUM: No ascites. No free air. VASCULATURE: Aorta is normal in caliber. There are atherosclerotic calcifications of the aorta. LYMPH NODES: No lymphadenopathy. REPRODUCTIVE ORGANS: There are calcifications in a mildly enlarged prostate gland. BONES AND SOFT TISSUES: Right hip arthroplasty is present. Degenerative changes affecting left hip and spine. No focal soft tissue abnormality. LIMITATIONS/ARTIFACTS: Examination is limited secondary to artifact from patient's arm positioning. IMPRESSION: 1. Moderate dilatation of the right ureter without hydronephrosis and no obstructing calculus identified; 8 mm nonobstructing right renal calculus. 2. Multiple bilateral renal cysts, largest measuring 10.8 cm on the right and 7.7 cm on the left. No follow-up imaging recommended. 3. Cholelithiasis. 4. No acute findings.Hepatic steatosis. 5. Sigmoid colon diverticulosis without evidence of acute diverticulitis. 6. Minimal bibasilar  atelectasis/airspace disease. Electronically signed by: Greig Pique MD 06/20/2024 01:34 AM EDT RP Workstation: [id number]   DG Chest Portable 1 View Result Date: 06/20/2024 EXAM: 1 VIEW(S) XRAY OF THE CHEST 06/20/2024 12:25:00 AM COMPARISON: Portable chest 04/04/2024. CLINICAL HISTORY: AMS. Encounter for Altered Mental status, change in breathing. FINDINGS: LINES, TUBES AND DEVICES: Stable left chest dual lead pacing system and wire insertions. Multiple overlying monitor wires. LUNGS AND PLEURA: Low inspiration. Mild chronic interstitial changes of the lungs. No visible focal pneumonia. No pulmonary edema. No pleural effusion. No pneumothorax. HEART AND MEDIASTINUM: Mild cardiomegaly. No vascular congestion is seen. Stable mediastinum with aortic calcification and mild tortuosity. BONES AND SOFT TISSUES: Osteopenia. Chronic rotator cuff arthropathy. Degenerative changes of the thoracic spine. IMPRESSION: 1. No acute cardiopulmonary findings. Low inspiration on exam. 2. Mild cardiomegaly without vascular congestion. Electronically signed by: Francis Quam MD 06/20/2024 12:32 AM EDT RP Workstation: [id number]    Microbiology: Results for orders  placed or performed during the hospital encounter of 06/19/24  Resp panel by RT-PCR (RSV, Flu A&B, Covid) Anterior Nasal Swab     Status: None   Collection Time: 06/20/24 12:03 AM   Specimen: Anterior Nasal Swab  Result Value Ref Range Status   SARS Coronavirus 2 by RT PCR NEGATIVE NEGATIVE Final   Influenza A by PCR NEGATIVE NEGATIVE Final   Influenza B by PCR NEGATIVE NEGATIVE Final    Comment: (NOTE) The Xpert Xpress SARS-CoV-2/FLU/RSV plus assay is intended as an aid in the diagnosis of influenza from Nasopharyngeal swab specimens and should not be used as a sole basis for treatment. Nasal washings and aspirates are unacceptable for Xpert Xpress SARS-CoV-2/FLU/RSV testing.  Fact Sheet for Patients: bloggercourse.com  Fact  Sheet for Healthcare Providers: seriousbroker.it  This test is not yet approved or cleared by the [place]  FDA and has been authorized for detection and/or diagnosis of SARS-CoV-2 by FDA under an Emergency Use Authorization (EUA). This EUA will remain in effect (meaning this test can be used) for the duration of the COVID-19 declaration under Section 564(b)(1) of the Act, 21 U.S.C. section 360bbb-3(b)(1), unless the authorization is terminated or revoked.     Resp Syncytial Virus by PCR NEGATIVE NEGATIVE Final    Comment: (NOTE) Fact Sheet for Patients: bloggercourse.com  Fact Sheet for Healthcare Providers: seriousbroker.it  This test is not yet approved or cleared by the [place]  FDA and has been authorized for detection and/or diagnosis of SARS-CoV-2 by FDA under an Emergency Use Authorization (EUA). This EUA will remain in effect (meaning this test can be used) for the duration of the COVID-19 declaration under Section 564(b)(1) of the Act, 21 U.S.C. section 360bbb-3(b)(1), unless the authorization is terminated or revoked.  Performed at [facility], [address] [place], [place] [address]   Urine Culture     Status: Abnormal   Collection Time: 06/20/24  3:05 AM   Specimen: Urine, Clean Catch  Result Value Ref Range Status   Specimen Description URINE, CLEAN CATCH  Final   Special Requests   Final    NONE Performed at [facility], [address], [place], [place] [address]    Culture >=100,000 COLONIES/mL ESCHERICHIA COLI (A)  Final   Report Status 06/23/2024 FINAL  Final   Organism ID, Bacteria ESCHERICHIA COLI (A)  Final      Susceptibility   Escherichia coli - MIC*    AMPICILLIN <=2 SENSITIVE Sensitive     CEFAZOLIN  (URINE) Value in next row Sensitive      2 SENSITIVEThis is a modified FDA-approved test that has been validated and its performance characteristics  determined by the reporting laboratory.  This laboratory is certified under the Clinical Laboratory Improvement Amendments CLIA as qualified to perform high complexity clinical laboratory testing.    CEFEPIME Value in next row Sensitive      2 SENSITIVEThis is a modified FDA-approved test that has been validated and its performance characteristics determined by the reporting laboratory.  This laboratory is certified under the Clinical Laboratory Improvement Amendments CLIA as qualified to perform high complexity clinical laboratory testing.    ERTAPENEM Value in next row Sensitive      2 SENSITIVEThis is a modified FDA-approved test that has been validated and its performance characteristics determined by the reporting laboratory.  This laboratory is certified under the Clinical Laboratory Improvement Amendments CLIA as qualified to perform high complexity clinical laboratory testing.    CEFTRIAXONE  Value in  next row Sensitive      2 SENSITIVEThis is a modified FDA-approved test that has been validated and its performance characteristics determined by the reporting laboratory.  This laboratory is certified under the Clinical Laboratory Improvement Amendments CLIA as qualified to perform high complexity clinical laboratory testing.    CIPROFLOXACIN  Value in next row Sensitive      2 SENSITIVEThis is a modified FDA-approved test that has been validated and its performance characteristics determined by the reporting laboratory.  This laboratory is certified under the Clinical Laboratory Improvement Amendments CLIA as qualified to perform high complexity clinical laboratory testing.    GENTAMICIN  Value in next row Sensitive      2 SENSITIVEThis is a modified FDA-approved test that has been validated and its performance characteristics determined by the reporting laboratory.  This laboratory is certified under the Clinical Laboratory Improvement Amendments CLIA as qualified to perform high complexity clinical  laboratory testing.    NITROFURANTOIN Value in next row Sensitive      2 SENSITIVEThis is a modified FDA-approved test that has been validated and its performance characteristics determined by the reporting laboratory.  This laboratory is certified under the Clinical Laboratory Improvement Amendments CLIA as qualified to perform high complexity clinical laboratory testing.    TRIMETH/SULFA Value in next row Sensitive      2 SENSITIVEThis is a modified FDA-approved test that has been validated and its performance characteristics determined by the reporting laboratory.  This laboratory is certified under the Clinical Laboratory Improvement Amendments CLIA as qualified to perform high complexity clinical laboratory testing.    AMPICILLIN/SULBACTAM Value in next row Sensitive      2 SENSITIVEThis is a modified FDA-approved test that has been validated and its performance characteristics determined by the reporting laboratory.  This laboratory is certified under the Clinical Laboratory Improvement Amendments CLIA as qualified to perform high complexity clinical laboratory testing.    PIP/TAZO Value in next row Sensitive      <=4 SENSITIVEThis is a modified FDA-approved test that has been validated and its performance characteristics determined by the reporting laboratory.  This laboratory is certified under the Clinical Laboratory Improvement Amendments CLIA as qualified to perform high complexity clinical laboratory testing.    MEROPENEM Value in next row Sensitive      <=4 SENSITIVEThis is a modified FDA-approved test that has been validated and its performance characteristics determined by the reporting laboratory.  This laboratory is certified under the Clinical Laboratory Improvement Amendments CLIA as qualified to perform high complexity clinical laboratory testing.    * >=100,000 COLONIES/mL ESCHERICHIA COLI  Blood culture (routine x 2)     Status: Abnormal   Collection Time: 06/20/24  3:44 AM    Specimen: BLOOD LEFT ARM  Result Value Ref Range Status   Specimen Description BLOOD LEFT ARM  Final   Special Requests   Final    BOTTLES DRAWN AEROBIC AND ANAEROBIC Blood Culture results may not be optimal due to an inadequate volume of blood received in culture bottles   Culture  Setup Time   Final    GRAM POSITIVE COCCI AEROBIC BOTTLE ONLY CRITICAL RESULT CALLED TO, READ BACK BY AND VERIFIED WITH: PHARMD M.LAMB AT 1000 ON 06/21/2024 BY T.SAAD.    Culture (A)  Final    STAPHYLOCOCCUS AURICULARIS THE SIGNIFICANCE OF ISOLATING THIS ORGANISM FROM A SINGLE SET OF BLOOD CULTURES WHEN MULTIPLE SETS ARE DRAWN IS UNCERTAIN. PLEASE NOTIFY THE MICROBIOLOGY DEPARTMENT WITHIN ONE WEEK IF SPECIATION AND SENSITIVITIES ARE REQUIRED.  Performed at [facility], [address] [place], [place] [address]    Report Status 06/23/2024 FINAL  Final  Blood Culture ID Panel (Reflexed)     Status: Abnormal   Collection Time: 06/20/24  3:44 AM  Result Value Ref Range Status   Enterococcus faecalis NOT DETECTED NOT DETECTED Final   Enterococcus Faecium NOT DETECTED NOT DETECTED Final   Listeria monocytogenes NOT DETECTED NOT DETECTED Final   Staphylococcus species DETECTED (A) NOT DETECTED Final    Comment: CRITICAL RESULT CALLED TO, READ BACK BY AND VERIFIED WITH: PHARMD M.LAMB AT 1000 ON 06/21/2024 BY T.SAAD.    Staphylococcus aureus (BCID) NOT DETECTED NOT DETECTED Final   Staphylococcus epidermidis NOT DETECTED NOT DETECTED Final   Staphylococcus lugdunensis NOT DETECTED NOT DETECTED Final   Streptococcus species NOT DETECTED NOT DETECTED Final   Streptococcus agalactiae NOT DETECTED NOT DETECTED Final   Streptococcus pneumoniae NOT DETECTED NOT DETECTED Final   Streptococcus pyogenes NOT DETECTED NOT DETECTED Final   A.calcoaceticus-baumannii NOT DETECTED NOT DETECTED Final   Bacteroides fragilis NOT DETECTED NOT DETECTED Final   Enterobacterales NOT DETECTED NOT DETECTED Final   Enterobacter  cloacae complex NOT DETECTED NOT DETECTED Final   Escherichia coli NOT DETECTED NOT DETECTED Final   Klebsiella aerogenes NOT DETECTED NOT DETECTED Final   Klebsiella oxytoca NOT DETECTED NOT DETECTED Final   Klebsiella pneumoniae NOT DETECTED NOT DETECTED Final   Proteus species NOT DETECTED NOT DETECTED Final   Salmonella species NOT DETECTED NOT DETECTED Final   Serratia marcescens NOT DETECTED NOT DETECTED Final   Haemophilus influenzae NOT DETECTED NOT DETECTED Final   Neisseria meningitidis NOT DETECTED NOT DETECTED Final   Pseudomonas aeruginosa NOT DETECTED NOT DETECTED Final   Stenotrophomonas maltophilia NOT DETECTED NOT DETECTED Final   Candida albicans NOT DETECTED NOT DETECTED Final   Candida auris NOT DETECTED NOT DETECTED Final   Candida glabrata NOT DETECTED NOT DETECTED Final   Candida krusei NOT DETECTED NOT DETECTED Final   Candida parapsilosis NOT DETECTED NOT DETECTED Final   Candida tropicalis NOT DETECTED NOT DETECTED Final   Cryptococcus neoformans/gattii NOT DETECTED NOT DETECTED Final    Comment: Performed at [facility], [address] [place], [place] [address]  Blood culture (routine x 2)     Status: None   Collection Time: 06/20/24  4:57 AM   Specimen: BLOOD RIGHT ARM  Result Value Ref Range Status   Specimen Description BLOOD RIGHT ARM  Final   Special Requests   Final    BOTTLES DRAWN AEROBIC AND ANAEROBIC Blood Culture adequate volume   Culture   Final    NO GROWTH 5 DAYS Performed at [facility], [address], [place], [place] [address]    Report Status 06/25/2024 FINAL  Final    Labs: CBC: Recent Labs  Lab 06/20/24 0005 06/21/24 0257 06/22/24 0352 06/24/24 1010  WBC 11.3* 14.2* 10.8* 8.2  NEUTROABS 8.8*  --   --   --   HGB 14.8 13.1 11.7* 12.0*  HCT 44.7 40.0 35.8* 37.4*  MCV 103.5* 103.6* 104.4* 105.1*  PLT 144* 122* PLATELET CLUMPS NOTED ON SMEAR, UNABLE TO ESTIMATE 133*   Basic Metabolic Panel: Recent Labs  Lab  06/20/24 0005 06/21/24 0257 06/22/24 0352 06/23/24 0728 06/24/24 1010  NA 137 139 140 141 140  K 3.8 4.3 3.6 3.3* 4.0  CL 102 102 103 104 108  CO2 23 24 26 23  20*  GLUCOSE 152* 138* 97  109* 110*  BUN 26* 26* 24* 22 19  CREATININE 1.79* 1.77* 1.59* 1.50* 1.43*  CALCIUM  9.1 8.4* 8.1* 8.3* 8.0*  MG  --  1.7  --   --   --   PHOS  --  4.3  --   --   --    Liver Function Tests: Recent Labs  Lab 06/20/24 0005 06/21/24 0257 06/22/24 0352 06/23/24 0728 06/24/24 1010  AST 103* 270* 106* 64* 33  ALT 58* 272* 156* 115* 74*  ALKPHOS 90 178* 143* 164* 159*  BILITOT 2.7* 3.1* 1.8* 2.1* 1.2  PROT 7.1 5.6* 4.7* 5.5* 5.2*  ALBUMIN  3.5 2.5* 2.1* 2.4* 2.2*   CBG: No results for input(s): GLUCAP in the last 168 hours.  Discharge time spent: approximately 45 minutes spent on discharge counseling, evaluation of patient on day of discharge, and coordination of discharge planning with nursing, social work, pharmacy and case management  Signed: Lonni SHAUNNA Dalton, MD [facility] 06/26/2024

## 2024-06-26 NOTE — Care Plan (Addendum)
 Report given to Ilene, RN @ [facility]. Patient will be DC with foley per MD

## 2024-06-26 NOTE — Plan of Care (Signed)
  Problem: Nutrition: Goal: Adequate nutrition will be maintained Outcome: Progressing   Problem: Elimination: Goal: Will not experience complications related to urinary retention Outcome: Progressing   Problem: Pain Managment: Goal: General experience of comfort will improve and/or be controlled Outcome: Progressing   Problem: Safety: Goal: Ability to remain free from injury will improve Outcome: Progressing

## 2024-06-27 ENCOUNTER — Encounter

## 2024-06-29 DIAGNOSIS — R54 Age-related physical debility: Secondary | ICD-10-CM | POA: Diagnosis not present

## 2024-06-29 DIAGNOSIS — N1832 Chronic kidney disease, stage 3b: Secondary | ICD-10-CM | POA: Diagnosis not present

## 2024-06-29 DIAGNOSIS — R260 Ataxic gait: Secondary | ICD-10-CM | POA: Diagnosis not present

## 2024-06-29 DIAGNOSIS — I480 Paroxysmal atrial fibrillation: Secondary | ICD-10-CM | POA: Diagnosis not present

## 2024-07-02 ENCOUNTER — Telehealth: Payer: Self-pay

## 2024-07-02 NOTE — Telephone Encounter (Signed)
 [facility]OTT PPM ALERT: patient is dependent DEVICE AT [facility] on 07/02/24.  Also poor V rate control with recent AF (OAC contraindicated) - recent hospitalization for UTI/SEPSIS  Generator change out discussion held with Dr. Inocencio at June 2025 OV.   Forwarding to Maeola Domino - RN and April Garrison - procedure scheduler to set up.  NOTE: patient recently hospitalized for UTI and Sepsis.  He is in an nursing facility currently according to hospital discharge.   May need to hold up due to acute illness/sepsis:  will defer to [facility] and Dr. Inocencio for direction.

## 2024-07-03 DIAGNOSIS — Z79899 Other long term (current) drug therapy: Secondary | ICD-10-CM | POA: Diagnosis not present

## 2024-07-05 ENCOUNTER — Encounter

## 2024-07-05 DIAGNOSIS — R3914 Feeling of incomplete bladder emptying: Secondary | ICD-10-CM | POA: Diagnosis not present

## 2024-07-05 DIAGNOSIS — N401 Enlarged prostate with lower urinary tract symptoms: Secondary | ICD-10-CM | POA: Diagnosis not present

## 2024-07-05 DIAGNOSIS — N200 Calculus of kidney: Secondary | ICD-10-CM | POA: Diagnosis not present

## 2024-07-09 ENCOUNTER — Encounter

## 2024-07-09 ENCOUNTER — Ambulatory Visit: Attending: Cardiology

## 2024-07-09 DIAGNOSIS — I495 Sick sinus syndrome: Secondary | ICD-10-CM | POA: Diagnosis not present

## 2024-07-09 LAB — CUP PACEART REMOTE DEVICE CHECK
Battery Remaining Longevity: 0 mo
Battery Voltage: 2.59 V
Brady Statistic AP VP Percent: 86 %
Brady Statistic AP VS Percent: 1 %
Brady Statistic AS VP Percent: 11 %
Brady Statistic AS VS Percent: 1 %
Brady Statistic RA Percent Paced: 37 %
Brady Statistic RV Percent Paced: 50 %
Date Time Interrogation Session: 20251117020021
Implantable Lead Connection Status: 753985
Implantable Lead Connection Status: 753985
Implantable Lead Implant Date: 20170322
Implantable Lead Implant Date: 20170322
Implantable Lead Location: 753859
Implantable Lead Location: 753860
Implantable Pulse Generator Implant Date: 20170322
Lead Channel Impedance Value: 390 Ohm
Lead Channel Impedance Value: 430 Ohm
Lead Channel Pacing Threshold Amplitude: 0.75 V
Lead Channel Pacing Threshold Amplitude: 1 V
Lead Channel Pacing Threshold Pulse Width: 0.5 ms
Lead Channel Pacing Threshold Pulse Width: 0.5 ms
Lead Channel Sensing Intrinsic Amplitude: 1.7 mV
Lead Channel Sensing Intrinsic Amplitude: 4.1 mV
Lead Channel Setting Pacing Amplitude: 2 V
Lead Channel Setting Pacing Amplitude: 2.5 V
Lead Channel Setting Pacing Pulse Width: 0.5 ms
Lead Channel Setting Sensing Sensitivity: 2 mV
Pulse Gen Model: 2272
Pulse Gen Serial Number: 3134370

## 2024-07-10 ENCOUNTER — Ambulatory Visit: Payer: Self-pay | Admitting: Cardiology

## 2024-07-11 ENCOUNTER — Telehealth: Payer: Self-pay | Admitting: Cardiology

## 2024-07-11 NOTE — Progress Notes (Signed)
 Remote PPM Transmission

## 2024-07-11 NOTE — Telephone Encounter (Signed)
 Pt daughter asked that someone call her back as well to discuss

## 2024-07-11 NOTE — Telephone Encounter (Signed)
 Spoke to patients daughter to advise someone will call to discuss a date for gen change. She wanted to know maybe when advised her I was unsure that I would send a message to sherri to advise further on that. Also, request a December gen change date. Was appreciative of call back.

## 2024-07-11 NOTE — Telephone Encounter (Signed)
 Caller Rosy) is following-up on if patient is to have his battery replaced.

## 2024-07-11 NOTE — Telephone Encounter (Signed)
 Dr. Inocencio please disregard previous message. I just spoke with facility Debbrah Simpers) who reports pt is back to baseline and doing well.  There is no concern for decline/hospice at this time.   Will be calling them to arrange battery change.

## 2024-07-13 NOTE — Telephone Encounter (Signed)
 Left detailed message informing dtr that I would reach out as soon as I could, but not sure if can get it scheduled for this year. Aware I will look into option and call them, and that it may be after the holiday before calling back.

## 2024-07-17 ENCOUNTER — Encounter: Payer: Self-pay | Admitting: *Deleted

## 2024-07-17 NOTE — Telephone Encounter (Addendum)
 Spoke to dtr-in-law and facility.  [name], RN at [facility] -- advised of procedure date 1/8 and instructions will be sent via mychart.  Will email instructions to facility.  Facility will draw BMET & CBC the week before and fax results to our office. Aware schedulers will call  to arrange post procedure follow up  Left detailed message for dtr-in-law of procedure date and instructions being sent via mychart.  (RN states pt is not taking Lasix .  Removed from his [facility])  RN reports pt hypotensive since hospital increase pt's Metoprolol  to 50 mg BID.  Says BP are averaging 90s/50-60s, HRs avg 80s. Forwarding to MD to see if ok to decrease back to 25 mg BID

## 2024-07-24 DIAGNOSIS — R3914 Feeling of incomplete bladder emptying: Secondary | ICD-10-CM | POA: Diagnosis not present

## 2024-07-24 DIAGNOSIS — N401 Enlarged prostate with lower urinary tract symptoms: Secondary | ICD-10-CM | POA: Diagnosis not present

## 2024-07-26 DIAGNOSIS — H353121 Nonexudative age-related macular degeneration, left eye, early dry stage: Secondary | ICD-10-CM | POA: Diagnosis not present

## 2024-07-26 DIAGNOSIS — H25012 Cortical age-related cataract, left eye: Secondary | ICD-10-CM | POA: Diagnosis not present

## 2024-07-26 DIAGNOSIS — H353114 Nonexudative age-related macular degeneration, right eye, advanced atrophic with subfoveal involvement: Secondary | ICD-10-CM | POA: Diagnosis not present

## 2024-07-26 DIAGNOSIS — H2512 Age-related nuclear cataract, left eye: Secondary | ICD-10-CM | POA: Diagnosis not present

## 2024-07-30 ENCOUNTER — Telehealth: Payer: Self-pay

## 2024-07-30 NOTE — Telephone Encounter (Signed)
-----   Message from Nurse Sherri P sent at 07/23/2024  9:35 AM EST ----- Regarding: 08/30/2024  PPM gen change Precert:  MD: Camnitz Type of implant: PPM gen change Device manufacturer: Abbott Diagnosis: bradycardia/ERI CPT code: PPM change out, dual - 66771 C-code(s), including quantity (if indicated): 1785 Procedure scheduled (date/time): 08/30/24  3:30pm  Procedure:  Scrub given? No  Medication instructions: routine Message sent to CVRR? [name] Added to calendar? Yes Orders entered? Yes Letter complete? Yes Scheduled with cath lab? Yes Labs ordered (CBC, BMET, PT/INR if on warfarin)? Facility ([facility]) will draw week before procedure and fax to us  Dye allergy? No Pre-meds ordered and instructions given? [name] Letter method: MyChart & emailed to facility Special instructions:  H&P: 01/30/24  Follow-up:  Cassie/Angel, please schedule Routine.

## 2024-07-31 NOTE — Telephone Encounter (Signed)
 Followed up with Ilene at [facility]. She reports pt has stabled and doing better so no need to address any medication change at this time. They will call back if need to re discuss.  She appreciates the follow up

## 2024-08-01 DIAGNOSIS — I739 Peripheral vascular disease, unspecified: Secondary | ICD-10-CM | POA: Diagnosis not present

## 2024-08-01 DIAGNOSIS — L603 Nail dystrophy: Secondary | ICD-10-CM | POA: Diagnosis not present

## 2024-08-01 DIAGNOSIS — I890 Lymphedema, not elsewhere classified: Secondary | ICD-10-CM | POA: Diagnosis not present

## 2024-08-01 DIAGNOSIS — L84 Corns and callosities: Secondary | ICD-10-CM | POA: Diagnosis not present

## 2024-08-01 DIAGNOSIS — L602 Onychogryphosis: Secondary | ICD-10-CM | POA: Diagnosis not present

## 2024-08-06 ENCOUNTER — Encounter

## 2024-08-08 ENCOUNTER — Encounter (HOSPITAL_COMMUNITY): Payer: Self-pay

## 2024-08-08 ENCOUNTER — Telehealth (HOSPITAL_COMMUNITY): Payer: Self-pay

## 2024-08-08 NOTE — Telephone Encounter (Signed)
 Spoke with patient's son, Justin Baker to complete pre-procedure call. Patient is a resident of [facility].   Health status review:  Any new medical conditions, recent signs of acute illness or been started on antibiotics? No. Patient had hospitalization for UTI and sepsis 10/28-11/4. His son denies any signs of acute illness since discharge.  Any new medications started since pre-op visit? No  Follow all medication instructions prior to procedure or the procedure may be rescheduled:    On the morning of your procedure, take only your morning medications with small sips of water.  Nothing to eat or drink after midnight prior to your procedure. The night before your procedure and the morning of your procedure, wash thoroughly with the CHG surgical soap from the neck down, paying special attention to the area where your procedure will be performed.  Pre-procedure testing scheduled: lab work by week of December 29.  Confirmed patient is scheduled for  PPM generator change on Thursday, January 8 with Dr. Inocencio. Instructed patient to arrive at the Main Entrance A at [facility]: [address] [place], [place] [address] and check in at Admitting at 1:30 PM.  Plan to go home the same day, you will only stay overnight if medically necessary. You MUST have a responsible adult to drive you home and MUST be with you the first 24 hours after you arrive home or your procedure could be cancelled.  Informed a nurse may call a day before the procedure to confirm arrival time and ensure instructions are followed.  Patient's son verbalized understanding to information provided and is agreeable to proceed with procedure.   Advised to contact RN Navigator at [phone], to inform of any new medications started after call or concerns prior to procedure.

## 2024-08-08 NOTE — Telephone Encounter (Signed)
 Attempted to reach patient to discuss upcoming procedure, no answer. Left VM for patient to return call.

## 2024-08-09 ENCOUNTER — Encounter

## 2024-08-09 ENCOUNTER — Ambulatory Visit

## 2024-08-14 ENCOUNTER — Encounter: Payer: Self-pay | Admitting: Pharmacist

## 2024-08-14 NOTE — Progress Notes (Signed)
 Pharmacy Quality Measure Review  This patient is appearing on a report for being at risk of failing the adherence measure for cholesterol (statin) medications this calendar year.   Medication: atorvastatin   Last fill date: 06/01/2024 for 30 day supply  Per patient med list - atorvastatin  is currently paused since 06/26/2024.  He has had multiple hospitalizations in 2025. Scheduled in January 2026 for generator change out Patient resides in SNF.   No additional action needed at this time since atorvastatin  is on hold per cardiology.  Madelin Ray, PharmD Clinical Pharmacist [facility]  Population Health [phone]

## 2024-08-15 ENCOUNTER — Inpatient Hospital Stay (HOSPITAL_COMMUNITY)
Admission: EM | Admit: 2024-08-15 | Discharge: 2024-08-21 | DRG: 193 | Disposition: A | Discharge status: Skilled Nursing Facility | Source: Skilled Nursing Facility | Attending: Internal Medicine | Admitting: Internal Medicine

## 2024-08-15 ENCOUNTER — Other Ambulatory Visit: Payer: Self-pay

## 2024-08-15 ENCOUNTER — Emergency Department (HOSPITAL_COMMUNITY)

## 2024-08-15 DIAGNOSIS — J440 Chronic obstructive pulmonary disease with acute lower respiratory infection: Secondary | ICD-10-CM | POA: Diagnosis present

## 2024-08-15 DIAGNOSIS — G9341 Metabolic encephalopathy: Secondary | ICD-10-CM | POA: Diagnosis present

## 2024-08-15 DIAGNOSIS — J189 Pneumonia, unspecified organism: Principal | ICD-10-CM | POA: Diagnosis present

## 2024-08-15 DIAGNOSIS — E860 Dehydration: Secondary | ICD-10-CM | POA: Diagnosis present

## 2024-08-15 DIAGNOSIS — Z8709 Personal history of other diseases of the respiratory system: Secondary | ICD-10-CM

## 2024-08-15 DIAGNOSIS — N390 Urinary tract infection, site not specified: Secondary | ICD-10-CM | POA: Diagnosis present

## 2024-08-15 DIAGNOSIS — R531 Weakness: Secondary | ICD-10-CM | POA: Diagnosis not present

## 2024-08-15 DIAGNOSIS — Z8673 Personal history of transient ischemic attack (TIA), and cerebral infarction without residual deficits: Secondary | ICD-10-CM

## 2024-08-15 DIAGNOSIS — Z87440 Personal history of urinary (tract) infections: Secondary | ICD-10-CM

## 2024-08-15 DIAGNOSIS — Z87891 Personal history of nicotine dependence: Secondary | ICD-10-CM

## 2024-08-15 DIAGNOSIS — I495 Sick sinus syndrome: Secondary | ICD-10-CM | POA: Diagnosis present

## 2024-08-15 DIAGNOSIS — I480 Paroxysmal atrial fibrillation: Secondary | ICD-10-CM | POA: Diagnosis present

## 2024-08-15 DIAGNOSIS — H9190 Unspecified hearing loss, unspecified ear: Secondary | ICD-10-CM | POA: Diagnosis present

## 2024-08-15 DIAGNOSIS — Z96641 Presence of right artificial hip joint: Secondary | ICD-10-CM | POA: Diagnosis present

## 2024-08-15 DIAGNOSIS — Z8616 Personal history of COVID-19: Secondary | ICD-10-CM

## 2024-08-15 DIAGNOSIS — D696 Thrombocytopenia, unspecified: Secondary | ICD-10-CM | POA: Diagnosis present

## 2024-08-15 DIAGNOSIS — I130 Hypertensive heart and chronic kidney disease with heart failure and stage 1 through stage 4 chronic kidney disease, or unspecified chronic kidney disease: Secondary | ICD-10-CM | POA: Diagnosis present

## 2024-08-15 DIAGNOSIS — I4891 Unspecified atrial fibrillation: Secondary | ICD-10-CM | POA: Diagnosis present

## 2024-08-15 DIAGNOSIS — R296 Repeated falls: Secondary | ICD-10-CM | POA: Diagnosis present

## 2024-08-15 DIAGNOSIS — Z831 Family history of other infectious and parasitic diseases: Secondary | ICD-10-CM

## 2024-08-15 DIAGNOSIS — Z950 Presence of cardiac pacemaker: Secondary | ICD-10-CM

## 2024-08-15 DIAGNOSIS — G9340 Encephalopathy, unspecified: Secondary | ICD-10-CM | POA: Diagnosis present

## 2024-08-15 DIAGNOSIS — Z1152 Encounter for screening for COVID-19: Secondary | ICD-10-CM

## 2024-08-15 DIAGNOSIS — Z66 Do not resuscitate: Secondary | ICD-10-CM | POA: Diagnosis present

## 2024-08-15 DIAGNOSIS — Z79899 Other long term (current) drug therapy: Secondary | ICD-10-CM

## 2024-08-15 DIAGNOSIS — I5032 Chronic diastolic (congestive) heart failure: Secondary | ICD-10-CM | POA: Diagnosis present

## 2024-08-15 DIAGNOSIS — N1832 Chronic kidney disease, stage 3b: Secondary | ICD-10-CM | POA: Diagnosis present

## 2024-08-15 LAB — URINALYSIS, W/ REFLEX TO CULTURE (INFECTION SUSPECTED)
Bilirubin Urine: NEGATIVE
Glucose, UA: NEGATIVE mg/dL
Hgb urine dipstick: NEGATIVE
Ketones, ur: NEGATIVE mg/dL
Nitrite: NEGATIVE
Protein, ur: 30 mg/dL — AB
Specific Gravity, Urine: 1.011 (ref 1.005–1.030)
WBC, UA: >50 WBC/hpf (ref 0–5)
pH: 6 (ref 5.0–8.0)

## 2024-08-15 LAB — COMPREHENSIVE METABOLIC PANEL WITH GFR
ALT: 14 U/L (ref 0–44)
AST: 28 U/L (ref 15–41)
Albumin: 3.4 g/dL — ABNORMAL LOW (ref 3.5–5.0)
Alkaline Phosphatase: 75 U/L (ref 38–126)
Anion gap: 9 (ref 5–15)
BUN: 30 mg/dL — ABNORMAL HIGH (ref 8–23)
CO2: 22 mmol/L (ref 22–32)
Calcium: 8.5 mg/dL — ABNORMAL LOW (ref 8.9–10.3)
Chloride: 104 mmol/L (ref 98–111)
Creatinine, Ser: 1.64 mg/dL — ABNORMAL HIGH (ref 0.61–1.24)
GFR, Estimated: 37 mL/min — ABNORMAL LOW
Glucose, Bld: 120 mg/dL — ABNORMAL HIGH (ref 70–99)
Potassium: 4.2 mmol/L (ref 3.5–5.1)
Sodium: 136 mmol/L (ref 135–145)
Total Bilirubin: 0.5 mg/dL (ref 0.0–1.2)
Total Protein: 6.1 g/dL — ABNORMAL LOW (ref 6.5–8.1)

## 2024-08-15 LAB — I-STAT CHEM 8, ED
BUN: 30 mg/dL — ABNORMAL HIGH (ref 8–23)
Calcium, Ion: 1.13 mmol/L — ABNORMAL LOW (ref 1.15–1.40)
Chloride: 105 mmol/L (ref 98–111)
Creatinine, Ser: 1.7 mg/dL — ABNORMAL HIGH (ref 0.61–1.24)
Glucose, Bld: 120 mg/dL — ABNORMAL HIGH (ref 70–99)
HCT: 39 % (ref 39.0–52.0)
Hemoglobin: 13.3 g/dL (ref 13.0–17.0)
Potassium: 4.1 mmol/L (ref 3.5–5.1)
Sodium: 139 mmol/L (ref 135–145)
TCO2: 20 mmol/L — ABNORMAL LOW (ref 22–32)

## 2024-08-15 LAB — CBC
HCT: 38 % — ABNORMAL LOW (ref 39.0–52.0)
Hemoglobin: 12.6 g/dL — ABNORMAL LOW (ref 13.0–17.0)
MCH: 33.6 pg (ref 26.0–34.0)
MCHC: 33.2 g/dL (ref 30.0–36.0)
MCV: 101.3 fL — ABNORMAL HIGH (ref 80.0–100.0)
Platelets: 93 K/uL — ABNORMAL LOW (ref 150–400)
RBC: 3.75 MIL/uL — ABNORMAL LOW (ref 4.22–5.81)
RDW: 13.8 % (ref 11.5–15.5)
WBC: 7.8 K/uL (ref 4.0–10.5)
nRBC: 0.3 % — ABNORMAL HIGH (ref 0.0–0.2)

## 2024-08-15 LAB — PRO BRAIN NATRIURETIC PEPTIDE: Pro Brain Natriuretic Peptide: 7763 pg/mL — ABNORMAL HIGH

## 2024-08-15 LAB — I-STAT CG4 LACTIC ACID, ED: Lactic Acid, Venous: 0.9 mmol/L (ref 0.5–1.9)

## 2024-08-15 LAB — PROTIME-INR
INR: 1.1 (ref 0.8–1.2)
Prothrombin Time: 14.5 s (ref 11.4–15.2)

## 2024-08-15 LAB — CREATININE, SERUM
Creatinine, Ser: 1.51 mg/dL — ABNORMAL HIGH (ref 0.61–1.24)
GFR, Estimated: 41 mL/min — ABNORMAL LOW

## 2024-08-15 LAB — CBC WITH DIFFERENTIAL/PLATELET
Abs Immature Granulocytes: 0.03 K/uL (ref 0.00–0.07)
Basophils Absolute: 0 K/uL (ref 0.0–0.1)
Basophils Relative: 0 %
Eosinophils Absolute: 0 K/uL (ref 0.0–0.5)
Eosinophils Relative: 0 %
HCT: 40.1 % (ref 39.0–52.0)
Hemoglobin: 13.2 g/dL (ref 13.0–17.0)
Immature Granulocytes: 0 %
Lymphocytes Relative: 32 %
Lymphs Abs: 2.4 K/uL (ref 0.7–4.0)
MCH: 33.4 pg (ref 26.0–34.0)
MCHC: 32.9 g/dL (ref 30.0–36.0)
MCV: 101.5 fL — ABNORMAL HIGH (ref 80.0–100.0)
Monocytes Absolute: 0.5 K/uL (ref 0.1–1.0)
Monocytes Relative: 6 %
Neutro Abs: 4.6 K/uL (ref 1.7–7.7)
Neutrophils Relative %: 62 %
Platelets: 108 K/uL — ABNORMAL LOW (ref 150–400)
RBC: 3.95 MIL/uL — ABNORMAL LOW (ref 4.22–5.81)
RDW: 13.8 % (ref 11.5–15.5)
WBC: 7.6 K/uL (ref 4.0–10.5)
nRBC: 0 % (ref 0.0–0.2)

## 2024-08-15 LAB — RESP PANEL BY RT-PCR (RSV, FLU A&B, COVID)  RVPGX2
Influenza A by PCR: NEGATIVE
Influenza B by PCR: NEGATIVE
Resp Syncytial Virus by PCR: NEGATIVE
SARS Coronavirus 2 by RT PCR: NEGATIVE

## 2024-08-15 LAB — TROPONIN T, HIGH SENSITIVITY
Troponin T High Sensitivity: 46 ng/L — ABNORMAL HIGH (ref 0–19)
Troponin T High Sensitivity: 45 ng/L — ABNORMAL HIGH (ref 0–19)

## 2024-08-15 LAB — LIPASE, BLOOD: Lipase: 36 U/L (ref 11–51)

## 2024-08-15 MED ORDER — SODIUM CHLORIDE 0.9 % IV BOLUS
500.0000 mL | Freq: Once | INTRAVENOUS | Status: AC
  Administered 2024-08-15: 500 mL via INTRAVENOUS

## 2024-08-15 MED ORDER — SODIUM CHLORIDE 0.9 % IV SOLN
1.0000 g | INTRAVENOUS | Status: DC
  Administered 2024-08-15 – 2024-08-17 (×3): 1 g via INTRAVENOUS
  Filled 2024-08-15 (×3): qty 10

## 2024-08-15 MED ORDER — ONDANSETRON HCL 4 MG PO TABS: 4.0000 mg | ORAL_TABLET | Freq: Four times a day (QID) | ORAL | Status: DC | PRN

## 2024-08-15 MED ORDER — LACTATED RINGERS IV BOLUS (SEPSIS)
1000.0000 mL | Freq: Once | INTRAVENOUS | Status: AC
  Administered 2024-08-15: 1000 mL via INTRAVENOUS

## 2024-08-15 MED ORDER — METOPROLOL TARTRATE 25 MG PO TABS
50.0000 mg | ORAL_TABLET | Freq: Two times a day (BID) | ORAL | Status: DC
  Administered 2024-08-15 – 2024-08-16 (×3): 50 mg via ORAL
  Filled 2024-08-15 (×2): qty 2
  Filled 2024-08-15: qty 4

## 2024-08-15 MED ORDER — ENOXAPARIN SODIUM 30 MG/0.3ML IJ SOSY: 30.0000 mg | PREFILLED_SYRINGE | INTRAMUSCULAR | Status: DC

## 2024-08-15 MED ORDER — ONDANSETRON HCL 4 MG/2ML IJ SOLN
4.0000 mg | Freq: Four times a day (QID) | INTRAMUSCULAR | Status: DC | PRN
  Administered 2024-08-17: 4 mg via INTRAVENOUS
  Filled 2024-08-15: qty 2

## 2024-08-15 MED ORDER — PIPERACILLIN-TAZOBACTAM 3.375 G IVPB 30 MIN
3.3750 g | Freq: Once | INTRAVENOUS | Status: AC
  Administered 2024-08-15: 3.375 g via INTRAVENOUS
  Filled 2024-08-15: qty 50

## 2024-08-15 MED ORDER — SODIUM CHLORIDE 0.9 % IV SOLN: 2.0000 g | Freq: Once | INTRAVENOUS | Status: DC

## 2024-08-15 NOTE — H&P (Signed)
 " History and Physical    Patient: Justin Baker FMW:[id number] DOB: 05-Apr-1922 DOA: 08/15/2024 DOS: the patient was seen and examined on 08/15/2024 PCP: Almarie Waddell NOVAK, NP  Patient coming from: SNF  Chief Complaint:  Chief Complaint  Patient presents with   Shortness of Breath   Weakness   HPI: Justin Baker is a 89 y.o. male with medical history significant of atrial fibrillation, COPD, history of stroke, prior history of subdural hematoma, who was brought in from [place] when family went to check on him was worried about patient having sepsis.  He has had recurrent UTIs.  Apparently has been weak with generalized weakness and decreased oral intake for at least 2 days.  This has happened before with patient has had UTI.  No fever or chills that the family noted.  Patient then was brought to the ER for evaluation.  In the ER he was found to be tachycardic and hypotensive.  Urinalysis is suggestive of possible UTI.  Also AKI and thrombocytopenia.  At this point patient is being admitted for evaluation of possible UTI.  He is not septic.  Patient however has received some Rocephin  IM at the facility a few days ago.  Review of Systems: As mentioned in the history of present illness. All other systems reviewed and are negative. Past Medical History:  Diagnosis Date   ARF (acute renal failure) 05/16/2012   Atrial fibrillation (HCC)    Back pain    COPD (chronic obstructive pulmonary disease) (HCC)    self diagnosed   COVID 01/21/2021   Osteoarthritis    s/p right total hip   Pain in the chest    Shoulder pain, bilateral    Stroke (HCC)    Subdural hematoma (HCC)    Caused by fall   Tachy-brady syndrome (HCC)    a. s/p STJ dual chamber PPM    Thrombocytopenia    Viral respiratory illness 07/09/2014   Past Surgical History:  Procedure Laterality Date   CARDIOVERSION  05/17/2012   Procedure: CARDIOVERSION;  Surgeon: Toribio JONELLE Fuel, MD;  Location: [facility] ENDOSCOPY;   Service: Cardiovascular;  Laterality: [name];   CATARACT EXTRACTION W/ INTRAOCULAR LENS IMPLANT     right   EP IMPLANTABLE DEVICE [name] 11/12/2015   SJM Assurity MRI DR PPM implanted by Dr Fernande for tachy/brady syndrome   PACEMAKER IMPLANT     TEE WITHOUT CARDIOVERSION  05/17/2012   Procedure: TRANSESOPHAGEAL ECHOCARDIOGRAM (TEE);  Surgeon: Toribio JONELLE Fuel, MD;  Location[facility] ENDOSCOPY;  Service: Cardiovascular;  Laterality: [name];   TOTAL HIP ARTHROPLASTY     right   Social History:  reports that he has quit smoking. He quit smokeless tobacco use about 48 years ago. He reports that he does not drink alcohol and does not use drugs.  Allergies[1]  Family History  Problem Relation Age of Onset   Tuberculosis Father    Other Unknown        No known heart disease    Prior to Admission medications  Medication Sig Start Date End Date Taking? Authorizing Provider  acetaminophen  (TYLENOL ) 500 MG tablet Take 500 mg by mouth every 6 (six) hours as needed for mild pain (pain score 1-3).    [provider]  [Paused] atorvastatin  (LIPITOR) 40 MG tablet Take 1 tablet by mouth once daily Wait to take this until your doctor or other care provider tells you to start again. 12/28/23   Nishan, Peter C, MD  barrier cream (NON-SPECIFIED) CREA Apply 1  Application topically in the morning, at noon, and at bedtime. Apply to buttocks every shift    [provider]  cyanocobalamin  (VITAMIN B12) 1000 MCG tablet Take 1,000 mcg by mouth daily. 04/06/24   [provider]  INCRUSE ELLIPTA  62.5 MCG/ACT AEPB Inhale 1 puff into the lungs daily. 05/08/24   [provider]  metoprolol  tartrate (LOPRESSOR ) 50 MG tablet Take 1 tablet (50 mg total) by mouth 2 (two) times daily. 06/26/24   Danford, Lonni SQUIBB, MD  Multiple Vitamin (MULTIVITAMIN) tablet Take 1 tablet by mouth daily.    [provider]  Nutritional Supplements (BOOST VHC) LIQD Take 240 mLs by mouth 3 (three) times daily.     [provider]  polyethylene glycol (MIRALAX  / GLYCOLAX ) 17 g packet Take 17 g by mouth daily as needed for mild constipation.    [provider]    Physical Exam: Vitals:   08/15/24 1715 08/15/24 1745 08/15/24 1800 08/15/24 1815  BP: (!) 99/55  (!) 97/53   Pulse: 65 71 85 70  Resp: (!) 23 19 (!) 22 19  Temp:      TempSrc:      SpO2: 92% 97% 95% 99%   Constitutional: Chronically ill looking, confused, NAD, calm, comfortable Eyes: PERRL, lids and conjunctivae normal ENMT: Mucous membranes are dry posterior pharynx clear of any exudate or lesions.Normal dentition.  Neck: normal, supple, no masses, no thyromegaly Respiratory: clear to auscultation bilaterally, no wheezing, no crackles. Normal respiratory effort. No accessory muscle use.  Cardiovascular: Regular rate and rhythm, no murmurs / rubs / gallops. No extremity edema. 2+ pedal pulses. No carotid bruits.  Abdomen: no tenderness, no masses palpated. No hepatosplenomegaly. Bowel sounds positive.  Musculoskeletal: Good range of motion, no joint swelling or tenderness, Skin: no rashes, lesions, ulcers. No induration Neurologic: CN 2-12 grossly intact. Sensation intact, DTR normal. Strength 5/5 in all 4.  Psychiatric: Confused, normal mood  Data Reviewed:  Temperature 97.4, blood pressure 97/53, pulse 107 respiratory 24, white count 7.6 hemoglobin 13.3 platelets 108.  Sodium 139 BUN is 30 creatinine 1.70 calcium  8.5.  proBNP 7763.  Troponin is 46.  Acute viral screen is negative for influenza RSV and COVID-19.  Urinalysis showed large leukocytes.  WBC more than 50 and rare bacteria.  Chest x-ray showed no acute findings.  Assessment and Plan:  #1 generalized weakness and confusion: Possibly early UTI.  Could also be other medical issues including dehydration.  Patient will be admitted for observation.  Initiate IV Rocephin .  Hydrate patient.  Wait for urine and blood cultures.  Patient received IM Rocephin  few days  ago which may have altered his clinical course.  Has received a dose of Zosyn  in the ER.  #2 UTI: Await urine culture and sensitivity.  Continue empiric treatment  #3 atrial fibrillation: Rate is controlled.  Not on anticoagulation.  #4 acute metabolic encephalopathy: Secondary to UTI above.  Continue to monitor  #5 chronic diastolic heart failure: Appears compensated.  Continue to monitor  #6 history of COPD: No acute exacerbation.  #7 thrombocytopenia: No acute bleed.  Will be careful with anticoagulation.  #8 AKI: On CKD 3.  Continue to monitor  #9 hypotension: Has history of essential hypertension.  Will hold medications.     Advance Care Planning:   Code Status: Limited: Do not attempt resuscitation (DNR) -DNR-LIMITED -Do Not Intubate/DNI    Consults: None  Family Communication: Family at bedside  Severity of Illness: The appropriate patient status for this patient  is OBSERVATION. Observation status is judged to be reasonable and necessary in order to provide the required intensity of service to ensure the patient's safety. The patient's presenting symptoms, physical exam findings, and initial radiographic and laboratory data in the context of their medical condition is felt to place them at decreased risk for further clinical deterioration. Furthermore, it is anticipated that the patient will be medically stable for discharge from the hospital within 2 midnights of admission.   AuthorBETHA SIM KNOLL, MD 08/15/2024 6:21 PM  For on call review [email or web address].      [1] No Known Allergies  "

## 2024-08-15 NOTE — ED Provider Notes (Signed)
 " [place] EMERGENCY DEPARTMENT AT [facility] Provider Note   CSN: [id number] Arrival date & time: 08/15/24  1333     Patient presents with: Shortness of Breath and Weakness   Justin Baker is a 89 y.o. male.  He is brought in by ambulance from his facility.  He is a fair historian.  He said he was very weak and his legs would not hold him.  He does say he is short of breath but he says that is chronic.  Denies any chest pain or abdominal pain.  Sometimes has diarrhea.  No urinary symptoms.  Per EMS family wanted him evaluated for possible sepsis.  Poor p.o. intake and general weakness.   The history is provided by the patient.  Weakness Severity:  Severe Progression:  Unchanged Chronicity:  New Relieved by:  Nothing Worsened by:  Activity Associated symptoms: cough, diarrhea, difficulty walking and shortness of breath   Associated symptoms: no chest pain, no dysuria, no fever, no nausea and no vomiting        Prior to Admission medications  Medication Sig Start Date End Date Taking? Authorizing Provider  acetaminophen  (TYLENOL ) 500 MG tablet Take 500 mg by mouth every 6 (six) hours as needed for mild pain (pain score 1-3).    [provider]  [Paused] atorvastatin  (LIPITOR) 40 MG tablet Take 1 tablet by mouth once daily Wait to take this until your doctor or other care provider tells you to start again. 12/28/23   Nishan, Peter C, MD  barrier cream (NON-SPECIFIED) CREA Apply 1 Application topically in the morning, at noon, and at bedtime. Apply to buttocks every shift    [provider]  cyanocobalamin  (VITAMIN B12) 1000 MCG tablet Take 1,000 mcg by mouth daily. 04/06/24   [provider]  INCRUSE ELLIPTA  62.5 MCG/ACT AEPB Inhale 1 puff into the lungs daily. 05/08/24   [provider]  metoprolol  tartrate (LOPRESSOR ) 50 MG tablet Take 1 tablet (50 mg total) by mouth 2 (two) times daily. 06/26/24   Danford, Lonni SQUIBB, MD  Multiple  Vitamin (MULTIVITAMIN) tablet Take 1 tablet by mouth daily.    [provider]  Nutritional Supplements (BOOST VHC) LIQD Take 240 mLs by mouth 3 (three) times daily.    [provider]  polyethylene glycol (MIRALAX  / GLYCOLAX ) 17 g packet Take 17 g by mouth daily as needed for mild constipation.    [provider]    Allergies: Patient has no known allergies.    Review of Systems  Constitutional:  Negative for fever.  Respiratory:  Positive for cough and shortness of breath.   Cardiovascular:  Negative for chest pain.  Gastrointestinal:  Positive for diarrhea. Negative for nausea and vomiting.  Genitourinary:  Negative for dysuria.  Neurological:  Positive for weakness.    Updated Vital Signs BP (!) 100/55 (BP Location: Right Arm)   Pulse 69   Temp (!) 97.5 F (36.4 C) (Oral)   Resp 17   SpO2 95%   Physical Exam Vitals and nursing note reviewed.  Constitutional:      Appearance: Normal appearance. He is well-developed.  HENT:     Head: Normocephalic and atraumatic.  Eyes:     Conjunctiva/sclera: Conjunctivae normal.  Cardiovascular:     Rate and Rhythm: Normal rate. Rhythm irregular.     Heart sounds: No murmur heard. Pulmonary:     Effort: Pulmonary effort is normal. No respiratory distress.     Breath sounds: Wheezing and rhonchi  present.  Abdominal:     Palpations: Abdomen is soft.     Tenderness: There is no abdominal tenderness.  Musculoskeletal:     Cervical back: Neck supple.     Right lower leg: No tenderness. Edema present.     Left lower leg: No tenderness. Edema present.  Skin:    General: Skin is warm and dry.  Neurological:     General: No focal deficit present.     Mental Status: He is alert.     GCS: GCS eye subscore is 4. GCS verbal subscore is 5. GCS motor subscore is 6.     Comments: He is generally weak but is able to move all extremities to command.     (all labs ordered are listed, but only abnormal results are  displayed) Labs Reviewed  COMPREHENSIVE METABOLIC PANEL WITH GFR - Abnormal; Notable for the following components:      Result Value   Glucose, Bld 120 (*)    BUN 30 (*)    Creatinine, Ser 1.64 (*)    Calcium  8.5 (*)    Total Protein 6.1 (*)    Albumin  3.4 (*)    GFR, Estimated 37 (*)    All other components within normal limits  CBC WITH DIFFERENTIAL/PLATELET - Abnormal; Notable for the following components:   RBC 3.95 (*)    MCV 101.5 (*)    Platelets 108 (*)    All other components within normal limits  PRO BRAIN NATRIURETIC PEPTIDE - Abnormal; Notable for the following components:   Pro Brain Natriuretic Peptide 7,763.0 (*)    All other components within normal limits  URINALYSIS, W/ REFLEX TO CULTURE (INFECTION SUSPECTED) - Abnormal; Notable for the following components:   APPearance CLOUDY (*)    Protein, ur 30 (*)    Leukocytes,Ua LARGE (*)    Bacteria, UA RARE (*)    All other components within normal limits  I-STAT CHEM 8, ED - Abnormal; Notable for the following components:   BUN 30 (*)    Creatinine, Ser 1.70 (*)    Glucose, Bld 120 (*)    Calcium , Ion 1.13 (*)    TCO2 20 (*)    All other components within normal limits  TROPONIN T, HIGH SENSITIVITY - Abnormal; Notable for the following components:   Troponin T High Sensitivity 46 (*)    All other components within normal limits  TROPONIN T, HIGH SENSITIVITY - Abnormal; Notable for the following components:   Troponin T High Sensitivity 45 (*)    All other components within normal limits  RESP PANEL BY RT-PCR (RSV, FLU A&B, COVID)  RVPGX2  CULTURE, BLOOD (ROUTINE X 2)  CULTURE, BLOOD (ROUTINE X 2)  URINE CULTURE  PROTIME-INR  LIPASE, BLOOD  I-STAT CG4 LACTIC ACID, ED    EKG: EKG Interpretation Date/Time:  Wednesday August 15 2024 14:05:46 EST Ventricular Rate:  92 PR Interval:    QRS Duration:  93 QT Interval:  373 QTC Calculation: 462 R Axis:   -36  Text Interpretation: Atrial fibrillation Left  axis deviation Nonspecific T abnormalities, anterior leads No significant change since prior 11/25 Confirmed by Towana Sharper [phone]) on 08/15/2024 2:09:21 PM  Radiology: [facility] Chest Port 1 View Result Date: 08/15/2024 EXAM: 1 VIEW(S) XRAY OF THE CHEST 08/15/2024 02:17:30 PM COMPARISON: 06/20/2024 CLINICAL HISTORY: Questionable sepsis - evaluate for abnormality FINDINGS: LINES, TUBES AND DEVICES: Left chest wall dual lead cardiac pacemaker in place. LUNGS AND PLEURA: Low lung volumes. Elevated left hemidiaphragm. Coarsened interstitial  markings without pulmonary edema. No focal pulmonary opacity. No pleural effusion. No pneumothorax. HEART AND MEDIASTINUM: Aortic calcification. Left chest wall dual lead cardiac pacemaker in place. BONES AND SOFT TISSUES: No acute osseous abnormality. IMPRESSION: 1. No acute cardiopulmonary abnormality. 2. Low lung volumes with elevated left hemidiaphragm. Electronically signed by: Dayne Hassell MD 08/15/2024 02:49 PM EST RP Workstation: [id number]     Procedures   Medications Ordered in the ED  lactated ringers  bolus 1,000 mL (0 mLs Intravenous Stopped 08/15/24 1449)    Clinical Course as of 08/15/24 1735  Wed Aug 15, 2024  1427 Chest x-ray interpreted by me as possible right sided infiltrate.  Awaiting radiology reading. [MB]    Clinical Course User Index [MB] Towana Ozell BROCKS, MD                                 Medical Decision Making Amount and/or Complexity of Data Reviewed Labs: ordered. Radiology: ordered.   This patient complains of weakness, shortness of breath; this involves an extensive number of treatment Options and is a complaint that carries with it a high risk of complications and morbidity. The differential includes infection, dehydration, pneumonia, COVID, flu  I ordered, reviewed and interpreted labs, which included CBC with chronically low platelets, chemistries with slightly worse CKD, BNP elevated, troponins elevated, COVID and  flu negative, urinalysis possible signs of infection sent for culture I ordered medication IV fluids and reviewed PMP when indicated. I ordered imaging studies which included chest x-ray and I independently    visualized and interpreted imaging which showed no acute findings Previous records obtained and reviewed in epic including outpatient cardiology notes Cardiac monitoring reviewed, atrial fibrillation Social determinants considered, no significant barriers Critical Interventions: None  After the interventions stated above, I reevaluated the patient and found patient to be awake and alert, very hard of hearing.  Blood pressure soft here. Admission and further testing considered, his care is signed out to Dr. Pamella to follow-up on results of testing.  At his age feel he would benefit from admission unless family would like him returned back to his facility.  He is DNR/DNI.  So far his workup has been fairly unremarkable.      Final diagnoses:  Generalized weakness    ED Discharge Orders     None          Towana Ozell BROCKS, MD 08/15/24 1738  "

## 2024-08-15 NOTE — ED Notes (Signed)
 Verbal confirmation from 5c for pt transport to inpatient unit.

## 2024-08-15 NOTE — ED Provider Notes (Signed)
" °  Physical Exam  BP (!) 97/53   Pulse 70   Temp (!) 97.4 F (36.3 C) (Oral)   Resp 19   SpO2 99%   Physical Exam Vitals and nursing note reviewed.  HENT:     Head: Normocephalic and atraumatic.  Eyes:     Pupils: Pupils are equal, round, and reactive to light.  Cardiovascular:     Rate and Rhythm: Normal rate and regular rhythm.  Pulmonary:     Effort: Pulmonary effort is normal.     Breath sounds: Normal breath sounds.  Abdominal:     Palpations: Abdomen is soft.     Tenderness: There is no abdominal tenderness.  Skin:    General: Skin is warm and dry.  Neurological:     Mental Status: He is alert.  Psychiatric:        Mood and Affect: Mood normal.     Procedures  Procedures  ED Course / MDM   Clinical Course as of 08/15/24 1948  Wed Aug 15, 2024  1427 Chest x-ray interpreted by me as possible right sided infiltrate.  Awaiting radiology reading. [MB]  1818 Workup most consistent with UTI.  Concern for recurrent urosepsis.  Will cover with antibiotics.  Discussed with Dr. Sim who accept patient for admission to medicine [MP]    Clinical Course User Index [MB] Towana Ozell BROCKS, MD [MP] Pamella Ozell LABOR, DO   Medical Decision Making I, Ozell Pamella DO, have assumed care of this patient from the previous provider pending remainder of laboratory workup discussion with family reevaluation and disposition  Amount and/or Complexity of Data Reviewed Labs: ordered. Radiology: ordered.  Risk Prescription drug management. Decision regarding hospitalization.          Pamella Ozell LABOR, DO 08/15/24 1948  "

## 2024-08-15 NOTE — ED Triage Notes (Signed)
 Pt BIB [facility] from [facility] [place] due to family wanting him to be checked out for possible sepsis.  They stated he has been having generalized weakness and lower intake the past two days.  No other complaints. VS BP 110/54, HR 74, Resp 16, SpO2 97% RA CBG 133, Temp 97.9

## 2024-08-15 NOTE — ED Notes (Signed)
Phlebotomy to collect second set of blood cultures

## 2024-08-16 DIAGNOSIS — E860 Dehydration: Secondary | ICD-10-CM | POA: Diagnosis present

## 2024-08-16 DIAGNOSIS — Z66 Do not resuscitate: Secondary | ICD-10-CM | POA: Diagnosis present

## 2024-08-16 DIAGNOSIS — Z8616 Personal history of COVID-19: Secondary | ICD-10-CM | POA: Diagnosis not present

## 2024-08-16 DIAGNOSIS — G9341 Metabolic encephalopathy: Secondary | ICD-10-CM | POA: Diagnosis present

## 2024-08-16 DIAGNOSIS — Z1152 Encounter for screening for COVID-19: Secondary | ICD-10-CM | POA: Diagnosis not present

## 2024-08-16 DIAGNOSIS — I480 Paroxysmal atrial fibrillation: Secondary | ICD-10-CM | POA: Diagnosis present

## 2024-08-16 DIAGNOSIS — J189 Pneumonia, unspecified organism: Secondary | ICD-10-CM | POA: Diagnosis present

## 2024-08-16 DIAGNOSIS — I5032 Chronic diastolic (congestive) heart failure: Secondary | ICD-10-CM | POA: Diagnosis present

## 2024-08-16 DIAGNOSIS — N1832 Chronic kidney disease, stage 3b: Secondary | ICD-10-CM | POA: Diagnosis present

## 2024-08-16 DIAGNOSIS — R531 Weakness: Secondary | ICD-10-CM | POA: Diagnosis not present

## 2024-08-16 DIAGNOSIS — I495 Sick sinus syndrome: Secondary | ICD-10-CM | POA: Diagnosis present

## 2024-08-16 DIAGNOSIS — Z87891 Personal history of nicotine dependence: Secondary | ICD-10-CM | POA: Diagnosis not present

## 2024-08-16 DIAGNOSIS — R296 Repeated falls: Secondary | ICD-10-CM | POA: Diagnosis present

## 2024-08-16 DIAGNOSIS — D696 Thrombocytopenia, unspecified: Secondary | ICD-10-CM | POA: Diagnosis present

## 2024-08-16 DIAGNOSIS — H9190 Unspecified hearing loss, unspecified ear: Secondary | ICD-10-CM | POA: Diagnosis present

## 2024-08-16 DIAGNOSIS — R0602 Shortness of breath: Secondary | ICD-10-CM | POA: Diagnosis present

## 2024-08-16 DIAGNOSIS — J440 Chronic obstructive pulmonary disease with acute lower respiratory infection: Secondary | ICD-10-CM | POA: Diagnosis present

## 2024-08-16 DIAGNOSIS — Z831 Family history of other infectious and parasitic diseases: Secondary | ICD-10-CM | POA: Diagnosis not present

## 2024-08-16 DIAGNOSIS — Z87440 Personal history of urinary (tract) infections: Secondary | ICD-10-CM | POA: Diagnosis not present

## 2024-08-16 DIAGNOSIS — Z950 Presence of cardiac pacemaker: Secondary | ICD-10-CM | POA: Diagnosis not present

## 2024-08-16 DIAGNOSIS — Z79899 Other long term (current) drug therapy: Secondary | ICD-10-CM | POA: Diagnosis not present

## 2024-08-16 DIAGNOSIS — N390 Urinary tract infection, site not specified: Secondary | ICD-10-CM | POA: Diagnosis present

## 2024-08-16 DIAGNOSIS — Z96641 Presence of right artificial hip joint: Secondary | ICD-10-CM | POA: Diagnosis present

## 2024-08-16 DIAGNOSIS — Z8673 Personal history of transient ischemic attack (TIA), and cerebral infarction without residual deficits: Secondary | ICD-10-CM | POA: Diagnosis not present

## 2024-08-16 DIAGNOSIS — I130 Hypertensive heart and chronic kidney disease with heart failure and stage 1 through stage 4 chronic kidney disease, or unspecified chronic kidney disease: Secondary | ICD-10-CM | POA: Diagnosis present

## 2024-08-16 LAB — COMPREHENSIVE METABOLIC PANEL WITH GFR
ALT: 11 U/L (ref 0–44)
AST: 26 U/L (ref 15–41)
Albumin: 3.1 g/dL — ABNORMAL LOW (ref 3.5–5.0)
Alkaline Phosphatase: 68 U/L (ref 38–126)
Anion gap: 11 (ref 5–15)
BUN: 29 mg/dL — ABNORMAL HIGH (ref 8–23)
CO2: 21 mmol/L — ABNORMAL LOW (ref 22–32)
Calcium: 8.4 mg/dL — ABNORMAL LOW (ref 8.9–10.3)
Chloride: 105 mmol/L (ref 98–111)
Creatinine, Ser: 1.49 mg/dL — ABNORMAL HIGH (ref 0.61–1.24)
GFR, Estimated: 42 mL/min — ABNORMAL LOW
Glucose, Bld: 92 mg/dL (ref 70–99)
Potassium: 4.2 mmol/L (ref 3.5–5.1)
Sodium: 137 mmol/L (ref 135–145)
Total Bilirubin: 0.5 mg/dL (ref 0.0–1.2)
Total Protein: 5.7 g/dL — ABNORMAL LOW (ref 6.5–8.1)

## 2024-08-16 LAB — CBC
HCT: 38.5 % — ABNORMAL LOW (ref 39.0–52.0)
Hemoglobin: 12.8 g/dL — ABNORMAL LOW (ref 13.0–17.0)
MCH: 33.4 pg (ref 26.0–34.0)
MCHC: 33.2 g/dL (ref 30.0–36.0)
MCV: 100.5 fL — ABNORMAL HIGH (ref 80.0–100.0)
Platelets: 91 K/uL — ABNORMAL LOW (ref 150–400)
RBC: 3.83 MIL/uL — ABNORMAL LOW (ref 4.22–5.81)
RDW: 13.8 % (ref 11.5–15.5)
WBC: 8.5 K/uL (ref 4.0–10.5)
nRBC: 0 % (ref 0.0–0.2)

## 2024-08-16 MED ORDER — HYDROCODONE-ACETAMINOPHEN 5-325 MG PO TABS
1.0000 | ORAL_TABLET | ORAL | Status: DC | PRN
  Administered 2024-08-16: 2 via ORAL
  Filled 2024-08-16: qty 2

## 2024-08-16 MED ORDER — ACETAMINOPHEN 650 MG RE SUPP: 650.0000 mg | Freq: Four times a day (QID) | RECTAL | Status: DC | PRN

## 2024-08-16 MED ORDER — ACETAMINOPHEN 325 MG PO TABS
650.0000 mg | ORAL_TABLET | Freq: Four times a day (QID) | ORAL | Status: DC | PRN
  Administered 2024-08-20: 650 mg via ORAL
  Filled 2024-08-16: qty 2

## 2024-08-16 MED ORDER — MORPHINE SULFATE (PF) 2 MG/ML IV SOLN: 2.0000 mg | INTRAVENOUS | Status: DC | PRN

## 2024-08-16 MED ORDER — DOCUSATE SODIUM 100 MG PO CAPS
100.0000 mg | ORAL_CAPSULE | Freq: Two times a day (BID) | ORAL | Status: DC
  Administered 2024-08-16 – 2024-08-17 (×3): 100 mg via ORAL
  Filled 2024-08-16 (×5): qty 1

## 2024-08-16 MED ORDER — MELATONIN 3 MG PO TABS
3.0000 mg | ORAL_TABLET | Freq: Every day | ORAL | Status: DC
  Administered 2024-08-16 – 2024-08-20 (×4): 3 mg via ORAL
  Filled 2024-08-16 (×5): qty 1

## 2024-08-16 NOTE — Progress Notes (Signed)
 [facility] Progress Note Patient: Justin Baker FMW:[id number] DOB: August 16, 1922  DOA: 08/15/2024 DOS: the patient was seen and examined on 08/16/2024  Brief Hospital Course: Patient with PMH of COPD, CVA, SDH, chronic thrombocytopenia, paroxysmal A-fib present to the hospital with complaints of confusion and shortness of breath. Admitted for concern for UTI.  Treated with IM Rocephin  outpatient. Possible pneumonia seen in the chest x-ray. Currently continue with antibiotics. Assessment and Plan: Possible pneumonia. Possible UTI. Currently on IV antibiotic. Gentle IV hydration. Denies any acute complaint.  Generalized weakness and deconditioning. Primary reason for presentation from facility. Able to follow commands.  Able to answer questions although not oriented well. Unsure about baseline. For now we will monitor.  Paroxysmal A-fib. Currently rate controlled. Not on any anticoagulation due to SDH and recurrent falls.  Acute metabolic encephalopathy. Likely in the setting of dehydration and UTI.  Monitor.  Chronic HFpEF. [place] is adequate. Monitor.  History of COPD. At present no evidence of exacerbation. Continue current inhalers.  Chronic thrombocytopenia. Monitor. Holding DVT prophylaxis.  CKD 3B. No evidence of AKI. Baseline appears to be around 1.4-1.7. Upon admission serum creatinine was 1.7. Monitor.  HTN. Blood pressure is soft right now. Holding medication.   Subjective: No nausea no vomiting no fever no chills.  Minimal oral intake.  Unable to follow commands consistently.  Physical Exam: Basal crackles. No wheezing. S1-S2 present.  Some bowel sound present No edema. Bilateral equal strength.  Alert awake and oriented to person.  Adequate motor strength.  No asterixis.  Data Reviewed: I have Reviewed nursing notes, Vitals, and Lab results. Since last encounter, pertinent lab results CBC and BMP   . I have ordered test including CBC and  BMP  .   Disposition: Status is: Inpatient Remains inpatient appropriate because: Monitor for improvement in mentation.  Place and maintain sequential compression device Start: 08/16/24 0842   Family Communication: No one at bedside Level of care: Med-Surg   Vitals:   08/16/24 0530 08/16/24 0820 08/16/24 1121 08/16/24 1648  BP: 114/66 107/65 105/64 (!) 102/50  Pulse: 78 63 64 66  Resp: 17 18 20 18   Temp: 98.7 F (37.1 C) 98.7 F (37.1 C) 97.9 F (36.6 C) 97.9 F (36.6 C)  TempSrc: Oral Oral Oral Oral  SpO2: 97% 95% 95% 95%     Author: Yetta Blanch, MD 08/16/2024 5:56 PM  Please look on [email or web address] to find out who is on call.

## 2024-08-16 NOTE — Hospital Course (Addendum)
 Patient with PMH of COPD, CVA, SDH, chronic thrombocytopenia, paroxysmal A-fib present to the hospital with complaints of confusion and shortness of breath. Admitted for concern for UTI.  Treated with IM Rocephin  outpatient. Possible pneumonia seen in the chest x-ray. Currently continue with antibiotics. Assessment and Plan: Community-acquired pneumonia. Treated with IV antibiotic IV fluid. Switching to oral antibiotic. Monitor. Cultures are negative  Generalized weakness and deconditioning. Primary reason for presentation from facility. Able to follow commands.  Able to answer questions although not oriented well. Very close to baseline.  Paroxysmal A-fib. Currently rate controlled. Not on any anticoagulation due to SDH and recurrent falls.  Acute metabolic encephalopathy. Likely in the setting of dehydration and UTI.  Now resolved.  Chronic HFpEF. Volume status is adequate.  History of COPD. At present no evidence of exacerbation. Continue current inhalers.  Chronic thrombocytopenia. Monitor. Holding chemical DVT prophylaxis.  CKD 3B. No evidence of AKI. Baseline appears to be around 1.4-1.7. Upon admission serum creatinine was 1.7. Monitor.  HTN. Treated with fluid due to hypotension blood pressure better now.  Monitor.

## 2024-08-16 NOTE — Plan of Care (Signed)

## 2024-08-16 NOTE — TOC Initial Note (Signed)
 Transition of Care [facility]) - Initial/Assessment Note    Patient Details  Name: Justin Baker MRN: [id number] Date of Birth: 04-14-1922  [facility]) CM/SW Contact:    Lauraine FORBES Saa, LCSWA Phone Number: 08/16/2024, 1:39 PM  Clinical Narrative:                  1:39 PM Per chart review, patient is from [facility]. CSW to send patient's FL2 to SNF upon PT/OT evaluation. Patient has a PCP and insurance. CSW will continue to follow.  Expected Discharge Plan: Long Term Nursing Home Barriers to Discharge: Continued Medical Work up   Patient Goals and CMS Choice            Expected Discharge Plan and Services In-house Referral: Clinical Social Work   Post Acute Care Choice: Nursing Home, Skilled Nursing Facility Living arrangements for the past 2 months: Skilled Nursing Facility                                      Prior Living Arrangements/Services Living arrangements for the past 2 months: Skilled Nursing Facility Lives with:: Facility Resident Patient language and need for interpreter reviewed:: Yes        Need for Family Participation in Patient Care: Yes (Comment) Care giver support system in place?: Yes (comment) Current home services: DME Criminal Activity/Legal Involvement Pertinent to Current Situation/Hospitalization: No - Comment as needed  Activities of Daily Living      Permission Sought/Granted Permission sought to share information with : Facility Medical Sales Representative, Family Supports Permission granted to share information with : No (Contact information on chart)  Share Information with NAME: Axyl Sitzman.  Permission granted to share info w AGENCY: [facility]  Permission granted to share info w Relationship: Son  Permission granted to share info w Contact Information: [phone]  Emotional Assessment   Attitude/Demeanor/Rapport: Unable to Assess Affect (typically observed): Unable to Assess Orientation: :  Oriented to Self, Oriented to Place Alcohol / Substance Use: Not Applicable Psych Involvement: No (comment)  Admission diagnosis:  Lower urinary tract infectious disease [N39.0] Weakness generalized [R53.1] Generalized weakness [R53.1] Patient Active Problem List   Diagnosis Date Noted   Generalized weakness 08/16/2024   Weakness generalized 08/15/2024   Cerebrovascular disease 06/21/2024   Severe sepsis with end organ damage due to complicated UTI(HCC) 06/20/2024   Blind 10/25/2022   History of COPD 10/24/2022   Pulmonary emphysema (HCC) 10/23/2022   Chronic diastolic CHF (congestive heart failure) (HCC) 10/23/2022   Macrocytic anemia with vitamin B12 deficiency 10/22/2022   Bilateral lower extremity edema 08/19/2022   Sepsis (HCC) 05/19/2022   Diverticulitis    Pneumonia of both lower lobes due to infectious organism 05/18/2022   UTI (urinary tract infection) 05/18/2022   Hypokalemia 05/18/2022   Acute encephalopathy 05/18/2022   Persistent atrial fibrillation with RVR 09/23/2021   Secondary hypercoagulable state 09/23/2021   SAH (subarachnoid hemorrhage) (HCC) 10/29/2019   Chronic anticoagulation 10/29/2019   Sick sinus syndrome (HCC)    Cerebrovascular accident (CVA) due to embolism of left middle cerebral artery (HCC) 05/22/2015   Receptive dysphasia 05/21/2015   Decreased cardiac ejection fraction 07/03/2012   AKI (acute kidney injury) 05/16/2012   Atrial fibrillation with rapid ventricular response (HCC) 05/15/2012   Shoulder pain, bilateral 05/15/2012   PCP:  Almarie Waddell NOVAK, NP Pharmacy:   [facility] [place],  [place] - [address] [place] [address] Phone: [phone] Fax: [phone]     Social Drivers of Health (SDOH) Social History: SDOH Screenings   Food Insecurity: Patient Declined (08/16/2024)  Housing: Unknown (08/16/2024)  Transportation Needs: No Transportation Needs (08/16/2024)   Utilities: Patient Declined (08/16/2024)  Depression (PHQ2-9): Low Risk (08/19/2022)  Financial Resource Strain: Low Risk (02/14/2023)  Physical Activity: Unknown (02/14/2023)  Social Connections: Unknown (08/16/2024)  Stress: No Stress Concern Present (02/14/2023)  Tobacco Use: Medium Risk (06/25/2024)   SDOH Interventions:     Readmission Risk Interventions     No data to display

## 2024-08-17 DIAGNOSIS — R531 Weakness: Secondary | ICD-10-CM | POA: Diagnosis not present

## 2024-08-17 LAB — COMPREHENSIVE METABOLIC PANEL WITH GFR
ALT: 14 U/L (ref 0–44)
AST: 29 U/L (ref 15–41)
Albumin: 3 g/dL — ABNORMAL LOW (ref 3.5–5.0)
Alkaline Phosphatase: 71 U/L (ref 38–126)
Anion gap: 9 (ref 5–15)
BUN: 27 mg/dL — ABNORMAL HIGH (ref 8–23)
CO2: 20 mmol/L — ABNORMAL LOW (ref 22–32)
Calcium: 8.1 mg/dL — ABNORMAL LOW (ref 8.9–10.3)
Chloride: 105 mmol/L (ref 98–111)
Creatinine, Ser: 1.39 mg/dL — ABNORMAL HIGH (ref 0.61–1.24)
GFR, Estimated: 45 mL/min — ABNORMAL LOW
Glucose, Bld: 100 mg/dL — ABNORMAL HIGH (ref 70–99)
Potassium: 4.4 mmol/L (ref 3.5–5.1)
Sodium: 135 mmol/L (ref 135–145)
Total Bilirubin: 0.5 mg/dL (ref 0.0–1.2)
Total Protein: 5.8 g/dL — ABNORMAL LOW (ref 6.5–8.1)

## 2024-08-17 LAB — CBC WITH DIFFERENTIAL/PLATELET
Abs Immature Granulocytes: 0.02 K/uL (ref 0.00–0.07)
Basophils Absolute: 0 K/uL (ref 0.0–0.1)
Basophils Relative: 1 %
Eosinophils Absolute: 0 K/uL (ref 0.0–0.5)
Eosinophils Relative: 1 %
HCT: 38.5 % — ABNORMAL LOW (ref 39.0–52.0)
Hemoglobin: 12.8 g/dL — ABNORMAL LOW (ref 13.0–17.0)
Immature Granulocytes: 0 %
Lymphocytes Relative: 42 %
Lymphs Abs: 2.3 K/uL (ref 0.7–4.0)
MCH: 33.3 pg (ref 26.0–34.0)
MCHC: 33.2 g/dL (ref 30.0–36.0)
MCV: 100.3 fL — ABNORMAL HIGH (ref 80.0–100.0)
Monocytes Absolute: 0.4 K/uL (ref 0.1–1.0)
Monocytes Relative: 7 %
Neutro Abs: 2.7 K/uL (ref 1.7–7.7)
Neutrophils Relative %: 49 %
Platelets: 82 K/uL — ABNORMAL LOW (ref 150–400)
RBC: 3.84 MIL/uL — ABNORMAL LOW (ref 4.22–5.81)
RDW: 13.7 % (ref 11.5–15.5)
WBC: 5.4 K/uL (ref 4.0–10.5)
nRBC: 0 % (ref 0.0–0.2)

## 2024-08-17 LAB — LACTIC ACID, PLASMA: Lactic Acid, Venous: 1.4 mmol/L (ref 0.5–1.9)

## 2024-08-17 LAB — MAGNESIUM: Magnesium: 1.8 mg/dL (ref 1.7–2.4)

## 2024-08-17 MED ORDER — SODIUM CHLORIDE 0.9 % IV SOLN: INTRAVENOUS | Status: DC

## 2024-08-17 MED ORDER — SODIUM CHLORIDE 0.9 % IV BOLUS
1000.0000 mL | Freq: Once | INTRAVENOUS | Status: AC
  Administered 2024-08-17: 1000 mL via INTRAVENOUS

## 2024-08-17 MED ORDER — HYDROCODONE-ACETAMINOPHEN 5-325 MG PO TABS
1.0000 | ORAL_TABLET | Freq: Four times a day (QID) | ORAL | Status: DC | PRN
  Administered 2024-08-19: 1 via ORAL
  Filled 2024-08-17: qty 1

## 2024-08-17 NOTE — TOC Progression Note (Signed)
 Transition of Care [facility]) - Progression Note    Patient Details  Name: Justin Baker MRN: [id number] Date of Birth: 1921/09/13  [facility]) CM/SW Contact  Lauraine FORBES Saa, LCSWA Phone Number: 08/17/2024, 1:33 PM  Clinical Narrative:     1:33 PM CSW sent patient's FL2 to [facility]. CSW will continue to follow.  Expected Discharge Plan: Long Term Nursing Home Barriers to Discharge: Continued Medical Work up               Expected Discharge Plan and Services In-house Referral: Clinical Social Work   Post Acute Care Choice: Nursing Home, Skilled Nursing Facility Living arrangements for the past 2 months: Skilled Nursing Facility                                       Social Drivers of Health (SDOH) Interventions SDOH Screenings   Food Insecurity: Patient Declined (08/16/2024)  Housing: Unknown (08/16/2024)  Transportation Needs: No Transportation Needs (08/16/2024)  Utilities: Patient Declined (08/16/2024)  Depression (PHQ2-9): Low Risk (08/19/2022)  Financial Resource Strain: Low Risk (02/14/2023)  Physical Activity: Unknown (02/14/2023)  Social Connections: Unknown (08/16/2024)  Stress: No Stress Concern Present (02/14/2023)  Tobacco Use: Medium Risk (06/25/2024)    Readmission Risk Interventions     No data to display

## 2024-08-17 NOTE — Evaluation (Signed)
 Physical Therapy Brief Evaluation and Discharge Note Patient Details Name: Justin Baker MRN: [id number] DOB: April 09, 1922 Today's Date: 08/17/2024   History of Present Illness  Pt is a 89 y.o. male who presented 12/24 from nursing home  with complaints of confusion and shortness of breath.  Admitted for concern for UTI and PNA.  PMH:  A-fib, SSS, PPM, HFpEF, CVA, SAH, COPD, CKD3b, recent hx of Pseudomonas stutzeri and Actinotignum schaalii bacteremia   Clinical Impression  Pt admitted with above diagnosis. Reports no longer working with PT at SNF as he reached a plateau in function. Able to transfer to w/c with staff assist; dependent with ADLs. Pt required up to min assist for bed mobility today. Performed lateral scoot along bed, declines to get OOB. Significant help to clear buttocks from bed with scoot. Pt and son decline starting PT again once he returns to SNF. Will defer to their team to determine functional change and need in any additional therapy services once he returns. Suspect he is at baseline based on today's assessment and pt/family reported PLOF. Pt currently with functional limitations due to the deficits listed below (see PT Problem List). Pt will benefit from acute skilled PT to increase their independence and safety with mobility to allow discharge.          PT Assessment Patient does not need any further PT services  Assistance Needed at Discharge  Intermittent Supervision/Assistance    Equipment Recommendations None recommended by PT  Recommendations for Other Services       Precautions/Restrictions Precautions Precautions: Fall Recall of Precautions/Restrictions: Impaired Restrictions Weight Bearing Restrictions Per Provider Order: No        Mobility  Bed Mobility   Supine/Sidelying to sit: Min assist, HOB elevated, Used rails Sit to supine/sidelying: Contact guard assist General bed mobility comments: Up to min assit for patient to pull through  therapist's hand to rise and scoot to EOB. CGA to return to supine. Min assist to scoot up in bed using rails.  Transfers Overall transfer level: Needs assistance Equipment used: 1 person hand held assist Transfers: Sit to/from Stand Sit to Stand: Total assist           General transfer comment: Several attempts to stand and scoot along bed to reposition. Only able to clear buttocks once out of multiple attempts. Needs tactile cues to facilitate forward weight shift.    Ambulation/Gait           General Gait Details: Per family, pt non-ambulatory  Home Activity Instructions    Stairs            Modified Rankin (Stroke Patients Only)        Balance Overall balance assessment: Needs assistance Sitting-balance support: No upper extremity supported, Feet supported Sitting balance-Leahy Scale: Good                    Pertinent Vitals/Pain   Pain Assessment Pain Assessment: No/denies pain     Home Living Family/patient expects to be discharged to:: Skilled nursing facility             Additional Comments: From SNF where he is no longer receiving PT services due to reaching a plateau.    Prior Function Level of Independence: Needs assistance Comments: Pt can reportedly pivot to w/c with staff assist; requires assist for all ADLs.    UE/LE Assessment        LE ROM/Strength/Tone/Coordination: Generalized weakness      Communication   Communication  Communication: Impaired Factors Affecting Communication: Hearing impaired     Cognition Overall Cognitive Status: History of cognitive impairments - further impaired       General Comments General comments (skin integrity, edema, etc.): Son present briefly to confirm hx. Both decline continued therapy once he returns to SNF.    Exercises     Assessment/Plan    PT Problem List         PT Visit Diagnosis Other abnormalities of gait and mobility (R26.89);Muscle weakness (generalized)  (M62.81);Difficulty in walking, not elsewhere classified (R26.2);Other symptoms and signs involving the nervous system (R29.898)    No Skilled PT Patient at baseline level of functioning;Patient will have necessary level of assist by caregiver at discharge   Co-evaluation                AMPAC 6 Clicks Help needed turning from your back to your side while in a flat bed without using bedrails?: A Little Help needed moving from lying on your back to sitting on the side of a flat bed without using bedrails?: A Little Help needed moving to and from a bed to a chair (including a wheelchair)?: Total Help needed standing up from a chair using your arms (e.g., wheelchair or bedside chair)?: Total Help needed to walk in hospital room?: Total Help needed climbing 3-5 steps with a railing? : Total 6 Click Score: 10      End of Session Equipment Utilized During Treatment: Gait belt Activity Tolerance: Patient tolerated treatment well Patient left: in bed;with call bell/phone within reach;with bed alarm set;with family/visitor present (Lab in room) Nurse Communication: Mobility status;Need for lift equipment (Suggest trying [place]) PT Visit Diagnosis: Other abnormalities of gait and mobility (R26.89);Muscle weakness (generalized) (M62.81);Difficulty in walking, not elsewhere classified (R26.2);Other symptoms and signs involving the nervous system (R29.898)     Time: 8854-8797 PT Time Calculation (min) (ACUTE ONLY): 17 min  Charges:   PT Evaluation $PT Eval Low Complexity: 1 Low      Leontine Roads, PT, DPT [facility]  Re[facility] Services Physical Therapist Office: [phone] Website: [email or web address]   Leontine GORMAN Roads  08/17/2024, 12:58 PM

## 2024-08-17 NOTE — Plan of Care (Signed)

## 2024-08-17 NOTE — Progress Notes (Signed)
 [facility] Progress Note Patient: Justin Baker FMW:[id number] DOB: 08-03-1922  DOA: 08/15/2024 DOS: the patient was seen and examined on 08/17/2024  Brief Hospital Course: Patient with PMH of COPD, CVA, SDH, chronic thrombocytopenia, paroxysmal A-fib present to the hospital with complaints of confusion and shortness of breath. Admitted for concern for UTI.  Treated with IM Rocephin  outpatient. Possible pneumonia seen in the chest x-ray. Currently continue with antibiotics. Assessment and Plan: Possible pneumonia. Possible UTI. Currently on IV antibiotic. Will provide aggressive IV hydration.  Feeling better. Denies any acute complaint.  Generalized weakness and deconditioning. Primary reason for presentation from facility. Able to follow commands.  Able to answer questions although not oriented well. Unsure about baseline. For now we will monitor.  Paroxysmal A-fib. Currently rate controlled. Not on any anticoagulation due to SDH and recurrent falls.  Acute metabolic encephalopathy. Likely in the setting of dehydration and UTI.  Monitor.  Chronic HFpEF. Volume status is adequate. Monitor.  Especially while receiving IV fluid.  History of COPD. At present no evidence of exacerbation. Continue current inhalers.  Chronic thrombocytopenia. Monitor. Holding DVT prophylaxis.  CKD 3B. No evidence of AKI. Baseline appears to be around 1.4-1.7. Upon admission serum creatinine was 1.7. Monitor.  HTN. Blood pressure is soft right now. Given IV fluid bolus.  Now improving. Monitor.  Transfer to telemetry.   Subjective: No nausea no vomiting No chills but blood pressure was soft with some dizziness.  Physical Exam: Clear to auscultation. S1-S2 present Bowel sound present No edema. No focal deficit.  Data Reviewed: I have Reviewed nursing notes, Vitals, and Lab results. Since last encounter, pertinent lab results CBC and BMP   . I have ordered test including  CBC and BMP  .   Disposition: Status is: Inpatient Remains inpatient appropriate because: Monitor for improvement in blood pressure  Place and maintain sequential compression device Start: 08/16/24 0842  Family Communication: Son at bedside Level of care: Telemetry   Vitals:   08/17/24 0821 08/17/24 0855 08/17/24 0856 08/17/24 1207  BP: (!) 86/47 107/75 100/64 107/66  Pulse:  83    Resp:  18  17  Temp:      TempSrc:      SpO2:  99%       Author: Yetta Blanch, MD 08/17/2024 6:39 PM  Please look on [email or web address] to find out who is on call.

## 2024-08-17 NOTE — Progress Notes (Signed)
 Report called to 5 M to Precious. Patient will be transferred and be on telemetry

## 2024-08-17 NOTE — Progress Notes (Signed)
 BP 86/47, HR fluctuating between 37-120. Dr Tobie notified, bolus ordered. Will monitor BP. Bolus to be given

## 2024-08-18 ENCOUNTER — Inpatient Hospital Stay (HOSPITAL_COMMUNITY)

## 2024-08-18 DIAGNOSIS — R531 Weakness: Secondary | ICD-10-CM | POA: Diagnosis not present

## 2024-08-18 LAB — CBC
HCT: 36.8 % — ABNORMAL LOW (ref 39.0–52.0)
Hemoglobin: 12.6 g/dL — ABNORMAL LOW (ref 13.0–17.0)
MCH: 34.1 pg — ABNORMAL HIGH (ref 26.0–34.0)
MCHC: 34.2 g/dL (ref 30.0–36.0)
MCV: 99.5 fL (ref 80.0–100.0)
Platelets: 84 K/uL — ABNORMAL LOW (ref 150–400)
RBC: 3.7 MIL/uL — ABNORMAL LOW (ref 4.22–5.81)
RDW: 13.5 % (ref 11.5–15.5)
WBC: 5 K/uL (ref 4.0–10.5)
nRBC: 0 % (ref 0.0–0.2)

## 2024-08-18 LAB — BASIC METABOLIC PANEL WITH GFR
Anion gap: 8 (ref 5–15)
BUN: 29 mg/dL — ABNORMAL HIGH (ref 8–23)
CO2: 22 mmol/L (ref 22–32)
Calcium: 7.8 mg/dL — ABNORMAL LOW (ref 8.9–10.3)
Chloride: 109 mmol/L (ref 98–111)
Creatinine, Ser: 1.3 mg/dL — ABNORMAL HIGH (ref 0.61–1.24)
GFR, Estimated: 49 mL/min — ABNORMAL LOW
Glucose, Bld: 104 mg/dL — ABNORMAL HIGH (ref 70–99)
Potassium: 4.4 mmol/L (ref 3.5–5.1)
Sodium: 139 mmol/L (ref 135–145)

## 2024-08-18 LAB — MAGNESIUM: Magnesium: 1.9 mg/dL (ref 1.7–2.4)

## 2024-08-18 MED ORDER — FINASTERIDE 5 MG PO TABS
5.0000 mg | ORAL_TABLET | Freq: Every day | ORAL | Status: DC
  Administered 2024-08-18 – 2024-08-21 (×4): 5 mg via ORAL
  Filled 2024-08-18 (×3): qty 1

## 2024-08-18 MED ORDER — SENNOSIDES-DOCUSATE SODIUM 8.6-50 MG PO TABS
2.0000 | ORAL_TABLET | Freq: Two times a day (BID) | ORAL | Status: DC
  Administered 2024-08-18 – 2024-08-21 (×7): 2 via ORAL
  Filled 2024-08-18 (×6): qty 2

## 2024-08-18 MED ORDER — CEFADROXIL 500 MG PO CAPS
500.0000 mg | ORAL_CAPSULE | Freq: Two times a day (BID) | ORAL | Status: AC
  Administered 2024-08-18 – 2024-08-19 (×4): 500 mg via ORAL
  Filled 2024-08-18 (×4): qty 1

## 2024-08-18 MED ORDER — TAMSULOSIN HCL 0.4 MG PO CAPS
0.4000 mg | ORAL_CAPSULE | Freq: Every day | ORAL | Status: DC
  Administered 2024-08-18 – 2024-08-20 (×3): 0.4 mg via ORAL
  Filled 2024-08-18 (×3): qty 1

## 2024-08-18 MED ORDER — AZITHROMYCIN 500 MG PO TABS
500.0000 mg | ORAL_TABLET | Freq: Every day | ORAL | Status: AC
  Administered 2024-08-18 – 2024-08-20 (×3): 500 mg via ORAL
  Filled 2024-08-18 (×3): qty 1

## 2024-08-18 NOTE — Progress Notes (Signed)
 OT Cancellation Note  Patient Details Name: Ky Rumple MRN: [id number] DOB: [birth year]   Cancelled Treatment:    Reason Eval/Treat Not Completed: OT screened, no needs identified, will sign off.  Per discussion with PT who saw patient and family yesterday, pt is close to functional baseline which has been declining and he is dependent for ADLs at SNF and only completing transfers bed to wheelchair.  They were not interested in further therapy interventions per conversation with PT.  Will sign off.  If family/pt change their mind, please re-order.  Thanks  Delane Stalling OTR/L 08/18/2024, 8:13 AM

## 2024-08-18 NOTE — Progress Notes (Signed)
 Pt eating lunch.  Son at bedside requested to come place PIV after he finishes eating.

## 2024-08-18 NOTE — Progress Notes (Signed)
 [facility] Progress Note Patient: Justin Baker FMW:[id number] DOB: 03/20/1922  DOA: 08/15/2024 DOS: the patient was seen and examined on 08/18/2024  Brief Hospital Course: Patient with PMH of COPD, CVA, SDH, chronic thrombocytopenia, paroxysmal A-fib present to the hospital with complaints of confusion and shortness of breath. Admitted for concern for UTI.  Treated with IM Rocephin  outpatient. Possible pneumonia seen in the chest x-ray. Currently continue with antibiotics. Assessment and Plan: Possible pneumonia. Possible UTI. Currently on IV antibiotic.  Switching to oral antibiotic. Denies any acute complaint.  Generalized weakness and deconditioning. Primary reason for presentation from facility. Able to follow commands.  Able to answer questions although not oriented well. Unsure about baseline. For now we will monitor.  Paroxysmal A-fib. Currently rate controlled. Not on any anticoagulation due to SDH and recurrent falls.  Acute metabolic encephalopathy. Likely in the setting of dehydration and UTI.  Monitor.  Chronic HFpEF. Volume status is adequate. Monitor.  Especially while receiving IV fluid.  History of COPD. At present no evidence of exacerbation. Continue current inhalers.  Chronic thrombocytopenia. Monitor. Holding DVT prophylaxis.  CKD 3B. No evidence of AKI. Baseline appears to be around 1.4-1.7. Upon admission serum creatinine was 1.7. Monitor.  HTN. Blood pressure improved after IV hydration. Monitor. Monitor on telemetry.  Subjective: Complains about food.  No nausea no vomiting.  No dizziness.  Has some cough.  Physical Exam: Clear to auscultation.  No wheezing. S1-S2 present Bowel sound present No edema. No focal deficit.  Data Reviewed: I have Reviewed nursing notes, Vitals, and Lab results. Reviewed CBC and BMP. Reordered CBC and BMP.  Disposition: Status is: Inpatient Remains inpatient appropriate because: Monitor for  improvement in blood pressure  Place and maintain sequential compression device Start: 08/16/24 0842  Family Communication: Son at bedside Level of care: Telemetry   Vitals:   08/18/24 0912 08/18/24 1204 08/18/24 1556 08/18/24 1816  BP: 128/75 121/67 106/63 (!) 134/93  Pulse: 70 86 74 76  Resp: 17 17 17    Temp: (!) 97.4 F (36.3 C) 98.1 F (36.7 C) 98.1 F (36.7 C) 97.6 F (36.4 C)  TempSrc: Oral Oral Oral Oral  SpO2: 96% 94% 93% 94%     Author: Yetta Blanch, MD 08/18/2024 6:22 PM  Please look on [email or web address] to find out who is on call.

## 2024-08-18 NOTE — Progress Notes (Signed)
 AT bedside to attempt PIV, pt not in room.

## 2024-08-18 NOTE — Plan of Care (Signed)

## 2024-08-18 NOTE — Plan of Care (Signed)
  Problem: Education: Goal: Knowledge of General Education information will improve Description: Including pain rating scale, medication(s)/side effects and non-pharmacologic comfort measures Outcome: Progressing   Problem: Clinical Measurements: Goal: Ability to maintain clinical measurements within normal limits will improve Outcome: Progressing Goal: Will remain free from infection Outcome: Progressing Goal: Diagnostic test results will improve Outcome: Progressing Goal: Respiratory complications will improve Outcome: Progressing Goal: Cardiovascular complication will be avoided Outcome: Progressing   Problem: Activity: Goal: Risk for activity intolerance will decrease Outcome: Progressing   Problem: Coping: Goal: Level of anxiety will decrease Outcome: Progressing   Problem: Elimination: Goal: Will not experience complications related to bowel motility Outcome: Progressing Goal: Will not experience complications related to urinary retention Outcome: Progressing   Problem: Pain Managment: Goal: General experience of comfort will improve and/or be controlled Outcome: Progressing

## 2024-08-19 ENCOUNTER — Encounter (HOSPITAL_COMMUNITY): Payer: Self-pay | Admitting: Internal Medicine

## 2024-08-19 DIAGNOSIS — R531 Weakness: Secondary | ICD-10-CM | POA: Diagnosis not present

## 2024-08-19 LAB — BASIC METABOLIC PANEL WITH GFR
Anion gap: 8 (ref 5–15)
BUN: 27 mg/dL — ABNORMAL HIGH (ref 8–23)
CO2: 22 mmol/L (ref 22–32)
Calcium: 8.3 mg/dL — ABNORMAL LOW (ref 8.9–10.3)
Chloride: 109 mmol/L (ref 98–111)
Creatinine, Ser: 1.15 mg/dL (ref 0.61–1.24)
GFR, Estimated: 57 mL/min — ABNORMAL LOW
Glucose, Bld: 115 mg/dL — ABNORMAL HIGH (ref 70–99)
Potassium: 4.5 mmol/L (ref 3.5–5.1)
Sodium: 139 mmol/L (ref 135–145)

## 2024-08-19 LAB — MAGNESIUM: Magnesium: 1.9 mg/dL (ref 1.7–2.4)

## 2024-08-19 NOTE — Plan of Care (Signed)

## 2024-08-19 NOTE — Progress Notes (Signed)
 [facility] Progress Note Patient: Justin Baker FMW:[id number] DOB: [birth year]  DOA: 08/15/2024 DOS: the patient was seen and examined on 08/19/2024  Brief Hospital Course: Patient with PMH of COPD, CVA, SDH, chronic thrombocytopenia, paroxysmal A-fib present to the hospital with complaints of confusion and shortness of breath. Admitted for concern for UTI.  Treated with IM Rocephin  outpatient. Possible pneumonia seen in the chest x-ray. Currently continue with antibiotics. Assessment and Plan: Community-acquired pneumonia. Treated with IV antibiotic IV fluid. Switching to oral antibiotic. Monitor. Cultures are negative  Generalized weakness and deconditioning. Primary reason for presentation from facility. Able to follow commands.  Able to answer questions although not oriented well. Very close to baseline.  Paroxysmal A-fib. Currently rate controlled. Not on any anticoagulation due to SDH and recurrent falls.  Acute metabolic encephalopathy. Likely in the setting of dehydration and UTI.  Now resolved.  Chronic HFpEF. Volume status is adequate.  History of COPD. At present no evidence of exacerbation. Continue current inhalers.  Chronic thrombocytopenia. Monitor. Holding chemical DVT prophylaxis.  CKD 3B. No evidence of AKI. Baseline appears to be around 1.4-1.7. Upon admission serum creatinine was 1.7. Monitor.  HTN. Treated with fluid due to hypotension blood pressure better now.  Monitor.   Subjective: No nausea or vomiting.  No acute complaint.  No diarrhea.  No constipation.  Physical Exam: Faint basal crackles. S1-S2 present abdomen bowel sound present.  Data Reviewed: I have Reviewed nursing notes, Vitals, and Lab results. Disposition: Status is: Inpatient Remains inpatient appropriate because: Awaiting placement.  Place and maintain sequential compression device Start: 08/16/24 0842   Family Communication: No one at bedside.  Discussed with  son on 12/27. Level of care: Med-Surg   Vitals:   08/19/24 0348 08/19/24 0842 08/19/24 1155 08/19/24 1643  BP: 130/86 125/75 126/87 106/73  Pulse: (!) 59 (!) 106 87 100  Resp: 20 18 17 17   Temp: 97.9 F (36.6 C) 97.7 F (36.5 C) 97.9 F (36.6 C) 97.8 F (36.6 C)  TempSrc:   Oral   SpO2: 95% 94% 97% 94%     Author: Yetta Blanch, MD 08/19/2024 6:30 PM  Please look on [email or web address] to find out who is on call.

## 2024-08-19 NOTE — Plan of Care (Signed)
  Problem: Pain Managment: Goal: General experience of comfort will improve and/or be controlled Outcome: Progressing   Problem: Safety: Goal: Ability to remain free from injury will improve Outcome: Progressing   Problem: Skin Integrity: Goal: Risk for impaired skin integrity will decrease Outcome: Progressing

## 2024-08-20 DIAGNOSIS — R531 Weakness: Secondary | ICD-10-CM | POA: Diagnosis not present

## 2024-08-20 LAB — CULTURE, BLOOD (ROUTINE X 2)
Culture: NO GROWTH
Culture: NO GROWTH
Special Requests: ADEQUATE

## 2024-08-20 NOTE — TOC Progression Note (Addendum)
 Transition of Care [facility]) - Progression Note    Patient Details  Name: Justin Baker MRN: [id number] Date of Birth: [birth year]  Transition of Care [facility] CM/SW Contact  Sherline Clack, [place] Phone Number: 08/20/2024, 10:49 AM  Clinical Narrative:     Update 4:11 PM: Patient's family is agreeable to patient being reevaluated for PT at the facility after discharge. CSW informed facility family is interested in reevaluating while at the facility. Facility is able to take patient tomorrow. Provider made aware.   Update 12:23 PM: CSW received call from [employer] requesting an updated PT note for patient. CSW was also informed patient was in his copay days and would be starting rehab on day 32. CSW called son [name] to provide update and left VM with callback number.   CSW reached out to family to ask about continuing physical therapy at [facility]. CSW called both sons [name] (wife [name] picked up) and [name]) and confirmed they would like for insurance auth to be submitted for PT follow up at the SNF. CSW got in contact with [name] at [id number] to submit auth and will wait for call back with decision. CSW will continue to follow.   Expected Discharge Plan: Long Term Nursing Home Barriers to Discharge: Continued Medical Work up               Expected Discharge Plan and Services In-house Referral: Clinical Social Work   Post Acute Care Choice: Nursing Home, Skilled Nursing Facility Living arrangements for the past 2 months: Skilled Nursing Facility Expected Discharge Date: 08/20/24                                     Social Drivers of Health (SDOH) Interventions SDOH Screenings   Food Insecurity: Patient Declined (08/16/2024)  Housing: Low Risk (08/18/2024)  Transportation Needs: No Transportation Needs (08/16/2024)  Utilities: Patient Declined (08/16/2024)  Depression (PHQ2-9): Low Risk (08/19/2022)  Financial Resource Strain: Low Risk (02/14/2023)   Physical Activity: Unknown (02/14/2023)  Social Connections: Unknown (08/16/2024)  Stress: No Stress Concern Present (02/14/2023)  Tobacco Use: Medium Risk (08/19/2024)    Readmission Risk Interventions     No data to display

## 2024-08-20 NOTE — Plan of Care (Signed)
" °  Problem: Education: Goal: Knowledge of General Education information will improve Description: Including pain rating scale, medication(s)/side effects and non-pharmacologic comfort measures Outcome: Progressing   Problem: Clinical Measurements: Goal: Ability to maintain clinical measurements within normal limits will improve Outcome: Progressing Goal: Will remain free from infection Outcome: Progressing Goal: Diagnostic test results will improve Outcome: Progressing Goal: Respiratory complications will improve Outcome: Progressing Goal: Cardiovascular complication will be avoided Outcome: Progressing   Problem: Activity: Goal: Risk for activity intolerance will decrease Outcome: Progressing   Problem: Nutrition: Goal: Adequate nutrition will be maintained Outcome: Progressing   Problem: Elimination: Goal: Will not experience complications related to bowel motility Outcome: Progressing Goal: Will not experience complications related to urinary retention Outcome: Progressing   Problem: Safety: Goal: Ability to remain free from injury will improve Outcome: Progressing   Problem: Pain Managment: Goal: General experience of comfort will improve and/or be controlled Outcome: Progressing   Problem: Skin Integrity: Goal: Risk for impaired skin integrity will decrease Outcome: Progressing   Problem: Safety: Goal: Ability to remain free from injury will improve Outcome: Progressing   "

## 2024-08-20 NOTE — Discharge Summary (Signed)
 " Physician Discharge Summary   Patient: Justin Baker MRN: [id number] DOB: 02-12-1922  Admit date:     08/15/2024  Discharge date: 08/20/2024  Discharge Physician: Yetta Blanch  PCP: Delice Redbird  Recommendations at discharge:  Follow up with PCP in 1  week to discuss blood pressure meds    Follow-up Information     PCP. Schedule an appointment as soon as possible for a visit in 1 week(s).                Addendum: Per TOC SNF will be able to take the pt on 08/21/2024.   [facility] Course: Patient with PMH of COPD, CVA, SDH, chronic thrombocytopenia, paroxysmal A-fib present to the hospital with complaints of confusion and shortness of breath. Admitted for concern for UTI.  Treated with IM Rocephin  outpatient. Possible pneumonia seen in the chest x-ray.  Assessment and Plan: Community-acquired pneumonia. Treated with IV antibiotic IV fluid. Switched to oral antibiotic. Work up negative. Completed 5 days of Antibiotics in hospital Cultures are negative  Generalized weakness and deconditioning. Primary reason for presentation from facility. Able to follow commands.  Able to answer questions although not oriented well. Very close to baseline. Could be related to overmedication with metoprolol    Paroxysmal A-fib. Currently rate controlled. Not on any anticoagulation due to SDH and recurrent falls.  Acute metabolic encephalopathy. Likely in the setting of dehydration and UTI.  Now resolved.  Chronic HFpEF. Volume status is adequate. No need for diuresis for now.   History of COPD. At present no evidence of exacerbation. Continue current inhalers.  Chronic thrombocytopenia. Monitor.  CKD 3B. No evidence of AKI. Baseline appears to be around 1.4-1.7. Upon admission serum creatinine was 1.7. creatinine on discharge was 1.15.  HTN. Treated with fluid due to hypotension blood pressure better now.  Monitor. On metoprolol  at home, for now holding.    Consultants:  none  Procedures performed:  none  DISCHARGE MEDICATION: Allergies as of 08/20/2024   No Known Allergies      Medication List     STOP taking these medications    metoprolol  tartrate 50 MG tablet Commonly known as: LOPRESSOR        TAKE these medications    acetaminophen  500 MG tablet Commonly known as: TYLENOL  Take 500 mg by mouth every 6 (six) hours as needed for mild pain (pain score 1-3).   barrier cream Crea Commonly known as: non-specified Apply 1 Application topically in the morning, at noon, and at bedtime. Apply to buttocks every shift   cyanocobalamin  1000 MCG tablet Commonly known as: VITAMIN B12 Take 1,000 mcg by mouth daily.   ferrous sulfate 324 MG Tbec Take 324 mg by mouth daily.   finasteride  5 MG tablet Commonly known as: PROSCAR  Take 5 mg by mouth daily.   Incruse Ellipta  62.5 MCG/ACT Aepb Generic drug: umeclidinium bromide  Inhale 1 puff into the lungs daily.   multivitamin tablet Take 1 tablet by mouth daily.   nitroGLYCERIN  0.4 MG SL tablet Commonly known as: NITROSTAT  Place 0.4 mg under the tongue every 5 (five) minutes as needed for chest pain.   polyethylene glycol 17 g packet Commonly known as: MIRALAX  / GLYCOLAX  Take 17 g by mouth daily.   sennosides-docusate sodium  8.6-50 MG tablet Commonly known as: SENOKOT-S Take 1 tablet by mouth in the morning and at bedtime.   tamsulosin  0.4 MG Caps capsule Commonly known as: FLOMAX  Take 0.4 mg by mouth at bedtime.  Disposition: SNF  Family Communication: no family was present at bedside, at the time of interview. Plan was discussed with son earlier. The pt provided permission to discuss medical plan with the family. Opportunity was given to ask question and all questions were answered satisfactorily.  Diet recommendation: Regular diet  Discharge Exam: Vitals:   08/19/24 1922 08/19/24 2311 08/20/24 0355 08/20/24 0751  BP: 123/87 112/60 123/67 (!) 112/59   Pulse: 72 68 (!) 55 67  Resp: 17 17 17 17   Temp: 97.6 F (36.4 C) 97.7 F (36.5 C) 97.6 F (36.4 C) 97.6 F (36.4 C)  TempSrc: Oral Oral Skin Oral  SpO2: 97% 98% 95% 96%   General: in Mild distress, No Rash Cardiovascular: S1 and S2 Present, No Murmur Respiratory: Good respiratory effort, Bilateral Air entry present. No Crackles, No wheezes Abdomen: Bowel Sound present, No tenderness Extremities: No edema Neuro: Alert and oriented x3, no new focal deficit   Condition at discharge: stable  The results of significant diagnostics from this hospitalization (including imaging, microbiology, ancillary and laboratory) are listed below for reference.   Imaging Studies: DG Chest 2 View Result Date: 08/18/2024 CLINICAL DATA:  Shortness of breath and wheezing. EXAM: CHEST - 2 VIEW COMPARISON:  08/15/2024 FINDINGS: Left-sided pacemaker in place. Stable heart size and mediastinal contours. Elevated left hemidiaphragm. Central bronchial thickening. Coarsened interstitial markings again seen. No confluent opacity, large pleural effusion or pneumothorax. IMPRESSION: 1. Central bronchial thickening. 2. Chronic elevation of left hemidiaphragm. Electronically Signed   By: Andrea Gasman M.D.   On: 08/18/2024 17:03   DG Chest Port 1 View Result Date: 08/15/2024 EXAM: 1 VIEW(S) XRAY OF THE CHEST 08/15/2024 02:17:30 PM COMPARISON: 06/20/2024 CLINICAL HISTORY: Questionable sepsis - evaluate for abnormality FINDINGS: LINES, TUBES AND DEVICES: Left chest wall dual lead cardiac pacemaker in place. LUNGS AND PLEURA: Low lung volumes. Elevated left hemidiaphragm. Coarsened interstitial markings without pulmonary edema. No focal pulmonary opacity. No pleural effusion. No pneumothorax. HEART AND MEDIASTINUM: Aortic calcification. Left chest wall dual lead cardiac pacemaker in place. BONES AND SOFT TISSUES: No acute osseous abnormality. IMPRESSION: 1. No acute cardiopulmonary abnormality. 2. Low lung volumes with  elevated left hemidiaphragm. Electronically signed by: Dayne Hassell MD 08/15/2024 02:49 PM EST RP Workstation: [id number]    Microbiology: Results for orders placed or performed during the hospital encounter of 08/15/24  Blood Culture (routine x 2)     Status: None (Preliminary result)   Collection Time: 08/15/24  1:50 PM   Specimen: BLOOD  Result Value Ref Range Status   Specimen Description BLOOD RIGHT ANTECUBITAL  Final   Special Requests   Final    BOTTLES DRAWN AEROBIC AND ANAEROBIC Blood Culture adequate volume   Culture   Final    NO GROWTH 4 DAYS Performed at [facility], [address], [place], [place] [address]    Report Status PENDING  Incomplete  Blood Culture (routine x 2)     Status: None (Preliminary result)   Collection Time: 08/15/24  2:57 PM   Specimen: BLOOD RIGHT HAND  Result Value Ref Range Status   Specimen Description BLOOD RIGHT HAND  Final   Special Requests   Final    BOTTLES DRAWN AEROBIC AND ANAEROBIC Blood Culture results may not be optimal due to an inadequate volume of blood received in culture bottles   Culture   Final    NO GROWTH 4 DAYS Performed at [facility], [address], [place], [place] [address]  Report Status PENDING  Incomplete  Resp panel by RT-PCR (RSV, Flu A&B, Covid) Anterior Nasal Swab     Status: None   Collection Time: 08/15/24  3:37 PM   Specimen: Anterior Nasal Swab  Result Value Ref Range Status   SARS Coronavirus 2 by RT PCR NEGATIVE NEGATIVE Final   Influenza A by PCR NEGATIVE NEGATIVE Final   Influenza B by PCR NEGATIVE NEGATIVE Final    Comment: (NOTE) The Xpert Xpress SARS-CoV-2/FLU/RSV plus assay is intended as an aid in the diagnosis of influenza from Nasopharyngeal swab specimens and should not be used as a sole basis for treatment. Nasal washings and aspirates are unacceptable for Xpert Xpress SARS-CoV-2/FLU/RSV testing.  Fact Sheet for  Patients: bloggercourse.com  Fact Sheet for Healthcare Providers: seriousbroker.it  This test is not yet approved or cleared by the [place]  FDA and has been authorized for detection and/or diagnosis of SARS-CoV-2 by FDA under an Emergency Use Authorization (EUA). This EUA will remain in effect (meaning this test can be used) for the duration of the COVID-19 declaration under Section 564(b)(1) of the Act, 21 U.S.C. section 360bbb-3(b)(1), unless the authorization is terminated or revoked.     Resp Syncytial Virus by PCR NEGATIVE NEGATIVE Final    Comment: (NOTE) Fact Sheet for Patients: bloggercourse.com  Fact Sheet for Healthcare Providers: seriousbroker.it  This test is not yet approved or cleared by the [place]  FDA and has been authorized for detection and/or diagnosis of SARS-CoV-2 by FDA under an Emergency Use Authorization (EUA). This EUA will remain in effect (meaning this test can be used) for the duration of the COVID-19 declaration under Section 564(b)(1) of the Act, 21 U.S.C. section 360bbb-3(b)(1), unless the authorization is terminated or revoked.  Performed at [facility], [address], [place], [place] [address]    Labs: CBC: Recent Labs  Lab 08/15/24 1406 08/15/24 1413 08/15/24 2237 08/16/24 0441 08/17/24 1207 08/18/24 0545  WBC 7.6  --  7.8 8.5 5.4 5.0  NEUTROABS 4.6  --   --   --  2.7  --   HGB 13.2 13.3 12.6* 12.8* 12.8* 12.6*  HCT 40.1 39.0 38.0* 38.5* 38.5* 36.8*  MCV 101.5*  --  101.3* 100.5* 100.3* 99.5  PLT 108*  --  93* 91* 82* 84*   Basic Metabolic Panel: Recent Labs  Lab 08/15/24 1406 08/15/24 1413 08/15/24 2237 08/16/24 0441 08/17/24 1207 08/18/24 0545 08/19/24 1155  NA 136 139  --  137 135 139 139  K 4.2 4.1  --  4.2 4.4 4.4 4.5  CL 104 105  --  105 105 109 109  CO2 22  --   --  21* 20* 22 22  GLUCOSE 120*  120*  --  92 100* 104* 115*  BUN 30* 30*  --  29* 27* 29* 27*  CREATININE 1.64* 1.70* 1.51* 1.49* 1.39* 1.30* 1.15  CALCIUM  8.5*  --   --  8.4* 8.1* 7.8* 8.3*  MG  --   --   --   --  1.8 1.9 1.9   Liver Function Tests: Recent Labs  Lab 08/15/24 1406 08/16/24 0441 08/17/24 1207  AST 28 26 29   ALT 14 11 14   ALKPHOS 75 68 71  BILITOT 0.5 0.5 0.5  PROT 6.1* 5.7* 5.8*  ALBUMIN  3.4* 3.1* 3.0*   CBG: No results for input(s): GLUCAP in the last 168 hours.  Discharge time spent: 35 minutes  Author: Yetta Blanch, MD  [facility]    "

## 2024-08-20 NOTE — Care Management Important Message (Signed)
 Important Message  Patient Details  Name: Justin Baker MRN: [id number] Date of Birth: 02-Dec-1921   Important Message Given:  Yes - Medicare IM     Claretta Deed 08/20/2024, 2:28 PM

## 2024-08-21 DIAGNOSIS — R531 Weakness: Secondary | ICD-10-CM | POA: Diagnosis not present

## 2024-08-21 NOTE — Plan of Care (Signed)

## 2024-08-21 NOTE — Telephone Encounter (Signed)
 Patient for PPM generator change on Jan 8. Noted he was admitted to [facility] Dec 24- 30 and treated for PNA, UTI, generalized weakness and deconditioning. He has PMH of chronic thrombocytopenia, with last PLT count of 84. He has had multiple admissions for sepsis since last OV- June 2025. Device at [facility]- dependent on 07/02/24. Please advise if OK to proceed as scheduled.

## 2024-08-21 NOTE — TOC Transition Note (Signed)
 Transition of Care [facility]) - Discharge Note   Patient Details  Name: Justin Baker MRN: [id number] Date of Birth: May 09, 1922  [facility] CM/SW Contact:  Sherline Clack, LCSWA Phone Number: 08/21/2024, 10:56 AM   Clinical Narrative:     Patient will DC to: [facility] Anticipated DC date: 08/21/2024  Family notified: Lynwood Jr/son Transport by: [place]   Per MD patient ready for DC to [facility]. RN to call report prior to discharge [phone], [phone]). RN, patient, patient's family, and facility notified of DC. Discharge Summary and FL2 sent to facility. DC packet on chart. Ambulance transport requested for patient.   CSW will sign off for now as social work intervention is no longer needed. Please consult us  again if new needs arise.    Final next level of care: Long Term Nursing Home Barriers to Discharge: Barriers Resolved   Patient Goals and CMS Choice            Discharge Placement              Patient chooses bed at: [facility] [place] Patient to be transferred to facility by: [facility] Name of family member notified: Kesler Wickham Jr/son: [phone] Patient and family notified of of transfer: 08/21/24  Discharge Plan and Services Additional resources added to the After Visit Summary for   In-house Referral: Clinical Social Work   Post Acute Care Choice: Nursing Home, Skilled Nursing Facility                               Social Drivers of Health (SDOH) Interventions SDOH Screenings   Food Insecurity: Patient Declined (08/16/2024)  Housing: Low Risk (08/18/2024)  Transportation Needs: No Transportation Needs (08/16/2024)  Utilities: Patient Declined (08/16/2024)  Depression (PHQ2-9): Low Risk (08/19/2022)  Financial Resource Strain: Low Risk (02/14/2023)  Physical Activity: Unknown (02/14/2023)  Social Connections: Unknown (08/16/2024)  Stress: No Stress Concern Present (02/14/2023)  Tobacco Use: Medium Risk  (08/19/2024)     Readmission Risk Interventions     No data to display

## 2024-08-21 NOTE — Discharge Summary (Addendum)
 " Physician Discharge Summary   Patient: Justin Baker MRN: [id number] DOB: 01/10/1922  Admit date:     08/15/2024  Discharge date: 08/21/2024  Discharge Physician: Yetta Blanch  PCP: Delice Redbird  Recommendations at discharge:  Follow up with PCP in 1  week to discuss blood pressure meds    Follow-up Information     PCP. Schedule an appointment as soon as possible for a visit in 1 week(s).                 Hospital Course: Patient with PMH of COPD, CVA, SDH, chronic thrombocytopenia, paroxysmal A-fib present to the hospital with complaints of confusion and shortness of breath. Admitted for concern for UTI.  Treated with IM Rocephin  outpatient. Possible pneumonia seen in the chest x-ray.  Assessment and Plan: Community-acquired pneumonia. Treated with IV antibiotic IV fluid. Switched to oral antibiotic. Work up negative. Completed 5 days of Antibiotics in hospital Cultures are negative  Generalized weakness and deconditioning. Primary reason for presentation from facility. Able to follow commands.  Able to answer questions although not oriented well. Very close to baseline. Could be related to overmedication with metoprolol    Paroxysmal A-fib. Currently rate controlled. Not on any anticoagulation due to SDH and recurrent falls.  Acute metabolic encephalopathy. Likely in the setting of dehydration and UTI.  Now resolved.  Chronic HFpEF. Volume status is adequate. No need for diuresis for now.   History of COPD. At present no evidence of exacerbation. Continue current inhalers.  Chronic thrombocytopenia. Monitor.  CKD 3B. No evidence of AKI. Baseline appears to be around 1.4-1.7. Upon admission serum creatinine was 1.7. creatinine on discharge was 1.15.  HTN. Treated with fluid due to hypotension blood pressure better now.  Monitor. On metoprolol  at home, for now holding.   Consultants:  none  Procedures performed:  none  DISCHARGE  MEDICATION: Allergies as of 08/21/2024   No Known Allergies      Medication List     STOP taking these medications    metoprolol  tartrate 50 MG tablet Commonly known as: LOPRESSOR        TAKE these medications    acetaminophen  500 MG tablet Commonly known as: TYLENOL  Take 500 mg by mouth every 6 (six) hours as needed for mild pain (pain score 1-3).   barrier cream Crea Commonly known as: non-specified Apply 1 Application topically in the morning, at noon, and at bedtime. Apply to buttocks every shift   cyanocobalamin  1000 MCG tablet Commonly known as: VITAMIN B12 Take 1,000 mcg by mouth daily.   ferrous sulfate 324 MG Tbec Take 324 mg by mouth daily.   finasteride  5 MG tablet Commonly known as: PROSCAR  Take 5 mg by mouth daily.   Incruse Ellipta  62.5 MCG/ACT Aepb Generic drug: umeclidinium bromide  Inhale 1 puff into the lungs daily.   multivitamin tablet Take 1 tablet by mouth daily.   nitroGLYCERIN  0.4 MG SL tablet Commonly known as: NITROSTAT  Place 0.4 mg under the tongue every 5 (five) minutes as needed for chest pain.   polyethylene glycol 17 g packet Commonly known as: MIRALAX  / GLYCOLAX  Take 17 g by mouth daily.   sennosides-docusate sodium  8.6-50 MG tablet Commonly known as: SENOKOT-S Take 1 tablet by mouth in the morning and at bedtime.   tamsulosin  0.4 MG Caps capsule Commonly known as: FLOMAX  Take 0.4 mg by mouth at bedtime.       Disposition: SNF  Family Communication: no family was present at bedside, at the time of interview.  Plan was discussed with son earlier. The pt provided permission to discuss medical plan with the family. Opportunity was given to ask question and all questions were answered satisfactorily.  Diet recommendation: Regular diet  Discharge Exam: Vitals:   08/20/24 2014 08/21/24 0021 08/21/24 0433 08/21/24 0541  BP: 102/63 108/69 (!) 113/50 112/72  Pulse: 60 (!) 101 (!) 123 (!) 125  Resp:  16 17   Temp: 98.1 F  (36.7 C) 97.7 F (36.5 C) 97.6 F (36.4 C)   TempSrc:      SpO2: 95% 95% 91% 94%  Weight:      Height:       Condition at discharge: stable  The results of significant diagnostics from this hospitalization (including imaging, microbiology, ancillary and laboratory) are listed below for reference.   Imaging Studies: DG Chest 2 View Result Date: 08/18/2024 CLINICAL DATA:  Shortness of breath and wheezing. EXAM: CHEST - 2 VIEW COMPARISON:  08/15/2024 FINDINGS: Left-sided pacemaker in place. Stable heart size and mediastinal contours. Elevated left hemidiaphragm. Central bronchial thickening. Coarsened interstitial markings again seen. No confluent opacity, large pleural effusion or pneumothorax. IMPRESSION: 1. Central bronchial thickening. 2. Chronic elevation of left hemidiaphragm. Electronically Signed   By: Andrea Gasman M.D.   On: 08/18/2024 17:03   DG Chest Port 1 View Result Date: 08/15/2024 EXAM: 1 VIEW(S) XRAY OF THE CHEST 08/15/2024 02:17:30 PM COMPARISON: 06/20/2024 CLINICAL HISTORY: Questionable sepsis - evaluate for abnormality FINDINGS: LINES, TUBES AND DEVICES: Left chest wall dual lead cardiac pacemaker in place. LUNGS AND PLEURA: Low lung volumes. Elevated left hemidiaphragm. Coarsened interstitial markings without pulmonary edema. No focal pulmonary opacity. No pleural effusion. No pneumothorax. HEART AND MEDIASTINUM: Aortic calcification. Left chest wall dual lead cardiac pacemaker in place. BONES AND SOFT TISSUES: No acute osseous abnormality. IMPRESSION: 1. No acute cardiopulmonary abnormality. 2. Low lung volumes with elevated left hemidiaphragm. Electronically signed by: Dayne Hassell MD 08/15/2024 02:49 PM EST RP Workstation: [id number]    Microbiology: Results for orders placed or performed during the hospital encounter of 08/15/24  Blood Culture (routine x 2)     Status: None   Collection Time: 08/15/24  1:50 PM   Specimen: BLOOD  Result Value Ref Range Status    Specimen Description BLOOD RIGHT ANTECUBITAL  Final   Special Requests   Final    BOTTLES DRAWN AEROBIC AND ANAEROBIC Blood Culture adequate volume   Culture   Final    NO GROWTH 5 DAYS Performed at [facility], [address] [place], [place] [address]    Report Status 08/20/2024 FINAL  Final  Blood Culture (routine x 2)     Status: None   Collection Time: 08/15/24  2:57 PM   Specimen: BLOOD RIGHT HAND  Result Value Ref Range Status   Specimen Description BLOOD RIGHT HAND  Final   Special Requests   Final    BOTTLES DRAWN AEROBIC AND ANAEROBIC Blood Culture results may not be optimal due to an inadequate volume of blood received in culture bottles   Culture   Final    NO GROWTH 5 DAYS Performed at [facility], [address], [place], [place] [address]    Report Status 08/20/2024 FINAL  Final  Resp panel by RT-PCR (RSV, Flu A&B, Covid) Anterior Nasal Swab     Status: None   Collection Time: 08/15/24  3:37 PM   Specimen: Anterior Nasal Swab  Result Value Ref Range Status   SARS Coronavirus 2 by RT PCR  NEGATIVE NEGATIVE Final   Influenza A by PCR NEGATIVE NEGATIVE Final   Influenza B by PCR NEGATIVE NEGATIVE Final    Comment: (NOTE) The Xpert Xpress SARS-CoV-2/FLU/RSV plus assay is intended as an aid in the diagnosis of influenza from Nasopharyngeal swab specimens and should not be used as a sole basis for treatment. Nasal washings and aspirates are unacceptable for Xpert Xpress SARS-CoV-2/FLU/RSV testing.  Fact Sheet for Patients: bloggercourse.com  Fact Sheet for Healthcare Providers: seriousbroker.it  This test is not yet approved or cleared by the [place]  FDA and has been authorized for detection and/or diagnosis of SARS-CoV-2 by FDA under an Emergency Use Authorization (EUA). This EUA will remain in effect (meaning this test can be used) for the duration of the COVID-19 declaration under Section  564(b)(1) of the Act, 21 U.S.C. section 360bbb-3(b)(1), unless the authorization is terminated or revoked.     Resp Syncytial Virus by PCR NEGATIVE NEGATIVE Final    Comment: (NOTE) Fact Sheet for Patients: bloggercourse.com  Fact Sheet for Healthcare Providers: seriousbroker.it  This test is not yet approved or cleared by the [place]  FDA and has been authorized for detection and/or diagnosis of SARS-CoV-2 by FDA under an Emergency Use Authorization (EUA). This EUA will remain in effect (meaning this test can be used) for the duration of the COVID-19 declaration under Section 564(b)(1) of the Act, 21 U.S.C. section 360bbb-3(b)(1), unless the authorization is terminated or revoked.  Performed at [facility], [address], [place], [place] [address]    Labs: CBC: Recent Labs  Lab 08/15/24 1406 08/15/24 1413 08/15/24 2237 08/16/24 0441 08/17/24 1207 08/18/24 0545  WBC 7.6  --  7.8 8.5 5.4 5.0  NEUTROABS 4.6  --   --   --  2.7  --   HGB 13.2 13.3 12.6* 12.8* 12.8* 12.6*  HCT 40.1 39.0 38.0* 38.5* 38.5* 36.8*  MCV 101.5*  --  101.3* 100.5* 100.3* 99.5  PLT 108*  --  93* 91* 82* 84*   Basic Metabolic Panel: Recent Labs  Lab 08/15/24 1406 08/15/24 1413 08/15/24 2237 08/16/24 0441 08/17/24 1207 08/18/24 0545 08/19/24 1155  NA 136 139  --  137 135 139 139  K 4.2 4.1  --  4.2 4.4 4.4 4.5  CL 104 105  --  105 105 109 109  CO2 22  --   --  21* 20* 22 22  GLUCOSE 120* 120*  --  92 100* 104* 115*  BUN 30* 30*  --  29* 27* 29* 27*  CREATININE 1.64* 1.70* 1.51* 1.49* 1.39* 1.30* 1.15  CALCIUM  8.5*  --   --  8.4* 8.1* 7.8* 8.3*  MG  --   --   --   --  1.8 1.9 1.9   Liver Function Tests: Recent Labs  Lab 08/15/24 1406 08/16/24 0441 08/17/24 1207  AST 28 26 29   ALT 14 11 14   ALKPHOS 75 68 71  BILITOT 0.5 0.5 0.5  PROT 6.1* 5.7* 5.8*  ALBUMIN  3.4* 3.1* 3.0*   CBG: No results for input(s): GLUCAP  in the last 168 hours.  Discharge time spent: 35 minutes  Author: Yetta Blanch, MD  [facility]    "

## 2024-08-29 NOTE — Pre-Procedure Instructions (Addendum)
 Spoke with Justin Baker at [facility].  Reviewed the following instructions with her.   Arrival time 1:00 May have a cup of black coffee before 0700 Responsible person to drive you home and stay with you for 24 hrs Wash with special soap night before and morning of procedure  Patient's son said he would be here with the patient tomorrow.

## 2024-08-30 ENCOUNTER — Encounter (HOSPITAL_COMMUNITY)
Admission: RE | Disposition: A | Discharge status: Home / Self Care | Payer: Self-pay | Source: Home / Self Care | Attending: Cardiology

## 2024-08-30 ENCOUNTER — Other Ambulatory Visit: Payer: Self-pay

## 2024-08-30 ENCOUNTER — Other Ambulatory Visit (HOSPITAL_COMMUNITY): Payer: Self-pay | Admitting: Cardiology

## 2024-08-30 ENCOUNTER — Ambulatory Visit (HOSPITAL_COMMUNITY)
Admission: RE | Admit: 2024-08-30 | Discharge: 2024-08-30 | Disposition: A | Discharge status: Home / Self Care | Attending: Cardiology | Admitting: Cardiology

## 2024-08-30 DIAGNOSIS — J449 Chronic obstructive pulmonary disease, unspecified: Secondary | ICD-10-CM | POA: Insufficient documentation

## 2024-08-30 DIAGNOSIS — Z45010 Encounter for checking and testing of cardiac pacemaker pulse generator [battery]: Secondary | ICD-10-CM

## 2024-08-30 DIAGNOSIS — Z8673 Personal history of transient ischemic attack (TIA), and cerebral infarction without residual deficits: Secondary | ICD-10-CM | POA: Diagnosis not present

## 2024-08-30 DIAGNOSIS — I4891 Unspecified atrial fibrillation: Secondary | ICD-10-CM | POA: Insufficient documentation

## 2024-08-30 DIAGNOSIS — I495 Sick sinus syndrome: Secondary | ICD-10-CM | POA: Diagnosis not present

## 2024-08-30 HISTORY — PX: PPM GENERATOR CHANGEOUT: EP1233

## 2024-08-30 MED ORDER — LIDOCAINE HCL (PF) 1 % IJ SOLN
INTRAMUSCULAR | Status: AC
  Filled 2024-08-30: qty 60

## 2024-08-30 MED ORDER — CEFAZOLIN SODIUM-DEXTROSE 2-4 GM/100ML-% IV SOLN
INTRAVENOUS | Status: AC
  Administered 2024-08-30: 2 g via INTRAVENOUS
  Filled 2024-08-30: qty 100

## 2024-08-30 MED ORDER — CHLORHEXIDINE GLUCONATE 4 % EX SOLN: 60.0000 mL | Freq: Once | CUTANEOUS | Status: DC

## 2024-08-30 MED ORDER — SODIUM CHLORIDE 0.9 % IV SOLN: INTRAVENOUS | Status: DC

## 2024-08-30 MED ORDER — LIDOCAINE HCL (PF) 1 % IJ SOLN
INTRAMUSCULAR | Status: DC | PRN
  Administered 2024-08-30: 60 mL

## 2024-08-30 MED ORDER — POVIDONE-IODINE 10 % EX SWAB
2.0000 | Freq: Once | CUTANEOUS | Status: AC
  Administered 2024-08-30: 2 via TOPICAL

## 2024-08-30 MED ORDER — ACETAMINOPHEN 325 MG PO TABS: 325.0000 mg | ORAL_TABLET | ORAL | Status: DC | PRN

## 2024-08-30 MED ORDER — SODIUM CHLORIDE 0.9 % IV SOLN: 80.0000 mg | INTRAVENOUS | Status: AC

## 2024-08-30 MED ORDER — CEFAZOLIN SODIUM-DEXTROSE 2-4 GM/100ML-% IV SOLN: 2.0000 g | INTRAVENOUS | Status: AC

## 2024-08-30 MED ORDER — SODIUM CHLORIDE 0.9 % IV SOLN
INTRAVENOUS | Status: AC
  Administered 2024-08-30: 80 mg
  Filled 2024-08-30: qty 2

## 2024-08-30 MED ORDER — ONDANSETRON HCL 4 MG/2ML IJ SOLN: 4.0000 mg | Freq: Four times a day (QID) | INTRAMUSCULAR | Status: DC | PRN

## 2024-08-30 NOTE — Discharge Instructions (Signed)

## 2024-08-30 NOTE — H&P (Signed)
" °  Electrophysiology Office Note:   Date:  08/30/2024  ID:  Justin Baker, DOB 1922/08/21, MRN [id number]  Primary Cardiologist: Maude Emmer, MD Primary Heart Failure: None Electrophysiologist: Tyechia Allmendinger Gladis Norton, MD      History of Present Illness:   Justin Baker is a 89 y.o. male with h/o atrial fibrillation, COPD, CVA, tachybradycardia syndrome, thrombocytopenia seen today for routine electrophysiology followup.   Today, denies symptoms of palpitations, chest pain, dyspnea, orthopnea, PND, lower extremity edema, claudication, dizziness, presyncope, syncope, bleeding, or neurologic sequela. The patient is tolerating medications without difficulties. Plan gen change today.  EP Information / Studies Reviewed:    EKG is not ordered today. EKG from 10/19/23 reviewed which showed AV paced      PPM Interrogation-  reviewed in detail today,  See PACEART report.  Device History: Abbott Dual Chamber PPM implanted 11/12/2015 for Tachy-Brady syndrome  Risk Assessment/Calculations:    CHA2DS2-VASc Score = 4   This indicates a 4.8% annual risk of stroke. The patient's score is based upon: CHF History: 0 HTN History: 0 Diabetes History: 0 Stroke History: 2 Vascular Disease History: 0 Age Score: 2 Gender Score: 0            Physical Exam:   VS:  BP (!) 131/59   Pulse 86   Temp 97.9 F (36.6 C) (Oral)   Resp 17   Ht 5' 9 (1.753 m)   Wt 81.6 kg   SpO2 98%   BMI 26.58 kg/m    Wt Readings from Last 3 Encounters:  08/30/24 81.6 kg  08/20/24 82.6 kg  06/26/24 82.6 kg    GEN: Well nourished, well developed in no acute distress NECK: No JVD; No carotid bruits CARDIAC: Regular rate and rhythm, no murmurs, rubs, gallops RESPIRATORY:  Clear to auscultation without rales, wheezing or rhonchi  ABDOMEN: Soft, non-tender, non-distended EXTREMITIES:  No edema; No deformity    ASSESSMENT AND PLAN:    Tachy-Brady syndrome s/p Abbott PPM  Rawlin Reaume has presented today for surgery,  with the diagnosis of pacmaker ERI.  The various methods of treatment have been discussed with the patient and family. After consideration of risks, benefits and other options for treatment, the patient has consented to  Procedure(s): Pacemaker gen change as a surgical intervention .  Risks include but not limited to bleeding, infection, pneumothorax, perforation, tamponade, vascular damage, renal failure, MI, stroke, death, and lead dislodgement . The patient's history has been reviewed, patient examined, no change in status, stable for surgery.  I have reviewed the patient's chart and labs.  Questions were answered to the patient's satisfaction.    Soyla Norton, MD 08/30/2024 2:01 PM   Signed, Zephan Beauchaine Gladis Norton, MD  "

## 2024-08-30 NOTE — Progress Notes (Signed)
 Pt and son received discharge instructions, teach back performed. IV removed, no complications. Left chest site is clean dry intact, site is soft, no signs of bleeding. Pt escorted out via wheelchair to nursing home transport.

## 2024-08-31 ENCOUNTER — Encounter (HOSPITAL_COMMUNITY): Payer: Self-pay | Admitting: Cardiology

## 2024-09-06 ENCOUNTER — Encounter

## 2024-09-09 ENCOUNTER — Ambulatory Visit

## 2024-09-10 ENCOUNTER — Encounter

## 2024-09-12 ENCOUNTER — Ambulatory Visit

## 2024-09-12 DIAGNOSIS — I495 Sick sinus syndrome: Secondary | ICD-10-CM

## 2024-09-12 NOTE — Patient Instructions (Signed)
? ?  After Your Pacemaker ? ? ?Monitor your pacemaker site for redness, swelling, and drainage. Call the device clinic at [phone] if you experience these symptoms or fever/chills. ? ?Your incision was closed with Steri-strips or staples:  You may shower 7 days after your procedure and wash your incision with soap and water. Avoid lotions, ointments, or perfumes over your incision until it is well-healed. ? ?You may use a hot tub or a pool after your wound check appointment if the incision is completely closed. ? ? ?Remote monitoring is used to monitor your pacemaker from home. This monitoring is scheduled every 91 days by our office. It allows [place] to keep an eye on the functioning of your device to ensure it is working properly. You will routinely see your Electrophysiologist annually (more often if necessary).  ?

## 2024-09-12 NOTE — Progress Notes (Signed)
 Normal dual chamber pacemaker wound check. Presenting rhythm: AF/VS. Wound well healed. Routine testing performed. Thresholds, sensing, and impedance consistent with implant measurements. AT/AF burden 100%. No episodes. Pt enrolled in remote follow-up.

## 2024-09-26 ENCOUNTER — Encounter

## 2024-10-08 ENCOUNTER — Encounter

## 2024-10-10 ENCOUNTER — Ambulatory Visit

## 2024-10-11 ENCOUNTER — Encounter

## 2024-10-11 ENCOUNTER — Ambulatory Visit

## 2024-11-10 ENCOUNTER — Ambulatory Visit

## 2024-11-29 ENCOUNTER — Ambulatory Visit: Admitting: Student

## 2024-12-26 ENCOUNTER — Encounter

## 2025-01-10 ENCOUNTER — Ambulatory Visit

## 2025-03-27 ENCOUNTER — Encounter

## 2025-04-11 ENCOUNTER — Ambulatory Visit

## 2025-07-11 ENCOUNTER — Ambulatory Visit

## 2025-10-10 ENCOUNTER — Ambulatory Visit
